# Patient Record
Sex: Female | Born: 1995 | Race: White | Hispanic: No | Smoking: Never smoker
Health system: Southern US, Community
[De-identification: ages and names within clinical notes are randomized; demographics above are authoritative.]

## PROBLEM LIST (undated history)

## (undated) DIAGNOSIS — N200 Calculus of kidney: Secondary | ICD-10-CM

## (undated) DIAGNOSIS — F319 Bipolar disorder, unspecified: Secondary | ICD-10-CM

## (undated) DIAGNOSIS — I499 Cardiac arrhythmia, unspecified: Secondary | ICD-10-CM

## (undated) HISTORY — DX: Bipolar disorder, unspecified: F31.9

---
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
## 2016-01-05 ENCOUNTER — Encounter: Payer: Self-pay | Admitting: Family Medicine

## 2016-01-05 ENCOUNTER — Ambulatory Visit (INDEPENDENT_AMBULATORY_CARE_PROVIDER_SITE_OTHER): Payer: BLUE CROSS/BLUE SHIELD | Admitting: Family Medicine

## 2016-01-05 VITALS — BP 116/65 | HR 92 | Resp 16 | Wt 270.0 lb

## 2016-01-05 DIAGNOSIS — I491 Atrial premature depolarization: Secondary | ICD-10-CM

## 2016-01-05 DIAGNOSIS — G4700 Insomnia, unspecified: Secondary | ICD-10-CM

## 2016-01-05 DIAGNOSIS — F411 Generalized anxiety disorder: Secondary | ICD-10-CM

## 2016-01-05 DIAGNOSIS — I493 Ventricular premature depolarization: Secondary | ICD-10-CM

## 2016-01-05 DIAGNOSIS — F339 Major depressive disorder, recurrent, unspecified: Secondary | ICD-10-CM | POA: Diagnosis not present

## 2016-01-05 MED ORDER — ALPRAZOLAM 0.5 MG PO TABS: 0.2500 mg | ORAL_TABLET | Freq: Two times a day (BID) | ORAL | 1 refills | Status: DC | PRN

## 2016-01-05 MED ORDER — METOPROLOL SUCCINATE ER 25 MG PO TB24: 25.0000 mg | ORAL_TABLET | Freq: Every day | ORAL | 1 refills | Status: DC

## 2016-01-05 MED ORDER — SUVOREXANT 15 MG PO TABS: 15.0000 mg | ORAL_TABLET | Freq: Every day | ORAL | 1 refills | Status: DC

## 2016-01-05 MED ORDER — ESCITALOPRAM OXALATE 10 MG PO TABS: 10.0000 mg | ORAL_TABLET | Freq: Every day | ORAL | 1 refills | Status: DC

## 2016-01-05 NOTE — Progress Notes (Addendum)
Subjective:    CC: F/U of depression/anxiety, 3 mo  HPI:  Depression/anxiety-PHQ 9 score of 5 today, previous of 10. GAD 7 score 4, previous of 9. Rates her symptoms as not difficult at all. He is currently on Lexapro. We switched her back in April. She does still use alprazolam occasionally as needed for panic attacks.   Insomnia - tried trazodone in the past and had side effects. She is actually doing well on the Belsomra. She says occasionally she'll still have a night where she doesn't sleep well. She is still consuming some caffeine and has not completely gotten rid of it.  She still having some frequent palpitations. She said she may go week and it doesn't bother her and it seems to recur multiple times throughout the day for couple of days. The symptoms are most seem to cluster. She says it then amiss feels like a pressure in her chest..  She did want me to check her ears and throat. She actually [facility] and was treated for sinus infection and right ear infection. Unfortunately she did not read the instructions correctly on the bottle so was only taking her amoxicillin once a day instead of twice a day. She actually is feeling much better but just wanted me to make sure that everything looks like it's improving.  Past medical history, Surgical history, Family history not pertinant except as noted below, Social history, Allergies, and medications have been entered into the medical record, reviewed, and corrections made.   Review of Systems: No fevers, chills, night sweats, weight loss, chest pain, or shortness of breath.   Objective:    General: Well Developed, well nourished, and in no acute distress.  Neuro: Alert and oriented x3, extra-ocular muscles intact, sensation grossly intact.  HEENT: Normocephalic, atraumatic, OP is clear, TMs and canals are clear bilaterally that she is missing her light reflex on the right tympanic membrane. No cervical  lymphadenopathy. Skin: Warm and dry, no rashes. Cardiac: Regular rate and rhythm, no murmurs rubs or gallops, no lower extremity edema.  Respiratory: Clear to auscultation bilaterally. Not using accessory muscles, speaking in full sentences.   Impression and Recommendations:   Depression/Anxiety - encouraged her to get established with a Counselor/therapist when she gets up to the [employer] in a couple weeks to start college. I think this will be helpful for her throughout the year. Continue with Lexapro. Strongly encouraged her to consider regular meditation. She says she is going to take a yoga class this fall. GAD-7 score of 4, down from 9.  PHQ-9 score 5, down from 10.    Insomnia - continue with all summer. Reminded her once again to try avoiding all for caffeine. Encouraged her to give it a trial for at least a couple weeks to see if she notices a difference.  Palpitations-we'll start metoprolol 25 mg daily. Discussed potential risks and benefits of the medication including side effects. Follow-up when she is back home for winter break.  [place] at [employer].

## 2016-02-13 DIAGNOSIS — R05 Cough: Secondary | ICD-10-CM | POA: Diagnosis not present

## 2016-02-20 ENCOUNTER — Ambulatory Visit (INDEPENDENT_AMBULATORY_CARE_PROVIDER_SITE_OTHER): Payer: BLUE CROSS/BLUE SHIELD | Admitting: Osteopathic Medicine

## 2016-02-20 ENCOUNTER — Encounter: Payer: Self-pay | Admitting: Osteopathic Medicine

## 2016-02-20 VITALS — BP 136/76 | HR 83 | Temp 98.6°F | Ht 66.0 in | Wt 272.0 lb

## 2016-02-20 DIAGNOSIS — J0100 Acute maxillary sinusitis, unspecified: Secondary | ICD-10-CM | POA: Diagnosis not present

## 2016-02-20 MED ORDER — DOXYCYCLINE MONOHYDRATE 50 MG PO CAPS: 100.0000 mg | ORAL_CAPSULE | Freq: Two times a day (BID) | ORAL | 0 refills | Status: DC

## 2016-02-20 NOTE — Progress Notes (Signed)
HPI: Tonya EdwardsMichaela Clark is a 20 y.o. female who [facility] [place]  today for chief complaint of:  Chief Complaint  Patient presents with  . Cough    Acute Illness: . Context: stuffy nose, coming and going now rather than conctant. Hurts in face and teeth. Coughing maybe a bit more but dry cough . Assoc signs/symptoms: see ROS . Duration: 3.5 weeks . Modifying factors: has tried the following OTC/Rx medications: saw [facility] 3 times: first time antibiotics and nasal spray for sinuses, different antibiotics again a week later, then a week later prednisone, then CXR and breathing treatment and she is  alittle better but really congested   Past medical, social and family history reviewed. Current medications and allergies reviewed.     Review of Systems:  Constitutional: no fever/chills  HEENT: yes headache, no vision change or hearing change, some sore throat  Cardiovascular: No chest pain, No pressure/palpitations  Respiratory: yes cough, no shortness of breath  Gastrointestinal: no nausea, no vomiting, no abdominal pain, no diarrhea  Musculoskeletal: no myalgia/arthralgia  Skin/Integument: no rash   Exam:  BP 136/76   Pulse 83   Temp 98.6 F (37 C) (Oral)   Ht 5\' 6"  (1.676 m)   Wt 272 lb (123.4 kg)   BMI 43.90 kg/m   Constitutional: VSS, see above. General Appearance: alert, well-developed, well-nourished, NAD  Eyes: Normal lids and conjunctive, non-icteric sclera, PERRLA  Ears, Nose, Mouth, Throat: Normal external inspection ears/nares/mouth/lips/gums, normal TM, MMM; posterior pharynx without erythema, without exudate, nasal mucosa normal  Neck: No masses, trachea midline. normal lymph nodes  Respiratory: Normal respiratory effort. No  wheeze/rhonchi/rales  Cardiovascular: S1/S2 normal, no murmur/rub/gallop auscultated. RRR.   ASSESSMENT/PLAN: It sounds like she went to student health pretty early in the onset of a  viral illness, Augmentin probably wouldn't have been helpful, my guess is that she developed a bacterial sinus infection that wasn't covered by the Z-Pak she was given. Will go ahead and escalate antibiotics at this point, if still no improvement would consider CT head. Cough most likely post viral illness, can also be postnasal drip. Advised Afrin nasal spray limited use, throat lozenges with benzocaine/menthol.   Acute maxillary sinusitis, recurrence not specified   Patient Instructions  Take all antibiotics as directed. If no better in 5 - 7 days, or if you get worse before then, please call us!       Visit summary was printed for the patient with medications and pertinent instructions for patient to review. ER/RTC precautions reviewed. All questions answered. No Follow-up on file.

## 2016-02-20 NOTE — Patient Instructions (Signed)
Take all antibiotics as directed. If no better in 5 - 7 days, or if you get worse before then, please call us!

## 2016-02-27 DIAGNOSIS — F331 Major depressive disorder, recurrent, moderate: Secondary | ICD-10-CM | POA: Diagnosis not present

## 2016-02-27 DIAGNOSIS — F411 Generalized anxiety disorder: Secondary | ICD-10-CM | POA: Diagnosis not present

## 2016-03-09 DIAGNOSIS — F331 Major depressive disorder, recurrent, moderate: Secondary | ICD-10-CM | POA: Diagnosis not present

## 2016-03-09 DIAGNOSIS — F411 Generalized anxiety disorder: Secondary | ICD-10-CM | POA: Diagnosis not present

## 2016-03-16 DIAGNOSIS — F331 Major depressive disorder, recurrent, moderate: Secondary | ICD-10-CM | POA: Diagnosis not present

## 2016-03-16 DIAGNOSIS — F411 Generalized anxiety disorder: Secondary | ICD-10-CM | POA: Diagnosis not present

## 2016-03-23 DIAGNOSIS — F411 Generalized anxiety disorder: Secondary | ICD-10-CM | POA: Diagnosis not present

## 2016-03-23 DIAGNOSIS — F331 Major depressive disorder, recurrent, moderate: Secondary | ICD-10-CM | POA: Diagnosis not present

## 2016-03-30 DIAGNOSIS — F331 Major depressive disorder, recurrent, moderate: Secondary | ICD-10-CM | POA: Diagnosis not present

## 2016-03-30 DIAGNOSIS — F411 Generalized anxiety disorder: Secondary | ICD-10-CM | POA: Diagnosis not present

## 2016-04-06 DIAGNOSIS — F411 Generalized anxiety disorder: Secondary | ICD-10-CM | POA: Diagnosis not present

## 2016-04-06 DIAGNOSIS — F331 Major depressive disorder, recurrent, moderate: Secondary | ICD-10-CM | POA: Diagnosis not present

## 2016-04-13 DIAGNOSIS — F411 Generalized anxiety disorder: Secondary | ICD-10-CM | POA: Diagnosis not present

## 2016-04-13 DIAGNOSIS — F331 Major depressive disorder, recurrent, moderate: Secondary | ICD-10-CM | POA: Diagnosis not present

## 2016-04-20 DIAGNOSIS — F411 Generalized anxiety disorder: Secondary | ICD-10-CM | POA: Diagnosis not present

## 2016-04-20 DIAGNOSIS — F331 Major depressive disorder, recurrent, moderate: Secondary | ICD-10-CM | POA: Diagnosis not present

## 2016-04-27 DIAGNOSIS — F331 Major depressive disorder, recurrent, moderate: Secondary | ICD-10-CM | POA: Diagnosis not present

## 2016-04-27 DIAGNOSIS — F411 Generalized anxiety disorder: Secondary | ICD-10-CM | POA: Diagnosis not present

## 2016-05-10 ENCOUNTER — Ambulatory Visit: Payer: BLUE CROSS/BLUE SHIELD | Admitting: Family Medicine

## 2016-05-10 ENCOUNTER — Encounter: Payer: Self-pay | Admitting: Family Medicine

## 2016-05-10 ENCOUNTER — Ambulatory Visit (INDEPENDENT_AMBULATORY_CARE_PROVIDER_SITE_OTHER): Payer: BLUE CROSS/BLUE SHIELD | Admitting: Family Medicine

## 2016-05-10 VITALS — BP 133/74 | HR 88 | Wt 278.0 lb

## 2016-05-10 DIAGNOSIS — F339 Major depressive disorder, recurrent, unspecified: Secondary | ICD-10-CM

## 2016-05-10 DIAGNOSIS — G4709 Other insomnia: Secondary | ICD-10-CM | POA: Diagnosis not present

## 2016-05-10 DIAGNOSIS — I493 Ventricular premature depolarization: Secondary | ICD-10-CM

## 2016-05-10 DIAGNOSIS — F411 Generalized anxiety disorder: Secondary | ICD-10-CM

## 2016-05-10 DIAGNOSIS — L918 Other hypertrophic disorders of the skin: Secondary | ICD-10-CM

## 2016-05-10 MED ORDER — ESCITALOPRAM OXALATE 20 MG PO TABS: 20.0000 mg | ORAL_TABLET | Freq: Every day | ORAL | 1 refills | Status: DC

## 2016-05-10 NOTE — Progress Notes (Signed)
Subjective:    CC: GAD  HPI: Four-month follow-up for depression and anxiety-she's currently on Lexapro and has been on it since April of this year. She does use alprazolam as needed for panic attacks. She is in college and being. She says last semester she made A's and B's and 1C and a class. She has been seeing a psychologist there. That she feels like she is a little on. She does admit that she has been having some newer symptoms that she had not previously discussed including some OCD type behaviors. Checking unlocking doors, showering twice a day, eating only certain foods on certain days because she feels like something bad may happen if she eats them on other days. She's also been having some paranoid thoughts that people are after her because she has some knowledge in her head but they do not have. She admits she's occasionally been hearing voices in her brain amiss like they are discussing intubating each other. But she feels like they are not her voices.  Insomnia-still using the Belsomra occasionally. She says it does work very well when she takes it but admits she is not very consistent with it.  Palpitations-we started metoprolol 25 mg 4 months ago.She does feel like it's been helping with her palpitations that she still gets an occasional episode.  The has a skin tag under her left axilla.  She says she squeezed white stuff out of it so wasn't sure if it was really a skin tag or not. She says it's been there since she was little. He normally doesn't bother her.  Past medical history, Surgical history, Family history not pertinant except as noted below, Social history, Allergies, and medications have been entered into the medical record, reviewed, and corrections made.   Review of Systems: No fevers, chills, night sweats, weight loss, chest pain, or shortness of breath.   Objective:    General: Well Developed, well nourished, and in no acute distress.  Neuro: Alert and oriented x3,  extra-ocular muscles intact, sensation grossly intact.  HEENT: Normocephalic, atraumatic  Skin: Warm and dry, no rashes.Under the left axilla she does have a small skin tag. I do see a slightly enlarged pore right next to the skin tag which certainly could be a sebaceous cyst. Cardiac: Regular rate and rhythm, no murmurs rubs or gallops, no lower extremity edema.  Respiratory: Clear to auscultation bilaterally. Not using accessory muscles, speaking in full sentences.   Impression and Recommendations:   Anxiety/Depression - I am concerned now that she is having some more paranoid and OCD type tendencies. I would like to get her in with psychiatry. We discussed this on multiple occasions in the past. She is willing to see Dr. Gilmore LarocheAkhtar downstairs will go ahead and place referral today. For now to try increasing her Lexapro to 20 mg with make which may help some with the OCD but may not help with the paranoid thoughts.  Insomnia - continue Belsomra since it does seem to be effective and is not causing any symptoms.  Palpitations-at this point fairly well controlled with 25 mg metoprolol. But certainly if she feels like she starting to have more frequent breakthrough symptoms we can increase the dose.  Skin tag - he can easily remove this at her convenience. Discussed this with her today.

## 2016-05-27 DIAGNOSIS — M272 Inflammatory conditions of jaws: Secondary | ICD-10-CM | POA: Diagnosis not present

## 2016-05-27 DIAGNOSIS — K0402 Irreversible pulpitis: Secondary | ICD-10-CM | POA: Diagnosis not present

## 2016-06-11 DIAGNOSIS — F411 Generalized anxiety disorder: Secondary | ICD-10-CM | POA: Diagnosis not present

## 2016-06-11 DIAGNOSIS — F422 Mixed obsessional thoughts and acts: Secondary | ICD-10-CM | POA: Diagnosis not present

## 2016-06-11 DIAGNOSIS — F331 Major depressive disorder, recurrent, moderate: Secondary | ICD-10-CM | POA: Diagnosis not present

## 2016-06-17 DIAGNOSIS — M272 Inflammatory conditions of jaws: Secondary | ICD-10-CM | POA: Diagnosis not present

## 2016-06-29 DIAGNOSIS — F331 Major depressive disorder, recurrent, moderate: Secondary | ICD-10-CM | POA: Diagnosis not present

## 2016-06-29 DIAGNOSIS — F422 Mixed obsessional thoughts and acts: Secondary | ICD-10-CM | POA: Diagnosis not present

## 2016-06-29 DIAGNOSIS — F411 Generalized anxiety disorder: Secondary | ICD-10-CM | POA: Diagnosis not present

## 2016-07-07 ENCOUNTER — Other Ambulatory Visit: Payer: Self-pay | Admitting: Family Medicine

## 2016-07-07 DIAGNOSIS — I491 Atrial premature depolarization: Secondary | ICD-10-CM

## 2016-07-07 DIAGNOSIS — I493 Ventricular premature depolarization: Secondary | ICD-10-CM

## 2016-07-11 ENCOUNTER — Other Ambulatory Visit: Payer: Self-pay | Admitting: Family Medicine

## 2016-07-11 DIAGNOSIS — F411 Generalized anxiety disorder: Secondary | ICD-10-CM

## 2016-07-11 DIAGNOSIS — F339 Major depressive disorder, recurrent, unspecified: Secondary | ICD-10-CM

## 2016-07-23 ENCOUNTER — Encounter: Payer: Self-pay | Admitting: Family Medicine

## 2016-07-23 ENCOUNTER — Ambulatory Visit (INDEPENDENT_AMBULATORY_CARE_PROVIDER_SITE_OTHER): Payer: BLUE CROSS/BLUE SHIELD | Admitting: Family Medicine

## 2016-07-23 VITALS — BP 123/53 | HR 78 | Ht 66.0 in | Wt 274.0 lb

## 2016-07-23 DIAGNOSIS — G4709 Other insomnia: Secondary | ICD-10-CM

## 2016-07-23 DIAGNOSIS — I493 Ventricular premature depolarization: Secondary | ICD-10-CM

## 2016-07-23 DIAGNOSIS — F339 Major depressive disorder, recurrent, unspecified: Secondary | ICD-10-CM

## 2016-07-23 DIAGNOSIS — I491 Atrial premature depolarization: Secondary | ICD-10-CM

## 2016-07-23 DIAGNOSIS — F411 Generalized anxiety disorder: Secondary | ICD-10-CM

## 2016-07-23 MED ORDER — FLUCONAZOLE 150 MG PO TABS: 150.0000 mg | ORAL_TABLET | Freq: Once | ORAL | 0 refills | Status: AC

## 2016-07-23 MED ORDER — METOPROLOL SUCCINATE ER 25 MG PO TB24: 25.0000 mg | ORAL_TABLET | Freq: Every day | ORAL | 1 refills | Status: DC

## 2016-07-23 MED ORDER — ESCITALOPRAM OXALATE 20 MG PO TABS: 20.0000 mg | ORAL_TABLET | Freq: Every day | ORAL | 1 refills | Status: DC

## 2016-07-23 NOTE — Progress Notes (Signed)
Subjective:    CC: GAD, MDD  HPI:  F/U GAD/MDD - Overall doing well. 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 I do a lot of community service which she is actually been very excited about. She still taking her medication regularly without any side effects or problems. She's not been sleeping well.  She quit seeing the therapist that she was working with who is very into Astrological signs and treatments for therapy.  Insomnia - She has restarted her Belsomra recently because she hasn't been sleeping well. Typically falling asleep around 3 AM and then getting up at 6 AM. She admits that she does drink a lot of caffeine since she is in college.  PVCs/PACs-she does need a refill on her metoprolol today.  Also c/o of itching and irritation in the groin area x 1.5 weeks.    Past medical history, Surgical history, Family history not pertinant except as noted below, Social history, Allergies, and medications have been entered into the medical record, reviewed, and corrections made.   Review of Systems: No fevers, chills, night sweats, weight loss, chest pain, or shortness of breath.   Objective:    General: Well Developed, well nourished, and in no acute distress.  Neuro: Alert and oriented x3, extra-ocular muscles intact, sensation grossly intact.  HEENT: Normocephalic, atraumatic  Skin: Warm and dry, no rashes. Cardiac: Regular rate and rhythm, no murmurs rubs or gallops, no lower extremity edema.  Respiratory: Clear to auscultation bilaterally. Not using accessory muscles, speaking in full sentences.   Impression and Recommendations:    GAD - Mood is well controlled. Continue current regimen. Follow-up in 4 months. Refills sent to pharmacy.Encouraged her to check with her insurance and find a new there past in the area in between where she lives. She has any problems please let me know if she needs an actual referral please let me know. I think this is very helpful for her in the past  and like to get her hooked in with someone that she can talk to when she is undergoing a lot of stress.  MDD - the note above.  PHQ 9 score of 10 today.  Insomnia - Continue with Belsomra. Also discussed completely eliminating caffeine from the diet. She did start with cutting back and see if this makes a difference.  PACs - refilled metoprolol today.

## 2016-07-29 ENCOUNTER — Telehealth: Payer: Self-pay | Admitting: *Deleted

## 2016-07-29 NOTE — Telephone Encounter (Signed)
Pt called and stated that the diflucan did not provide her w/any relief and is asking for something else to be sent. She stated that she is going back to school. Will fwd to pcp for advice.[name], Yuridia Couts McConnell AFBLynetta

## 2016-07-29 NOTE — Telephone Encounter (Signed)
Called pt and informed her that she would need to come in to do a self wet prep. She stated that she was going to try something OTC. I informed her that in order to get a prescription from Dr. Kandra NicolasMetneney she would have to come in to have this done. She voiced understanding .[name], Aveena Bari McDonaldLynetta

## 2016-07-29 NOTE — Telephone Encounter (Signed)
Ok , then needs nurse visit or go to lab for wet prep sample.

## 2016-08-05 DIAGNOSIS — W19XXXA Unspecified fall, initial encounter: Secondary | ICD-10-CM | POA: Diagnosis not present

## 2016-08-05 DIAGNOSIS — S93401A Sprain of unspecified ligament of right ankle, initial encounter: Secondary | ICD-10-CM | POA: Diagnosis not present

## 2016-08-05 DIAGNOSIS — M25571 Pain in right ankle and joints of right foot: Secondary | ICD-10-CM | POA: Diagnosis not present

## 2016-08-13 ENCOUNTER — Other Ambulatory Visit: Payer: Self-pay | Admitting: Family Medicine

## 2016-08-13 DIAGNOSIS — G4700 Insomnia, unspecified: Secondary | ICD-10-CM

## 2016-08-26 DIAGNOSIS — R1031 Right lower quadrant pain: Secondary | ICD-10-CM | POA: Diagnosis not present

## 2016-08-26 DIAGNOSIS — N2889 Other specified disorders of kidney and ureter: Secondary | ICD-10-CM | POA: Diagnosis not present

## 2016-08-26 DIAGNOSIS — N132 Hydronephrosis with renal and ureteral calculous obstruction: Secondary | ICD-10-CM | POA: Diagnosis not present

## 2016-08-26 DIAGNOSIS — F329 Major depressive disorder, single episode, unspecified: Secondary | ICD-10-CM | POA: Diagnosis not present

## 2016-08-26 DIAGNOSIS — N139 Obstructive and reflux uropathy, unspecified: Secondary | ICD-10-CM | POA: Diagnosis not present

## 2016-08-26 DIAGNOSIS — Z9889 Other specified postprocedural states: Secondary | ICD-10-CM | POA: Diagnosis not present

## 2016-08-26 DIAGNOSIS — N201 Calculus of ureter: Secondary | ICD-10-CM | POA: Diagnosis not present

## 2016-08-26 DIAGNOSIS — N23 Unspecified renal colic: Secondary | ICD-10-CM | POA: Diagnosis not present

## 2016-08-31 DIAGNOSIS — N201 Calculus of ureter: Secondary | ICD-10-CM | POA: Diagnosis not present

## 2016-09-06 DIAGNOSIS — N200 Calculus of kidney: Secondary | ICD-10-CM | POA: Diagnosis not present

## 2016-09-23 ENCOUNTER — Encounter: Payer: Self-pay | Admitting: Family Medicine

## 2016-09-23 ENCOUNTER — Ambulatory Visit (INDEPENDENT_AMBULATORY_CARE_PROVIDER_SITE_OTHER): Payer: BLUE CROSS/BLUE SHIELD

## 2016-09-23 ENCOUNTER — Ambulatory Visit (INDEPENDENT_AMBULATORY_CARE_PROVIDER_SITE_OTHER): Payer: BLUE CROSS/BLUE SHIELD | Admitting: Family Medicine

## 2016-09-23 VITALS — BP 137/81 | HR 90 | Ht 66.0 in | Wt 268.0 lb

## 2016-09-23 DIAGNOSIS — M79671 Pain in right foot: Secondary | ICD-10-CM

## 2016-09-23 DIAGNOSIS — S99911A Unspecified injury of right ankle, initial encounter: Secondary | ICD-10-CM | POA: Diagnosis not present

## 2016-09-23 DIAGNOSIS — S99921A Unspecified injury of right foot, initial encounter: Secondary | ICD-10-CM | POA: Diagnosis not present

## 2016-09-23 DIAGNOSIS — M25571 Pain in right ankle and joints of right foot: Secondary | ICD-10-CM

## 2016-09-23 NOTE — Progress Notes (Signed)
Subjective:    Patient ID: Tonya Clark, female    DOB: [birth year], 21 y.o.   MRN: [id number]  HPI She twisted her ankle while at school on 08/05/16 while stepping out of a van. Her other foot got caught on something in the [place] and then she fell forward.  Went to the ED that night and had a negative xray and given a boot. She wore the boot for a couple of weeks. She was given PT but they only did STem and not any exercises. She was told she could quit wearing the boot but then felt like her pain was actually getting worse so she started wearing it again about a week and a half ago. When she wasn't wearing the boot she was wearing a soft brace. She says is still swelling. Most of her pain is over the outer ankle area and over the distal toes. She says in fact after the injury she had significant bruising all the way across her distal arch over the tops of her toes. They did not do a foot film x-ray.       Review of Systems  BP 137/81   Pulse 90   Ht 5\' 6"  (1.676 m)   Wt 268 lb (121.6 kg)   SpO2 100%   BMI 43.26 kg/m     No Known Allergies  Past Medical History:  Diagnosis Date  . Jaundice, neonatal   . Premature birth    96 weeks    Past Surgical History:  Procedure Laterality Date  . TONSILLECTOMY  2009    Social History   Social History  . Marital status: Single    Spouse name: N/A  . Number of children: N/A  . Years of education: N/A   Occupational History  . Student    Social History Main Topics  . Smoking status: Never Smoker  . Smokeless tobacco: Never Used  . Alcohol use No  . Drug use: No  . Sexual activity: No   Other Topics Concern  . Not on file   Social History Narrative   Was home schooled for HS. She is now working at a [employer] 2-3 times per week. Born in [place].     Family History  Problem Relation Age of Onset  . Hypertension Mother   . Anxiety disorder Father   . Hypertension Father   . Allergies Mother     Outpatient  Encounter Prescriptions as of 09/23/2016  Medication Sig  . ALPRAZolam (XANAX) 0.5 MG tablet Take 0.5-1 tablets (0.25-0.5 mg total) by mouth 2 (two) times daily as needed for anxiety.  . BELSOMRA 15 MG TABS TAKE 1 TABLET BY MOUTH EVERY DAY AT BEDTIME  . escitalopram (LEXAPRO) 20 MG tablet Take 1 tablet (20 mg total) by mouth at bedtime.  . metoprolol succinate (TOPROL-XL) 25 MG 24 hr tablet Take 1 tablet (25 mg total) by mouth daily.   No facility-administered encounter medications on file as of 09/23/2016.          Objective:   Physical Exam  Constitutional: She is oriented to person, place, and time. She appears well-developed and well-nourished.  HENT:  Head: Normocephalic and atraumatic.  Eyes: Conjunctivae and EOM are normal.  Cardiovascular: Normal rate.   Pulmonary/Chest: Effort normal.  Musculoskeletal:  Right ankle is noticeably swollen compared to her left. She is tender just below the lateral malleolus. And she is tender almost completely around the medial malleolus. Nontender across the talus. She is  also very tender across the first, second, third and fourth distal metatarsal heads. She has normal range of motion and strength in all directions. No increased laxity with anterior drawer.  Neurological: She is alert and oriented to person, place, and time.  Skin: Skin is dry. No pallor.  Psychiatric: She has a normal mood and affect. Her behavior is normal.  Vitals reviewed.       Assessment & Plan:  Right ankle pain - Suspect severe strain though she could have a fracture. Initial film was negative. Recommend repeat x-ray today because she still having persistent pain 6 weeks out and having a lot of difficulty walking on it. If it's negative then recommend formal physical therapy and recommend a little bit more structured brace such as an ASO instead of a soft brace.  Right foot pain  - and very concerned that she is extremely tender over her first, second, and third distal  metatarsal heads and that she has significant bruising. She says it's still painful to walk on that part of her foot. We'll get x-rays today to evaluate for possible fracture. She might have some constipation showing some areas of previous fracture.

## 2016-09-24 ENCOUNTER — Ambulatory Visit (INDEPENDENT_AMBULATORY_CARE_PROVIDER_SITE_OTHER): Payer: BLUE CROSS/BLUE SHIELD | Admitting: Family Medicine

## 2016-09-24 VITALS — BP 131/71 | HR 97

## 2016-09-24 DIAGNOSIS — M25571 Pain in right ankle and joints of right foot: Secondary | ICD-10-CM | POA: Diagnosis not present

## 2016-09-24 NOTE — Progress Notes (Signed)
Pt came into clinic today to be fitted for right ankle brace. Pt did question having some ROM in the ankle with the brace, per PCP it is OK for Pt to wear the boot she has at home for times when she has increased walking. Pt verbalized understanding. She will be back in town on Tuesday (09/28/16) from College, so will be able to get set up for PT after that. DME forms completed.

## 2016-09-24 NOTE — Progress Notes (Signed)
Agree with above.  Culver Feighner, MD  

## 2016-10-07 ENCOUNTER — Encounter: Payer: Self-pay | Admitting: Family Medicine

## 2016-10-07 ENCOUNTER — Ambulatory Visit (INDEPENDENT_AMBULATORY_CARE_PROVIDER_SITE_OTHER): Payer: BLUE CROSS/BLUE SHIELD | Admitting: Family Medicine

## 2016-10-07 VITALS — BP 138/72 | HR 105 | Ht 66.0 in | Wt 273.0 lb

## 2016-10-07 DIAGNOSIS — M25571 Pain in right ankle and joints of right foot: Secondary | ICD-10-CM | POA: Diagnosis not present

## 2016-10-07 DIAGNOSIS — S93401D Sprain of unspecified ligament of right ankle, subsequent encounter: Secondary | ICD-10-CM | POA: Diagnosis not present

## 2016-10-07 NOTE — Progress Notes (Signed)
   Subjective:    Patient ID: Tonya Clark, female    DOB: 03-11-1996, 21 y.o.   MRN: [id number]  HPI 21 year old female here today to follow-up on right ankle pain. The initial injury was on March 15 when she stepped out of her [employer] and twisted her ankle. Now, 8 weeks later she is about 50% improved. She still getting a lot of swelling. She did get a new job recently where she's on her feet for hours at a time. She is not really wearing good supportive shoe wear. She has been wearing thin TOMs.  She is wearing a soft lace up brace for support. She does feel like it's helpful and supportive. Couple days ago she says it was very painful after she got off work. She's been icing it as needed. She has been wearing an ASO. He has been doing her range of motion motion exercises by drawing the alphabet with her foot.   Review of Systems     Objective:   Physical Exam  Constitutional: She is oriented to person, place, and time. She appears well-developed and well-nourished.  HENT:  Head: Normocephalic and atraumatic.  Eyes: Conjunctivae and EOM are normal.  Cardiovascular: Normal rate.   Pulmonary/Chest: Effort normal.  Musculoskeletal:  Her right ankle is still swollen significantly on the lateral side. She is nontender over the ankle joint itself. She does have pain with anterior drawer test. No significant discomfort with inversion or eversion.  Neurological: She is alert and oriented to person, place, and time.  Skin: Skin is dry. No pallor.  Psychiatric: She has a normal mood and affect. Her behavior is normal.  Vitals reviewed.       Assessment & Plan:  Right ankle sprain - He is on 50% improved after 8 weeks. See if I could put her in an Aircast which would be a little bit more hard and supportive but unfortunately we did not have any today. We'll continue using the ASO for now.  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 I'll have her follow-up in 3 weeks with one of our sports medicine doctors especially if she's not improving at that point in time. She is still having a fair amount discomfort with anterior drawer test. Continue to ice as needed. Continue range of motion exercises.

## 2016-10-15 ENCOUNTER — Telehealth: Payer: Self-pay

## 2016-10-15 ENCOUNTER — Ambulatory Visit (INDEPENDENT_AMBULATORY_CARE_PROVIDER_SITE_OTHER): Payer: BLUE CROSS/BLUE SHIELD | Admitting: Sports Medicine

## 2016-10-15 DIAGNOSIS — S93401A Sprain of unspecified ligament of right ankle, initial encounter: Secondary | ICD-10-CM | POA: Insufficient documentation

## 2016-10-15 DIAGNOSIS — G8929 Other chronic pain: Secondary | ICD-10-CM | POA: Diagnosis not present

## 2016-10-15 DIAGNOSIS — M25571 Pain in right ankle and joints of right foot: Secondary | ICD-10-CM | POA: Diagnosis not present

## 2016-10-15 MED ORDER — MELOXICAM 15 MG PO TABS: ORAL_TABLET | ORAL | 3 refills | Status: DC

## 2016-10-15 MED ORDER — DIAZEPAM 5 MG PO TABS: ORAL_TABLET | ORAL | 0 refills | Status: DC

## 2016-10-15 NOTE — Assessment & Plan Note (Deleted)
Acute and exercised induced. Needs to wear supportive shoes, adding meloxicam, rehabilitation exercises. Return in 2 weeks, injection if no better.

## 2016-10-15 NOTE — Telephone Encounter (Signed)
Patient scheduled.

## 2016-10-15 NOTE — Telephone Encounter (Signed)
Let get in with sport med.

## 2016-10-15 NOTE — Assessment & Plan Note (Signed)
At this point she's had two and a half months of pain localized over the ATFL, suspect grade 3 injury. She will go back into a boot that she has at home and we are going to obtain an MRI, I'm adding meloxicam. She will return to see me for MRI results and we will probably put her into a full cast with a walker. Valium for preprocedural anxiolysis

## 2016-10-15 NOTE — Telephone Encounter (Signed)
Unable to reach patient.  No answer.

## 2016-10-15 NOTE — Telephone Encounter (Signed)
Tonya Clark called and states she has more swelling on her ankle. She has been wearing the brace, sportive shoes and applying ice. She still has the boot and we did receive an aircast from [employer]. What would you recommend? Please advise.

## 2016-10-15 NOTE — Progress Notes (Signed)
   Subjective:    I'm seeing this patient as a consultation for:  Dr. 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  She had immediate pain, swelling, bruising, she has several x-rays that were negative for fracture, was placed in a boot which she had meager compliance in. She has been doing rehabilitation exercises but has been working and walking. Unfortunately she continues to have pain, swelling over the lateral ankle, no mechanical symptoms.  Past medical history:  Negative.  See flowsheet/record as well for more information.  Surgical history: Negative.  See flowsheet/record as well for more information.  Family history: Negative.  See flowsheet/record as well for more information.  Social history: Negative.  See flowsheet/record as well for more information.  Allergies, and medications have been entered into the medical record, reviewed, and no changes needed.   Review of Systems: No headache, visual changes, nausea, vomiting, diarrhea, constipation, dizziness, abdominal pain, skin rash, fevers, chills, night sweats, weight loss, swollen lymph nodes, body aches, joint swelling, muscle aches, chest pain, shortness of breath, mood changes, visual or auditory hallucinations.   Objective:   General: Well Developed, well nourished, and in no acute distress.  Neuro/Psych: Alert and oriented x3, extra-ocular muscles intact, able to move all 4 extremities, sensation grossly intact. Skin: Warm and dry, no rashes noted.  Respiratory: Not using accessory muscles, speaking in full sentences, trachea midline.  Cardiovascular: Pulses palpable, no extremity edema. Abdomen: Does not appear distended. Right Ankle: Swollen with tenderness over the ATFL. Anterior drawer is slightly loose with reproduction of pain. Range of motion is full in all directions. Strength is 5/5 in all  directions. Stable lateral and medial ligaments; squeeze test and kleiger test unremarkable; Talar dome nontender; No pain at base of 5th MT; No tenderness over cuboid; No tenderness over N spot or navicular prominence No tenderness on posterior aspects of lateral and medial malleolus No sign of peroneal tendon subluxations; Negative tarsal tunnel tinel's Able to walk 4 steps.  Impression and Recommendations:   This case required medical decision making of moderate complexity.  Plantar fasciitis, left Acute and exercised induced. Needs to wear supportive shoes, adding meloxicam, rehabilitation exercises. Return in 2 weeks, injection if no better.

## 2016-10-25 ENCOUNTER — Ambulatory Visit (INDEPENDENT_AMBULATORY_CARE_PROVIDER_SITE_OTHER): Payer: BLUE CROSS/BLUE SHIELD

## 2016-10-25 DIAGNOSIS — M25471 Effusion, right ankle: Secondary | ICD-10-CM | POA: Diagnosis not present

## 2016-10-25 DIAGNOSIS — S93491A Sprain of other ligament of right ankle, initial encounter: Secondary | ICD-10-CM | POA: Diagnosis not present

## 2016-10-25 DIAGNOSIS — M25741 Osteophyte, right hand: Secondary | ICD-10-CM

## 2016-10-25 DIAGNOSIS — X501XXA Overexertion from prolonged static or awkward postures, initial encounter: Secondary | ICD-10-CM | POA: Diagnosis not present

## 2016-10-26 ENCOUNTER — Telehealth: Payer: Self-pay

## 2016-10-26 NOTE — Telephone Encounter (Signed)
She would have to drive with her left foot.  Minimal weight bearing but I can make it a walking case.  If she can do mostly seated work then no restrictions there.

## 2016-10-26 NOTE — Telephone Encounter (Signed)
Pt has an appointment with you tomorrow and states she's supposed to get a cast. Would like to know what type of restrictions she will have (can she drive, work restrictions, will she be non-weight bearing). Please advise.

## 2016-10-26 NOTE — Telephone Encounter (Signed)
Left VM with information.  

## 2016-10-27 ENCOUNTER — Encounter: Payer: Self-pay | Admitting: Sports Medicine

## 2016-10-27 ENCOUNTER — Ambulatory Visit (INDEPENDENT_AMBULATORY_CARE_PROVIDER_SITE_OTHER): Payer: BLUE CROSS/BLUE SHIELD | Admitting: Sports Medicine

## 2016-10-27 DIAGNOSIS — M25571 Pain in right ankle and joints of right foot: Secondary | ICD-10-CM

## 2016-10-27 DIAGNOSIS — G8929 Other chronic pain: Secondary | ICD-10-CM

## 2016-10-27 NOTE — Assessment & Plan Note (Signed)
ATFL sprain and joint effusion, injection as above, short leg walking cast placed. Return in 2 weeks. If still has some pain we will keep the cast on for another week.

## 2016-10-27 NOTE — Progress Notes (Signed)
  Subjective:    CC: Follow-up  HPI: Right ankle: Persistent pain after an injury 3 months ago, MRI ended up showing ATFL sprain as well as tibiotalar effusion. Some contusions in the talus and the tibia themselves. Pain is persistent in spite of nonweightbearing, and she admits to questionable compliance in her boot.  Past medical history:  Negative.  See flowsheet/record as well for more information.  Surgical history: Negative.  See flowsheet/record as well for more information.  Family history: Negative.  See flowsheet/record as well for more information.  Social history: Negative.  See flowsheet/record as well for more information.  Allergies, and medications have been entered into the medical record, reviewed, and no changes needed.   Review of Systems: No fevers, chills, night sweats, weight loss, chest pain, or shortness of breath.   Objective:    General: Well Developed, well nourished, and in no acute distress.  Neuro: Alert and oriented x3, extra-ocular muscles intact, sensation grossly intact.  HEENT: Normocephalic, atraumatic, pupils equal round reactive to light, neck supple, no masses, no lymphadenopathy, thyroid nonpalpable.  Skin: Warm and dry, no rashes. Cardiac: Regular rate and rhythm, no murmurs rubs or gallops, no lower extremity edema.  Respiratory: Clear to auscultation bilaterally. Not using accessory muscles, speaking in full sentences.  Procedure: Real-time Ultrasound Guided Injection of right ankle joint Device: GE Logiq E  Verbal informed consent obtained.  Time-out conducted.  Noted no overlying erythema, induration, or other signs of local infection.  Skin prepped in a sterile fashion.  Local anesthesia: Topical Ethyl chloride.  With sterile technique and under real time ultrasound guidance:  25-gauge needle advanced between the tibia and the talus and 1 mL Kenalog 40, 1 mL lidocaine, 1 mL bupivacaine injected easily Completed without difficulty  Pain  immediately resolved suggesting accurate placement of the medication.  Advised to call if fevers/chills, erythema, induration, drainage, or persistent bleeding.  Images permanently stored and available for review in the ultrasound unit.  Impression: Technically successful ultrasound guided injection.  Short-leg walking cast placed.  MRI reviewed personally and shows ATFL sprain as well as tibiotalar joint effusion, tibia and talar bone contusions, and mild subtalar joint effusions.  Impression and Recommendations:    Right ankle pain ATFL sprain and joint effusion, injection as above, short leg walking cast placed. Return in 2 weeks. If still has some pain we will keep the cast on for another week.

## 2016-10-28 ENCOUNTER — Institutional Professional Consult (permissible substitution): Payer: BLUE CROSS/BLUE SHIELD | Admitting: Family Medicine

## 2016-11-01 ENCOUNTER — Encounter: Payer: Self-pay | Admitting: Sports Medicine

## 2016-11-01 ENCOUNTER — Ambulatory Visit (INDEPENDENT_AMBULATORY_CARE_PROVIDER_SITE_OTHER): Payer: BLUE CROSS/BLUE SHIELD | Admitting: Sports Medicine

## 2016-11-01 DIAGNOSIS — G8929 Other chronic pain: Secondary | ICD-10-CM

## 2016-11-01 DIAGNOSIS — M25571 Pain in right ankle and joints of right foot: Secondary | ICD-10-CM

## 2016-11-01 NOTE — Progress Notes (Signed)
  Subjective:    CC: Follow-up  HPI: This is a 21 year old female with anxiety and claustrophobia, she was unable to tolerate her short-leg cast and her father cut it off with tin snips.  We did an injection at the last visit which seems to have reduced her swelling, she doesn't endorse any improvement in pain. She is back in a boot, with persistent pain as expected.  Past medical history:  Negative.  See flowsheet/record as well for more information.  Surgical history: Negative.  See flowsheet/record as well for more information.  Family history: Negative.  See flowsheet/record as well for more information.  Social history: Negative.  See flowsheet/record as well for more information.  Allergies, and medications have been entered into the medical record, reviewed, and no changes needed.   Review of Systems: No fevers, chills, night sweats, weight loss, chest pain, or shortness of breath.   Objective:    General: Well Developed, well nourished, and in no acute distress.  Neuro: Alert and oriented x3, extra-ocular muscles intact, sensation grossly intact.  HEENT: Normocephalic, atraumatic, pupils equal round reactive to light, neck supple, no masses, no lymphadenopathy, thyroid nonpalpable.  Skin: Warm and dry, no rashes. Cardiac: Regular rate and rhythm, no murmurs rubs or gallops, no lower extremity edema.  Respiratory: Clear to auscultation bilaterally. Not using accessory muscles, speaking in full sentences. Right Ankle: No visible erythema or swelling. Range of motion is full in all directions. Strength is 5/5 in all directions. Stable lateral and medial ligaments; squeeze test and kleiger test unremarkable; Talar dome nontender; Only minimally tender over the dorsolateral talus No pain at base of 5th MT; No tenderness over cuboid; No tenderness over N spot or navicular prominence No tenderness on posterior aspects of lateral and medial malleolus No sign of peroneal tendon  subluxations; Negative tarsal tunnel tinel's Able to walk 4 steps.  Impression and Recommendations:    Right ankle pain As before MRI showed ATFL sprain a joint effusion, she doesn't feel as though the injection provided much relief however her swelling is significantly better. She had a panic attack in the short-leg cast and removed at home. She is back in the boot, we will give this a try for 2 weeks, including wearing it at night, if she continues to hurt she understands we will place her back into the cast. I have asked her to discuss her uncontrolled anxiety with Dr. Linford ArnoldMetheney. Ultimately we will need a solution for her anxiety and claustrophobia should she have to go back into a cast.  I spent 25 minutes with this patient, greater than 50% was face-to-face time counseling regarding the above diagnoses

## 2016-11-01 NOTE — Assessment & Plan Note (Addendum)
As before MRI showed ATFL sprain a joint effusion, she doesn't feel as though the injection provided much relief however her swelling is significantly better. She had a panic attack in the short-leg cast and removed at home. She is back in the boot, we will give this a try for 2 weeks, including wearing it at night, if she continues to hurt she understands we will place her back into the cast. I have asked her to discuss her uncontrolled anxiety with Dr. Linford ArnoldMetheney. Ultimately we will need a solution for her anxiety and claustrophobia should she have to go back into a cast.

## 2016-11-15 ENCOUNTER — Ambulatory Visit (INDEPENDENT_AMBULATORY_CARE_PROVIDER_SITE_OTHER): Payer: BLUE CROSS/BLUE SHIELD | Admitting: Sports Medicine

## 2016-11-15 ENCOUNTER — Encounter: Payer: Self-pay | Admitting: Sports Medicine

## 2016-11-15 DIAGNOSIS — S93401D Sprain of unspecified ligament of right ankle, subsequent encounter: Secondary | ICD-10-CM | POA: Diagnosis not present

## 2016-11-15 NOTE — Assessment & Plan Note (Signed)
With ATFL edema and bone contusions on the previous MRI. She continues to be extremely anxious, she had a panic attack in the cast and had her father cut it off with tin snips, she continues to be in denial of her uncontrolled anxiety. We had to place her back in the boot at the last visit but she denies any improvement in her pain. She is one of the most difficult patients I have yet seen, mostly due to her mood disorder and refusal to tolerate the appropriate treatment. At this point because she is not willing to proceed further with the recommended treatment plan I have recommended that we just discontinue treatment, discontinue the boot, crutches, she can continue her anti-inflammatory, I am going to put in for some physical therapy and she can just follow up with her PCP.

## 2016-11-15 NOTE — Progress Notes (Signed)
  Subjective:    CC: Follow-up  HPI: Right ankle sprain: We have had a great deal of difficulty getting Tonya Clark to tolerate her treatment plan. She has uncontrolled anxiety but is in denial and refuses to admit it. She does take her Lexapro at 20 mg and occasional alprazolam.  Initially an MRI showed bone contusions and an ATF L sprain, we placed her in a cast after she failed to respond to a short course of conservative measures, she had a panic attack in the cast and cut it off at home. Continues to refuse to admit that her claustrophobia/anxiety is uncontrolled. She refuses to take the alprazolam when she feels her anxiety. Refuses to do another cast, since then she's been in a boot for the past couple of weeks but continues to tell me she has 0 relief in her pain.  At this point she is frustrated, as are we. 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 She tells me this hurts as well even in the boot. At the last visit her ankle actually looked really good, the swelling had resolved after the injection.  Past medical history:  Negative.  See flowsheet/record as well for more information.  Surgical history: Negative.  See flowsheet/record as well for more information.  Family history: Negative.  See flowsheet/record as well for more information.  Social history: Negative.  See flowsheet/record as well for more information.  Allergies, and medications have been entered into the medical record, reviewed, and no changes needed.   Review of Systems: No fevers, chills, night sweats, weight loss, chest pain, or shortness of breath.   Objective:    General: Well Developed, well nourished, and in no acute distress.  Neuro: Alert and oriented x3, extra-ocular muscles intact, sensation grossly intact.  HEENT: Normocephalic, atraumatic, pupils equal round reactive to light, neck supple, no masses, no lymphadenopathy, thyroid  nonpalpable.  Skin: Warm and dry, no rashes. Cardiac: Regular rate and rhythm, no murmurs rubs or gallops, no lower extremity edema.  Respiratory: Clear to auscultation bilaterally. Not using accessory muscles, speaking in full sentences.  Impression and Recommendations:    Moderate right ankle sprain With ATFL edema and bone contusions on the previous MRI. She continues to be extremely anxious, she had a panic attack in the cast and had her father cut it off with tin snips, she continues to be in denial of her uncontrolled anxiety. We had to place her back in the boot at the last visit but she denies any improvement in her pain. She is one of the most difficult patients I have yet seen, mostly due to her mood disorder and refusal to tolerate the appropriate treatment. At this point because she is not willing to proceed further with the recommended treatment plan I have recommended that we just discontinue treatment, discontinue the boot, crutches, she can continue her anti-inflammatory, I am going to put in for some physical therapy and she can just follow up with her PCP.  I spent 25 minutes with this patient, greater than 50% was face-to-face time counseling regarding the above diagnoses

## 2016-11-23 ENCOUNTER — Encounter: Payer: Self-pay | Admitting: Family Medicine

## 2016-11-23 ENCOUNTER — Ambulatory Visit (INDEPENDENT_AMBULATORY_CARE_PROVIDER_SITE_OTHER): Payer: BLUE CROSS/BLUE SHIELD | Admitting: Family Medicine

## 2016-11-23 VITALS — BP 122/70 | HR 79 | Resp 18 | Wt 278.8 lb

## 2016-11-23 DIAGNOSIS — R443 Hallucinations, unspecified: Secondary | ICD-10-CM

## 2016-11-23 DIAGNOSIS — F322 Major depressive disorder, single episode, severe without psychotic features: Secondary | ICD-10-CM | POA: Diagnosis not present

## 2016-11-23 DIAGNOSIS — Z23 Encounter for immunization: Secondary | ICD-10-CM

## 2016-11-23 DIAGNOSIS — F411 Generalized anxiety disorder: Secondary | ICD-10-CM | POA: Diagnosis not present

## 2016-11-23 MED ORDER — ARIPIPRAZOLE 2 MG PO TABS: 2.0000 mg | ORAL_TABLET | Freq: Every day | ORAL | 0 refills | Status: DC

## 2016-11-23 NOTE — Progress Notes (Signed)
Subjective:    Patient ID: Tonya Clark, female    DOB: 28-May-1995, 21 y.o.   MRN: [id number]  HPI Four-month follow-up for generalized anxiety disorder/major depressive disorder-she's currently on Lexapro 20 mg daily and uses alprazolam as needed. She's also using Belsomra 15 mg at bedtime to help with chronic insomnia. Overall she has been suffering from more depression and more anxiety. She actually felt like her anxiety was under really good control until recently when she twisted her ankle and then had kidney stones right before her final exams at college. Then after getting a cast on for her ankle she was becoming so anxious at night that she finally had her dad cut off one night because she was having panic attacks. More recently she has been feeling down. She's been having massive sepsis type thoughts as well. That will take over her mind. For example she will pass a body of water or pollen and just repetitively that she needs to throw her phone and it. Or she'll pass this at a train tracks and think that she needs to stand on it. She's not actually wanting to harm herself but just has the thought of standing on the track repetitively. She does have a history of OCD. She's also been having some more visual hallucinations. She says she'll see shadows out of the corner of her eye and think that it's people looking at her people with cameras. In particular she has recurring image of an old man in the corner for vision. But as soon as she turns around to take a look there is actually nothing there. She has not been having any auditory hallucinations. She is going back to being/college in about one month. August 11.  She admits she does have times where she does feel more happy excited and on top of the world. Usually only last for about half the day or maybe even a whole day but not multiple days. She says occasionally she'll stay up late doing things but it does not persist for more than one  evening.  Review of Systems  BP 122/70   Pulse 79   Resp 18   Wt 278 lb 12.8 oz (126.5 kg)   BMI 45.00 kg/m     No Known Allergies  Past Medical History:  Diagnosis Date  . Jaundice, neonatal   . Premature birth    69 weeks    Past Surgical History:  Procedure Laterality Date  . TONSILLECTOMY  2009    Social History   Social History  . Marital status: Single    Spouse name: N/A  . Number of children: N/A  . Years of education: N/A   Occupational History  . Student    Social History Main Topics  . Smoking status: Never Smoker  . Smokeless tobacco: Never Used  . Alcohol use No  . Drug use: No  . Sexual activity: No   Other Topics Concern  . Not on file   Social History Narrative   Was home schooled for HS. She is now working at a [employer] 2-3 times per week. Born in [place].     Family History  Problem Relation Age of Onset  . Hypertension Mother   . Anxiety disorder Father   . Hypertension Father   . Allergies Mother     Outpatient Encounter Prescriptions as of 11/23/2016  Medication Sig  . ALPRAZolam (XANAX) 0.5 MG tablet Take 0.5-1 tablets (0.25-0.5 mg total) by mouth 2 (  two) times daily as needed for anxiety.  . BELSOMRA 15 MG TABS TAKE 1 TABLET BY MOUTH EVERY DAY AT BEDTIME  . escitalopram (LEXAPRO) 20 MG tablet Take 1 tablet (20 mg total) by mouth at bedtime.  . meloxicam (MOBIC) 15 MG tablet One tab PO qAM with breakfast for 2 weeks, then daily prn pain.  . metoprolol succinate (TOPROL-XL) 25 MG 24 hr tablet Take 1 tablet (25 mg total) by mouth daily.  . ARIPiprazole (ABILIFY) 2 MG tablet Take 1 tablet (2 mg total) by mouth daily. OK to increase to 2 tabs after 10 days if needed.   No facility-administered encounter medications on file as of 11/23/2016.          Objective:   Physical Exam  Constitutional: She is oriented to person, place, and time. She appears well-developed and well-nourished.  HENT:  Head: Normocephalic  and atraumatic.  Eyes: Conjunctivae and EOM are normal.  Cardiovascular: Normal rate.   Pulmonary/Chest: Effort normal.  Neurological: She is alert and oriented to person, place, and time.  Skin: Skin is dry. No pallor.  Psychiatric: She has a normal mood and affect. Her behavior is normal.  Vitals reviewed.         Assessment & Plan:  GAD/MDD - We discussed options. Based on her prescription and if her symptoms currently I think she would actually benefit more from a mood stabilizer. She is actually tried about a total of 5 or 6 SSRIs/S NRI's. They do seem to benefit in some way but don't completely help control her symptoms. She would like to start working with a therapist again. But [facility] [employer] [facility] [employer] in about a month and wants to wait to try to find someone there. Actually encouraged her to try to find a psychiatrist who can also help manage her medications and also help formalize her diagnosis. I do not think she is bipolar. Follow-up in 4 weeks before she returns to school. We'll start with Abilify. Warned about potential for weight gain. Consider checking an A1c when I see her back.  Time spent is 30 min, > 50% spent counseling about her mood.    Tdap given today.

## 2016-11-26 DIAGNOSIS — M25571 Pain in right ankle and joints of right foot: Secondary | ICD-10-CM | POA: Diagnosis not present

## 2016-11-29 ENCOUNTER — Encounter: Payer: Self-pay | Admitting: Physical Therapy

## 2016-11-29 ENCOUNTER — Ambulatory Visit (INDEPENDENT_AMBULATORY_CARE_PROVIDER_SITE_OTHER): Payer: BLUE CROSS/BLUE SHIELD | Admitting: Physical Therapy

## 2016-11-29 DIAGNOSIS — R2241 Localized swelling, mass and lump, right lower limb: Secondary | ICD-10-CM | POA: Diagnosis not present

## 2016-11-29 DIAGNOSIS — M6281 Muscle weakness (generalized): Secondary | ICD-10-CM

## 2016-11-29 DIAGNOSIS — M25571 Pain in right ankle and joints of right foot: Secondary | ICD-10-CM

## 2016-11-29 DIAGNOSIS — M25671 Stiffness of right ankle, not elsewhere classified: Secondary | ICD-10-CM

## 2016-11-29 NOTE — Patient Instructions (Addendum)
ROM: Plantar / Dorsiflexion    With right leg relaxed, gently flex and extend ankle. Move through full range of motion. Avoid pain. Repeat _10___ times per set. Do __1__ sets per session. Do _1___ sessions per day.  Ankle Circles    Slowly rotate right foot and ankle clockwise then counterclockwise. Gradually increase range of motion. Avoid pain. Circle __10__ times each direction per set. Do __1__ sets per session. Do _1__ sessions per day.   Gastroc Stretch    Stand with right foot back, leg straight, forward leg bent. Keeping heel on floor, turned slightly out, lean into wall until stretch is felt in calf. Hold __30__ seconds. Repeat __1__ times per set. Do __1__ sets per session. Do __1__ sessions per day. Repeat on the left side.   Soleus Stretch    Stand with right foot back, both knees bent. Keeping heel on floor, turned slightly out, lean into wall until stretch is felt in lower calf. Hold __30__ seconds. Repeat ___1_ times per set. Do __1__ sets per session. Do __1__ sessions per day. Repeat on the left side.  Balance: Three-Way Leg Swing   1 Stand on right foot, hands on hips. Reach other foot forward __1_ times, sideways _1___ times, back __1__ times. Hold each position __1__ seconds. Relax. Repeat __10__ times per set. Do ___2_ sets per session. Do __1__ sessions per day.   Trigger Point Dry Needling  . What is Trigger Point Dry Needling (DN)? o DN is a physical therapy technique used to treat muscle pain and dysfunction. Specifically, DN helps deactivate muscle trigger points (muscle knots).  o A thin filiform needle is used to penetrate the skin and stimulate the underlying trigger point. The goal is for a local twitch response (LTR) to occur and for the trigger point to relax. No medication of any kind is injected during the procedure.   . What Does Trigger Point Dry Needling Feel Like?  o The procedure feels different for each individual patient. Some  patients report that they do not actually feel the needle enter the skin and overall the process is not painful. Very mild bleeding may occur. However, many patients feel a deep cramping in the muscle in which the needle was inserted. This is the local twitch response.   Marland Kitchen. How Will I feel after the treatment? o Soreness is normal, and the onset of soreness may not occur for a few hours. Typically this soreness does not last longer than two days.  o Bruising is uncommon, however; ice can be used to decrease any possible bruising.  o In rare cases feeling tired or nauseous after the treatment is normal. In addition, your symptoms may get worse before they get better, this period will typically not last longer than 24 hours.   . What Can I do After My Treatment? o Increase your hydration by drinking more water for the next 24 hours. o You may place ice or heat on the areas treated that have become sore, however, do not use heat on inflamed or bruised areas. Heat often brings more relief post needling. o You can continue your regular activities, but vigorous activity is not recommended initially after the treatment for 24 hours. o DN is best combined with other physical therapy such as strengthening, stretching, and other therapies.

## 2016-11-29 NOTE — Therapy (Signed)
[facility] [address] [place], [place], [address] Phone: [phone]   Fax:  [phone]  Physical Therapy Evaluation  Patient Details  Name: Tonya EdwardsMichaela Olenik MRN: [id number] Date of Birth: 1996/02/10 Referring Provider: Dr Benjamin Stainhekkekandam  Encounter Date: 11/29/2016      PT End of Session - 11/29/16 1116    Visit Number 1   Number of Visits 8   Date for PT Re-Evaluation 12/27/16   PT Start Time 1023   PT Stop Time 1116   PT Time Calculation (min) 53 min   Activity Tolerance Patient tolerated treatment well   Behavior During Therapy [facility] for tasks assessed/performed      Past Medical History:  Diagnosis Date  . Jaundice, neonatal   . Premature birth    6337 weeks    Past Surgical History:  Procedure Laterality Date  . TONSILLECTOMY  2009    There were no vitals filed for this visit.       Subjective Assessment - 11/29/16 1023    Subjective Pt reports she twisted her Rt ankle on 08/05/16 while getting  out of a [employer], she has been in in a CAM boot, ASO, off on since then.  Most recently out of boot and off crutches 2 wks ago.  Currently wearing a lace up brace wihen up. Had injection about a month ago and it helped with the swelling.    How long can you walk comfortably? tolerates abou t 3 hrs of her 8 hr shift at work.    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    Currently in Pain? Yes   Pain Score 2    Pain Location Ankle   Pain Orientation Right;Lateral   Pain Descriptors / Indicators Dull   Pain Type Acute pain   Pain Onset More than a month ago   Pain Frequency Constant   Aggravating Factors  walking, working - has to lift/move heavy things   Pain Relieving Factors being off the leg and doing nothing.             [facility] - 11/29/16 0001      Assessment   Medical Diagnosis Rt  ankle sprain   Referring Provider Dr Benjamin Stainhekkekandam   Onset Date/Surgical Date 08/05/16   Hand Dominance Right   Next MD Visit PRN   Prior Therapy had e-stim in april     Precautions   Precautions None   Required Braces or Orthoses --  using ASO type brace     Balance Screen   Has the patient fallen in the past 6 months Yes   How many times? 1  which caused injury   Has the patient had a decrease in activity level because of a fear of falling?  No   Is the patient reluctant to leave their home because of a fear of falling?  No     Home Environment   Living Environment Private residence   Home Layout Two level  has some pain      Prior Function   Level of Independence Independent   Vocation Full time employment   MicrobiologistVocation Requirements retail - sports store, full time student in fall.    Leisure cleaning, playing on laptop     Observation/Other Assessments   Focus on Therapeutic Outcomes (FOTO)  52% limited  Observation/Other Assessments-Edema    Edema --  slight visible edema Rt ankle     Functional Tests   Functional tests Squat     Squat   Comments weight shift Lt, decreased dorsiflexion Rt      Posture/Postural Control   Posture/Postural Control --  pes planus Lt,      ROM / Strength   AROM / PROM / Strength AROM;Strength     AROM   Overall AROM Comments great toe extension WNL, good metatarsal mobility.    AROM Assessment Site Ankle   Right/Left Ankle Left;Right   Right Ankle Dorsiflexion -2  with pain, passively 4   Right Ankle Plantar Flexion 49   Right Ankle Inversion 30   Right Ankle Eversion 8  passively 28 with pain   Left Ankle Dorsiflexion 0  passively 5   Left Ankle Plantar Flexion 45   Left Ankle Inversion 32   Left Ankle Eversion 37     Strength   Strength Assessment Site Knee;Ankle;Hip   Right/Left Hip --  WNL   Right/Left Knee --  bilat WNL   Right/Left Ankle Right  Lt WNL   Right Ankle Dorsiflexion 4+/5   Right Ankle Plantar  Flexion 4/5   Right Ankle Inversion 5/5   Right Ankle Eversion 4-/5     Palpation   Palpation comment pain, tightness and trigger points in Rt peroneals and medial gastroc, hypomobile Rt talus, pain with Rt calcaneal rocks.             Objective measurements completed on examination: See above findings.          [facility] Adult PT Treatment/Exercise - 11/29/16 0001      Exercises   Exercises Ankle     Modalities   Modalities Electrical Stimulation;Vasopneumatic     Programme researcher, broadcasting/film/video Location Rt ankle   Aeronautical engineer Parameters to tolerance   Electrical Stimulation Goals Edema;Pain     Vasopneumatic   Number Minutes Vasopneumatic  15 minutes   Vasopnuematic Location  Ankle  Rt   Vasopneumatic Pressure Low   Vasopneumatic Temperature  3*     Ankle Exercises: Stretches   Soleus Stretch 30 seconds  bilat   Gastroc Stretch 30 seconds  bilat   Gastroc Stretch Limitations VC for form     Ankle Exercises: Standing   SLS 10 reps Rt with toe taps Lt FWD/side/BWD     Ankle Exercises: Seated   ABC's 1 rep   Ankle Circles/Pumps Right;10 reps                PT Education - 11/29/16 1111    Education provided Yes   Education Details HEP DN   Person(s) Educated Patient   Methods Explanation;Demonstration;Handout   Comprehension Returned demonstration;Verbalized understanding             PT Long Term Goals - 11/29/16 1122      PT LONG TERM GOAL #1   Title I with advanced HEP ( 12/27/16)    Time 4   Period Weeks   Status New     PT LONG TERM GOAL #2   Title improve bilat ankle dorsiflexion =/> 10 degrees, Rt eversion WNL ( 12/27/16)    Time 4   Period Weeks   Status New     PT LONG TERM GOAL #3   Title demo Rt ankle strength =/> 5-/5 ( 12/27/16)    Time 4  Period Weeks   Status New     PT LONG TERM GOAL #4   Title tolerate wearing heels for 1-2 hours with no  more than 2/10 pain ( 12/27/16)    Time 4   Period Weeks   Status New     PT LONG TERM GOAL #5   Title improve FOTO =/< 37% limited ( 12/27/16)    Time 4   Period Weeks   Status New                Plan - 11/29/16 1117    Clinical Impression Statement 21 yo female presents ~ 4 months s/p Rt ankle sprain with bone contusions.  She has been immobilized and NWB on/off over the last 4 months.  Currently she is using an ASO.  She has limited Rt ankle ROM and strength,  decreased proprioception, edema, trigger points and pain.  She returns to school in about a month and will be required to wear heels at times.  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    Clinical Presentation Stable   Clinical Decision Making Low   Rehab Potential Good   PT Frequency 2x / week   PT Duration 4 weeks   PT Treatment/Interventions Moist Heat;Ultrasound;Therapeutic exercise;Dry needling;Vasopneumatic Device;Manual techniques;Neuromuscular re-education;Cryotherapy;Electrical Stimulation;Iontophoresis 4mg /ml Dexamethasone;Patient/family education   PT Next Visit Plan Rt talus mobs, proprioception work, possible DN/manual work to Rt lower leg.    Consulted and Agree with Plan of Care Patient      Patient will benefit from skilled therapeutic intervention in order to improve the following deficits and impairments:  Decreased range of motion, Difficulty walking, Pain, Increased muscle spasms, Increased edema, Decreased strength, Hypomobility  Visit Diagnosis: Pain in right ankle and joints of right foot - Plan: PT plan of care cert/re-cert  Stiffness of right ankle, not elsewhere classified - Plan: PT plan of care cert/re-cert  Muscle weakness (generalized) - Plan: PT plan of care cert/re-cert  Localized swelling, mass and lump, right lower limb - Plan: PT plan of care cert/re-cert     Problem List Patient Active Problem List   Diagnosis Date  Noted  . Moderate right ankle sprain 10/15/2016  . PAC (premature atrial contraction) 10/09/2015  . PVC (premature ventricular contraction) 10/09/2015  . Ear ringing 02/22/2014  . Major depression, recurrent, chronic (HCC) 08/21/2013  . Insomnia 08/21/2013  . GAD (generalized anxiety disorder) 03/30/2013    Roderic Scarce PT  11/29/2016, 11:25 AM  [facility] [address] [place], [place], [address] Phone: [phone]   Fax:  [phone]  Name: Idy Rawling MRN: [id number] Date of Birth: Jan 09, 1996

## 2016-12-01 ENCOUNTER — Encounter: Payer: Self-pay | Admitting: Physical Therapy

## 2016-12-01 ENCOUNTER — Ambulatory Visit (INDEPENDENT_AMBULATORY_CARE_PROVIDER_SITE_OTHER): Payer: BLUE CROSS/BLUE SHIELD | Admitting: Physical Therapy

## 2016-12-01 DIAGNOSIS — M6281 Muscle weakness (generalized): Secondary | ICD-10-CM | POA: Diagnosis not present

## 2016-12-01 DIAGNOSIS — M25571 Pain in right ankle and joints of right foot: Secondary | ICD-10-CM | POA: Diagnosis not present

## 2016-12-01 DIAGNOSIS — M25671 Stiffness of right ankle, not elsewhere classified: Secondary | ICD-10-CM | POA: Diagnosis not present

## 2016-12-01 DIAGNOSIS — R2241 Localized swelling, mass and lump, right lower limb: Secondary | ICD-10-CM | POA: Diagnosis not present

## 2016-12-01 NOTE — Therapy (Signed)
[facility] [place] [address] [place], [place], [address] Phone: [phone]   Fax:  [phone]  Physical Therapy Treatment  Patient Details  Name: Tonya Clark MRN: [id number] Date of Birth: [birth year] Referring Provider: Dr Benjamin Stain  Encounter Date: 12/01/2016      PT End of Session - 12/01/16 1431    Visit Number 2   Number of Visits 8   Date for PT Re-Evaluation 12/27/16   PT Start Time 1431   PT Stop Time 1527   PT Time Calculation (min) 56 min      Past Medical History:  Diagnosis Date  . Jaundice, neonatal   . Premature birth    6 weeks    Past Surgical History:  Procedure Laterality Date  . TONSILLECTOMY  2009    There were no vitals filed for this visit.      Subjective Assessment - 12/01/16 1434    Subjective Pt reports she is having less pain in the ankle and more in the sides close to the Rt knee.    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    Currently in Pain? Yes   Pain Score 3    Pain Location Leg   Pain Orientation Right;Medial;Lateral   Pain Descriptors / Indicators Sharp   Pain Type Acute pain   Pain Onset Yesterday   Pain Frequency Intermittent   Aggravating Factors  just there            [facility] PT Assessment - 12/01/16 0001      AROM   Right Ankle Dorsiflexion 5                     [facility] Adult PT Treatment/Exercise - 12/01/16 0001      Exercises   Exercises Ankle     Modalities   Modalities Electrical Stimulation;Vasopneumatic     Electrical Stimulation   Electrical Stimulation Location Rt ankle   Electrical Stimulation Action ion repelling   Electrical Stimulation Parameters to tolerance   Electrical Stimulation Goals Edema;Pain     Vasopneumatic   Number Minutes Vasopneumatic  15 minutes   Vasopnuematic Location  Ankle  Rt   Vasopneumatic Pressure Medium   Vasopneumatic Temperature  3*      Manual Therapy   Manual Therapy Soft tissue mobilization;Joint mobilization   Joint Mobilization Rt ankle post talus mobs 4x30 sec grade III bouts   Soft tissue mobilization Rt peroneals and medial lower leg, gastroc.      Ankle Exercises: Aerobic   Stationary Bike L2x5'  VC to keep heel down.     Ankle Exercises: Standing   SLS 5x30sec Rt LE on blue therapad     Ankle Exercises: Seated   Other Seated Ankle Exercises 20 reps , red band ankle DF/eversion/inversion          Trigger Point Dry Needling - 12/01/16 1514    Consent Given? Yes   Education Handout Provided Yes   Muscles Treated Lower Body --  posterior tib, good twitch response Rt side                   PT Long Term Goals - 11/29/16 1122      PT LONG TERM GOAL #1   Title I with advanced HEP ( 12/27/16)    Time 4   Period Weeks   Status New     PT LONG TERM  GOAL #2   Title improve bilat ankle dorsiflexion =/> 10 degrees, Rt eversion WNL ( 12/27/16)    Time 4   Period Weeks   Status New     PT LONG TERM GOAL #3   Title demo Rt ankle strength =/> 5-/5 ( 12/27/16)    Time 4   Period Weeks   Status New     PT LONG TERM GOAL #4   Title tolerate wearing heels for 1-2 hours with no more than 2/10 pain ( 12/27/16)    Time 4   Period Weeks   Status New     PT LONG TERM GOAL #5   Title improve FOTO =/< 37% limited ( 12/27/16)    Time 4   Period Weeks   Status New               Plan - 12/01/16 1631    Clinical Impression Statement This is Tonya Clark's second visit, she has increased ankle dorsiflexion from performing her HEP.  She continues to have weakness and impaired proprioception in the Rt ankle/LE/    Rehab Potential Good   PT Frequency 2x / week   PT Duration 4 weeks   PT Treatment/Interventions Moist Heat;Ultrasound;Therapeutic exercise;Dry needling;Vasopneumatic Device;Manual techniques;Neuromuscular re-education;Cryotherapy;Electrical Stimulation;Iontophoresis 4mg /ml  Dexamethasone;Patient/family education   PT Next Visit Plan assess response to DN and talus mobs, progress standing ther ex.    Consulted and Agree with Plan of Care Patient      Patient will benefit from skilled therapeutic intervention in order to improve the following deficits and impairments:  Decreased range of motion, Difficulty walking, Pain, Increased muscle spasms, Increased edema, Decreased strength, Hypomobility  Visit Diagnosis: Pain in right ankle and joints of right foot  Stiffness of right ankle, not elsewhere classified  Muscle weakness (generalized)  Localized swelling, mass and lump, right lower limb     Problem List Patient Active Problem List   Diagnosis Date Noted  . Moderate right ankle sprain 10/15/2016  . PAC (premature atrial contraction) 10/09/2015  . PVC (premature ventricular contraction) 10/09/2015  . Ear ringing 02/22/2014  . Major depression, recurrent, chronic (HCC) 08/21/2013  . Insomnia 08/21/2013  . GAD (generalized anxiety disorder) 03/30/2013    Roderic ScarceSusan Harwood Nall PT  12/01/2016, 4:37 PM  [facility] [address] [place], [place], [address] Phone: [phone]   Fax:  [phone]  Name: Tonya Clark MRN: [id number] Date of Birth: [birth year]

## 2016-12-07 ENCOUNTER — Ambulatory Visit (INDEPENDENT_AMBULATORY_CARE_PROVIDER_SITE_OTHER): Payer: BLUE CROSS/BLUE SHIELD | Admitting: Physical Therapy

## 2016-12-07 DIAGNOSIS — M6281 Muscle weakness (generalized): Secondary | ICD-10-CM | POA: Diagnosis not present

## 2016-12-07 DIAGNOSIS — M25671 Stiffness of right ankle, not elsewhere classified: Secondary | ICD-10-CM | POA: Diagnosis not present

## 2016-12-07 DIAGNOSIS — R2241 Localized swelling, mass and lump, right lower limb: Secondary | ICD-10-CM

## 2016-12-07 DIAGNOSIS — M25571 Pain in right ankle and joints of right foot: Secondary | ICD-10-CM | POA: Diagnosis not present

## 2016-12-07 NOTE — Therapy (Addendum)
[facility] [address] [place], [place], [address] Phone: [phone]   Fax:  [phone]  Physical Therapy Treatment  Patient Details  Name: Tonya EdwardsMichaela Clark MRN: [id number] Date of Birth: Oct 18, 1995 Referring Provider: Dr Benjamin Stainhekkekandam  Encounter Date: 12/07/2016      PT End of Session - 12/07/16 1011    Visit Number 3   Number of Visits 8   Date for PT Re-Evaluation 12/27/16   PT Start Time 1012   PT Stop Time 1105   PT Time Calculation (min) 53 min      Past Medical History:  Diagnosis Date  . Jaundice, neonatal   . Premature birth    8537 weeks    Past Surgical History:  Procedure Laterality Date  . TONSILLECTOMY  2009    There were no vitals filed for this visit.      Subjective Assessment - 12/07/16 1015    Subjective Pt reports she aggravated her Rt ankle yesterday at the gym. Pt states she feels pain, but she has been used to it for the last four months. Her Rt ankle really hurts when she lifts heavy things at work.   How long can you walk comfortably? tolerates abou t 3 hrs of her 8 hr shift at work.    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    Currently in Pain? Yes   Pain Score 3    Pain Location ankle   Pain Orientation Right   Pain Descriptors / Indicators Constant   Pain Radiating Towards knee   Pain Onset Yesterday   Aggravating Factors  Lifting and walking around all day.   Pain Relieving Factors Compression            [facility] PT Assessment - 12/07/16 0001      Assessment   Medical Diagnosis Rt ankle sprain   Referring Provider Dr Benjamin Stainhekkekandam   Onset Date/Surgical Date 08/05/16   Hand Dominance Right   Next MD Visit PRN   Prior Therapy had e-stim in april     ROM / Strength   AROM / PROM / Strength PROM;AROM     AROM   Right Ankle Dorsiflexion 3   Left Ankle  Dorsiflexion 8     PROM   PROM Assessment Site Ankle   Right/Left Ankle Right;Left   Right Ankle Dorsiflexion 18  during standing calf stretch   Left Ankle Dorsiflexion 20          [facility] Adult PT Treatment/Exercise - 12/07/16 0001      Exercises   Exercises Ankle     Modalities   Modalities Electrical Stimulation;Vasopneumatic     Electrical Stimulation   Electrical Stimulation Location Rt ankle   Electrical Stimulation Action ion repelling   Electrical Stimulation Parameters to tolerance   Electrical Stimulation Goals Edema;Pain     Vasopneumatic   Number Minutes Vasopneumatic  15 minutes   Vasopnuematic Location  Ankle   Vasopneumatic Pressure Low   Vasopneumatic Temperature  3*     Manual Therapy   Manual Therapy Soft tissue mobilization   Joint Mobilization Rt ankle post talus mobs 4x30 sec grade III bouts     Ankle Exercises: Seated   Other Seated Ankle Exercises 20 reps , red band ankle DF/eversion/inversion     Ankle Exercises: Stretches   Soleus Stretch 2 reps;20 seconds  Gastroc Stretch 2 reps;20 seconds     Ankle Exercises: Aerobic   Stationary Bike L2x5'     Ankle Exercises: Standing   SLS Rt SLS on blue pad with weighted ball to trampoline x15 straight ahead, then x15 both Rt and Lt vectors; SLS forward leans to chair height x10 each leg   Other Standing Ankle Exercises Lt step-down on 3" step with UE support x10, poor form despite multiple VC                     PT Long Term Goals - 11/29/16 1122      PT LONG TERM GOAL #1   Title I with advanced HEP ( 12/27/16)    Time 4   Period Weeks   Status New     PT LONG TERM GOAL #2   Title improve bilat ankle dorsiflexion =/> 10 degrees, Rt eversion WNL ( 12/27/16)    Time 4   Period Weeks   Status New     PT LONG TERM GOAL #3   Title demo Rt ankle strength =/> 5-/5 ( 12/27/16)    Time 4   Period Weeks   Status New     PT LONG TERM GOAL #4   Title tolerate wearing heels for 1-2  hours with no more than 2/10 pain ( 12/27/16)    Time 4   Period Weeks   Status New     PT LONG TERM GOAL #5   Title improve FOTO =/< 37% limited ( 12/27/16)    Time 4   Period Weeks   Status New               Plan - 12/07/16 1134    Clinical Impression Statement Liany tolerated exercises with increased resistance and difficulty with minimal to no increase in pain. She will benefit from continued PT intervention to increase flexibility and decrease pain in Rt ankle. Pt progressing towards established goals.    Rehab Potential Good   PT Frequency 2x / week   PT Duration 4 weeks   PT Treatment/Interventions Moist Heat;Ultrasound;Therapeutic exercise;Dry needling;Vasopneumatic Device;Manual techniques;Neuromuscular re-education;Cryotherapy;Electrical Stimulation;Iontophoresis 4mg /ml Dexamethasone;Patient/family education   PT Next Visit Plan Progress with strengthening and stretching exercises.      Patient will benefit from skilled therapeutic intervention in order to improve the following deficits and impairments:  Decreased range of motion, Difficulty walking, Pain, Increased muscle spasms, Increased edema, Decreased strength, Hypomobility  Visit Diagnosis: Pain in right ankle and joints of right foot  Stiffness of right ankle, not elsewhere classified  Muscle weakness (generalized)  Localized swelling, mass and lump, right lower limb     Problem List Patient Active Problem List   Diagnosis Date Noted  . Moderate right ankle sprain 10/15/2016  . PAC (premature atrial contraction) 10/09/2015  . PVC (premature ventricular contraction) 10/09/2015  . Ear ringing 02/22/2014  . Major depression, recurrent, chronic (HCC) 08/21/2013  . Insomnia 08/21/2013  . GAD (generalized anxiety disorder) 03/30/2013    Kipp Laurence, SPTA 12/07/2016, 11:43 AM  Mayer Camel, PTA 12/07/16 12:42 PM  [facility] [place] [address] [place], [place], [address] Phone: [phone]   Fax:  [phone]  Name: Tonya Clark MRN: [id number] Date of Birth: 02-22-1996

## 2016-12-09 ENCOUNTER — Ambulatory Visit (INDEPENDENT_AMBULATORY_CARE_PROVIDER_SITE_OTHER): Payer: BLUE CROSS/BLUE SHIELD | Admitting: Physical Therapy

## 2016-12-09 DIAGNOSIS — M25671 Stiffness of right ankle, not elsewhere classified: Secondary | ICD-10-CM | POA: Diagnosis not present

## 2016-12-09 DIAGNOSIS — R2241 Localized swelling, mass and lump, right lower limb: Secondary | ICD-10-CM

## 2016-12-09 DIAGNOSIS — M25571 Pain in right ankle and joints of right foot: Secondary | ICD-10-CM | POA: Diagnosis not present

## 2016-12-09 DIAGNOSIS — M6281 Muscle weakness (generalized): Secondary | ICD-10-CM

## 2016-12-09 NOTE — Therapy (Addendum)
[facility] [address] [place], [place], [address] Phone: [phone]   Fax:  [phone]  Physical Therapy Treatment  Patient Details  Name: Tonya Clark MRN: [id number] Date of Birth: 03/19/1996 Referring Provider: Dr Benjamin Stain  Encounter Date: 12/09/2016      PT End of Session - 12/09/16 1149    Visit Number 4   Number of Visits 8   Date for PT Re-Evaluation 12/27/16   PT Start Time 1147   PT Stop Time 1246   PT Time Calculation (min) 59 min   Activity Tolerance Patient tolerated treatment well   Behavior During Therapy [facility] for tasks assessed/performed      Past Medical History:  Diagnosis Date  . Jaundice, neonatal   . Premature birth    67 weeks    Past Surgical History:  Procedure Laterality Date  . TONSILLECTOMY  2009    There were no vitals filed for this visit.      Subjective Assessment - 12/09/16 1150    Subjective Pt reports that she rolled her Rt ankle in the parking lot on the way in to the clinic. Pt is doing exercises at home and feels that the ankle circles are challenging. Pt states that she continues to feel a constant pain in her Rt ankle that stays at around 3/10.   How long can you walk comfortably? tolerates abou t 3 hrs of her 8 hr shift at work.    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    Currently in Pain? Yes   Pain Score 3    Pain Location Ankle   Pain Orientation Right   Pain Descriptors / Indicators Aching   Pain Frequency Constant   Aggravating Factors  Lifting and walking   Pain Relieving Factors Compression            [facility] PT Assessment - 12/09/16 0001      Assessment   Medical Diagnosis Rt ankle sprain   Referring Provider Dr Benjamin Stain   Onset Date/Surgical Date 08/05/16   Hand Dominance Right   Next MD Visit PRN   Prior Therapy had  e-stim in april                     [facility] Adult PT Treatment/Exercise - 12/09/16 0001      Exercises   Exercises Ankle     Modalities   Modalities Electrical Stimulation;Vasopneumatic     Electrical Stimulation   Electrical Stimulation Location Rt ankle   Electrical Stimulation Action IFC   Electrical Stimulation Parameters to tolerance   Electrical Stimulation Goals Pain     Vasopneumatic   Number Minutes Vasopneumatic  15 minutes   Vasopnuematic Location  Ankle   Vasopneumatic Pressure Low   Vasopneumatic Temperature  34     Ankle Exercises: Seated   BAPS Sitting;Level 3  CW/CCW x10 each   Other Seated Ankle Exercises Eversion; green band around Rt foot x10, 2 sets     Ankle Exercises: Standing   BAPS Standing;Level 2  9/3, 12/6, cw/ccw x 10 each   Other Standing Ankle Exercises BOSU forward step-ups, Rt lateral step-ups x10 each; Nautilus handles for balance     Ankle Exercises: Stretches   Soleus Stretch 20 seconds;3 reps   Gastroc Stretch 20 seconds;1 rep   Other Stretch Supine Rt/Lt hamstring/calf stretch  x2, 30 sec hold     Ankle Exercises: Aerobic   Stationary Bike L2x5'                     PT Long Term Goals - 12/09/16 1258      PT LONG TERM GOAL #1   Title I with advanced HEP ( 12/27/16)    Time 4   Period Weeks   Status On-going     PT LONG TERM GOAL #2   Title improve bilat ankle dorsiflexion =/> 10 degrees, Rt eversion WNL ( 12/27/16)    Time 4   Period Weeks   Status On-going     PT LONG TERM GOAL #3   Title demo Rt ankle strength =/> 5-/5 ( 12/27/16)    Time 4   Period Weeks   Status On-going     PT LONG TERM GOAL #4   Title tolerate wearing heels for 1-2 hours with no more than 2/10 pain ( 12/27/16)    Time 4   Period Weeks   Status On-going     PT LONG TERM GOAL #5   Title improve FOTO =/< 37% limited ( 12/27/16)    Time 4   Period Weeks   Status On-going               Plan - 12/09/16 1233     Clinical Impression Statement Patient "rolled" her ankle on the way in to the clinic, but did not appear to be in any additional pain than what she normally states. Pt tolerated new standing exercises without increase in symptoms. Pt reporting 40% improvement in symptoms since initiating therapy. Pt progressing towards goals and will benefit from continued PT intervention to max functional mobility. Pt has one more week before she leaves for college.   Rehab Potential Good   PT Frequency 2x / week   PT Duration 4 weeks   PT Treatment/Interventions Moist Heat;Ultrasound;Therapeutic exercise;Dry needling;Vasopneumatic Device;Manual techniques;Neuromuscular re-education;Cryotherapy;Electrical Stimulation;Iontophoresis 4mg /ml Dexamethasone;Patient/family education   PT Next Visit Plan Progress with strengthening and stretching exercises.      Patient will benefit from skilled therapeutic intervention in order to improve the following deficits and impairments:  Decreased range of motion, Difficulty walking, Pain, Increased muscle spasms, Increased edema, Decreased strength, Hypomobility  Visit Diagnosis: Pain in right ankle and joints of right foot  Stiffness of right ankle, not elsewhere classified  Muscle weakness (generalized)  Localized swelling, mass and lump, right lower limb     Problem List Patient Active Problem List   Diagnosis Date Noted  . Moderate right ankle sprain 10/15/2016  . PAC (premature atrial contraction) 10/09/2015  . PVC (premature ventricular contraction) 10/09/2015  . Ear ringing 02/22/2014  . Major depression, recurrent, chronic (HCC) 08/21/2013  . Insomnia 08/21/2013  . GAD (generalized anxiety disorder) 03/30/2013    Tonya Clark, SPTA 12/09/2016, 12:59 PM  [facility] [address] [place], [place], [address] Phone: [phone]   Fax:  [phone]  Name: Tonya EdwardsMichaela Clark MRN: [id number] Date  of Birth: 1995-12-26

## 2016-12-13 ENCOUNTER — Encounter: Payer: Self-pay | Admitting: Family Medicine

## 2016-12-13 ENCOUNTER — Ambulatory Visit (INDEPENDENT_AMBULATORY_CARE_PROVIDER_SITE_OTHER): Payer: BLUE CROSS/BLUE SHIELD | Admitting: Family Medicine

## 2016-12-13 VITALS — BP 124/71 | HR 77 | Wt 277.0 lb

## 2016-12-13 DIAGNOSIS — F322 Major depressive disorder, single episode, severe without psychotic features: Secondary | ICD-10-CM | POA: Diagnosis not present

## 2016-12-13 DIAGNOSIS — F39 Unspecified mood [affective] disorder: Secondary | ICD-10-CM | POA: Diagnosis not present

## 2016-12-13 DIAGNOSIS — G4700 Insomnia, unspecified: Secondary | ICD-10-CM | POA: Diagnosis not present

## 2016-12-13 MED ORDER — QUETIAPINE FUMARATE 50 MG PO TABS: ORAL_TABLET | ORAL | 0 refills | Status: DC

## 2016-12-13 NOTE — Progress Notes (Signed)
   Subjective:    Patient ID: Tonya Clark, female    DOB: 13-May-1996, 21 y.o.   MRN: [id number]  HPI 21 year old female here today to follow-up for depression and anxiety-I do believe she still has an underlying mood disorder. We decided to start her on Abilify. She actually felt like it did stabilize her mood but overall lowered her mood. She has been more anxious and more tearful and feeling down. She feels extremely anxious about her work shifts which are coming up. She decided not to go past the 2 mg she thought it might actually make her worse. She still taking Lexapro.she is still not sleeping well. Now having more problems staying asleep.    She is supposed to start [facility] at [employer] in about 3 weeks.  She has already contacted [facility] there to re-establish care.  She has been engaged in therapy on and off x 5 years.    Review of Systems     Objective:   Physical Exam  Constitutional: She is oriented to person, place, and time. She appears well-developed and well-nourished.  Tearful  HENT:  Head: Normocephalic and atraumatic.  flushed  Eyes: Conjunctivae and EOM are normal.  Cardiovascular: Normal rate.   Pulmonary/Chest: Effort normal.  Neurological: She is alert and oriented to person, place, and time.  Skin: Skin is dry. No pallor.  Psychiatric: She has a normal mood and affect. Her behavior is normal.  Vitals reviewed.         Assessment & Plan:  Depression/anxiety/mood disorder-we'll start Seroquel. Warned about potential S.E. We'll try to get her referred downstairs for an acute consult before she goes back to college to better determine her diagnosis. [place] need [place] therapist [place]. She has already made phone calls to re-establish with someone there.   Insomnia - seroquel should help.

## 2016-12-14 ENCOUNTER — Ambulatory Visit (INDEPENDENT_AMBULATORY_CARE_PROVIDER_SITE_OTHER): Payer: BLUE CROSS/BLUE SHIELD | Admitting: Physical Therapy

## 2016-12-14 DIAGNOSIS — M6281 Muscle weakness (generalized): Secondary | ICD-10-CM

## 2016-12-14 DIAGNOSIS — M25671 Stiffness of right ankle, not elsewhere classified: Secondary | ICD-10-CM | POA: Diagnosis not present

## 2016-12-14 DIAGNOSIS — M25571 Pain in right ankle and joints of right foot: Secondary | ICD-10-CM

## 2016-12-14 DIAGNOSIS — R2241 Localized swelling, mass and lump, right lower limb: Secondary | ICD-10-CM | POA: Diagnosis not present

## 2016-12-14 NOTE — Therapy (Signed)
[place] [address] [place], [place], [address] Phone: [phone]   Fax:  [phone]  Physical Therapy Treatment  Patient Details  Name: Tonya Clark MRN: [id number] Date of Birth: [birth year] Referring Provider: Dr Darene Lamer  Encounter Date: 12/14/2016      PT End of Session - 12/14/16 0803    Visit Number 5   Number of Visits 8   Date for PT Re-Evaluation 12/27/16   PT Start Time 0801   PT Stop Time 0902   PT Time Calculation (min) 61 min   Activity Tolerance Patient tolerated treatment well      Past Medical History:  Diagnosis Date  . Jaundice, neonatal   . Premature birth    64 weeks    Past Surgical History:  Procedure Laterality Date  . TONSILLECTOMY  2009    There were no vitals filed for this visit.      Subjective Assessment - 12/14/16 0801    Subjective Tonya Clark reports she has had two more falls over the weekend with some slight swelling in her ankle.  One was in the rain and she slipped, then lost her footing on stairs.  She reports she is very clumsy.    Currently in Pain? Yes   Pain Score 3    Pain Location Ankle   Pain Orientation Right;Lateral   Pain Descriptors / Indicators Aching   Pain Type Acute pain   Pain Onset 1 to 4 weeks ago   Pain Frequency Constant   Aggravating Factors  falls   Pain Relieving Factors compression            [facility] PT Assessment - 12/14/16 0001      Assessment   Medical Diagnosis Rt ankle sprain   Referring Provider Dr T   Onset Date/Surgical Date 08/05/16     AROM   Right Ankle Dorsiflexion 8   Right Ankle Plantar Flexion --  WNL   Right Ankle Inversion 36   Right Ankle Eversion 28     Strength   Right Ankle Dorsiflexion 5/5   Right Ankle Plantar Flexion 4+/5   Right Ankle Inversion 5/5   Right Ankle Eversion --  5-/5                     [facility] Adult PT Treatment/Exercise - 12/14/16 0001      Exercises   Exercises Ankle      Modalities   Modalities Electrical Stimulation;Ultrasound;Vasopneumatic     Electrical Stimulation   Electrical Stimulation Location Rt ankle   Electrical Stimulation Action IFC   Electrical Stimulation Parameters to tolerance   Electrical Stimulation Goals Pain     Ultrasound   Ultrasound Location Rt mid gastroc   Ultrasound Parameters 100%, 1.20mz, 1.5 w/cm2   Ultrasound Goals Pain  tightness     Vasopneumatic   Number Minutes Vasopneumatic  15 minutes   Vasopnuematic Location  Ankle   Vasopneumatic Pressure Low   Vasopneumatic Temperature  3*     Manual Therapy   Manual Therapy Soft tissue mobilization   Soft tissue mobilization STM to Rt gastroc/soleous     Ankle Exercises: Aerobic   Stationary Bike L3x5'     Ankle Exercises: Standing   SLS Rt with FWD touch to floor 3x10  Rt SLS on black therapad at rebounder, straight and 45 degr   Heel Raises 10 reps  each off step, toes in/out/straight   Other Standing Ankle Exercises BOSU forward step-ups, Rt  lateral step-ups x10 each; Nautilus handles for balance   Other Standing Ankle Exercises heel/toe walks 5x lenght of gym.     Ankle Exercises: Stretches   Gastroc Stretch 3 reps  45 sec holds, VC for form   Other Psychiatrist, 30 sec each                PT Education - 12/14/16 [phone]    Education provided Yes   Education Details HEP progression   Person(s) Educated Patient   Methods Explanation;Demonstration;Handout   Comprehension Returned demonstration             PT Long Term Goals - 12/14/16 0817      PT LONG TERM GOAL #1   Title I with advanced HEP ( 12/27/16)    Status On-going     PT LONG TERM GOAL #2   Title improve bilat ankle dorsiflexion =/> 10 degrees, Rt eversion WNL ( 12/27/16)    Status Not Met     PT LONG TERM GOAL #3   Title demo Rt ankle strength =/> 5-/5 ( 12/27/16)    Status Partially Met     PT LONG TERM GOAL #4   Title tolerate wearing heels for 1-2 hours with no  more than 2/10 pain ( 12/27/16)    Status Not Met  tolerated 20 min.     PT LONG TERM GOAL #5   Title improve FOTO =/< 37% limited ( 12/27/16)    Status On-going               Plan - 12/14/16 0829    Clinical Impression Statement Tonya Clark is progressing to goals, has partially met them.  She continues to have multiple falls, this has complicated her pain reduction in the ankle however motion and strength are improving. She has two more weeks before returning to college.    Rehab Potential Good   PT Frequency 2x / week   PT Duration 4 weeks   PT Treatment/Interventions Moist Heat;Ultrasound;Therapeutic exercise;Dry needling;Vasopneumatic Device;Manual techniques;Neuromuscular re-education;Cryotherapy;Electrical Stimulation;Iontophoresis 32m/ml Dexamethasone;Patient/family education   PT Next Visit Plan work proprioception, Rt ankle ROM for dorsiflexion   Consulted and Agree with Plan of Care Patient      Patient will benefit from skilled therapeutic intervention in order to improve the following deficits and impairments:  Decreased range of motion, Difficulty walking, Pain, Increased muscle spasms, Increased edema, Decreased strength, Hypomobility  Visit Diagnosis: Pain in right ankle and joints of right foot  Stiffness of right ankle, not elsewhere classified  Muscle weakness (generalized)  Localized swelling, mass and lump, right lower limb     Problem List Patient Active Problem List   Diagnosis Date Noted  . Moderate right ankle sprain 10/15/2016  . PAC (premature atrial contraction) 10/09/2015  . PVC (premature ventricular contraction) 10/09/2015  . Ear ringing 02/22/2014  . Major depression, recurrent, chronic ([place] 08/21/2013  . Insomnia 08/21/2013  . GAD (generalized anxiety disorder) 03/30/2013    SJeral PinchPT  12/14/2016, 8:47 AM  [facility] [address]Phone:  [phone]  Fax:  [phone] Name: Tonya LegrosMRN: [id number] of Birth: 109-08-1995

## 2016-12-14 NOTE — Patient Instructions (Addendum)
Balance / Reach    Stand on left foot, Holding _0___ pound weight in other hand. Bend knee, lowering body, and reach across. Hold _1___ seconds. Relax. Repeat __10__ times per set. Do _3___ sets per session. Do __1__ sessions per day.  http://orth.exer.us/90   Copyright  VHI. All rights reserved.

## 2016-12-15 ENCOUNTER — Ambulatory Visit (INDEPENDENT_AMBULATORY_CARE_PROVIDER_SITE_OTHER): Payer: BLUE CROSS/BLUE SHIELD | Admitting: Psychiatry

## 2016-12-15 ENCOUNTER — Encounter (HOSPITAL_COMMUNITY): Payer: Self-pay | Admitting: Psychiatry

## 2016-12-15 VITALS — BP 124/72 | HR 87 | Resp 16 | Ht 66.0 in | Wt 277.0 lb

## 2016-12-15 DIAGNOSIS — F5102 Adjustment insomnia: Secondary | ICD-10-CM | POA: Diagnosis not present

## 2016-12-15 DIAGNOSIS — F339 Major depressive disorder, recurrent, unspecified: Secondary | ICD-10-CM

## 2016-12-15 DIAGNOSIS — F411 Generalized anxiety disorder: Secondary | ICD-10-CM | POA: Diagnosis not present

## 2016-12-15 DIAGNOSIS — R443 Hallucinations, unspecified: Secondary | ICD-10-CM | POA: Diagnosis not present

## 2016-12-15 NOTE — Patient Instructions (Signed)
[facility] [employer] and wants to schedule follow up there

## 2016-12-15 NOTE — Progress Notes (Signed)
Psychiatric Initial Adult Assessment   Patient Identification: Tonya EdwardsMichaela Clark MRN:  [id number] Date of Evaluation:  12/15/2016 Referral Source: Primary care. Dr. Linford ArnoldMetheney Chief Complaint:   Chief Complaint    Establish Care     Visit Diagnosis:    ICD-10-CM   1. Major depression, recurrent, chronic (HCC) F33.9   2. GAD (generalized anxiety disorder) F41.1   3. Adjustment insomnia F51.02     History of Present Illness:  21 years old currently single Caucasian female referred by primary care physician for management of her depression and assessment  Patient has had experience of depression she continues to endorse sad days sometimes her depression lasts for days and that she withdraw depressed sadness. She also has anxiety attacks and panic attacks which stem from infrequent hallucinations at work and stress She has had one of twice a week seeing a cat or old man that seldom happens. She also endorses she has secrets and there are people trying to get the secrets. She knows it is not real but at times it makes her feel that it is real she also feels that people are watching her at times. She has distraction techniques she does to avoid indulgence in these hallucinations. There is no command hallucinations.   Abilify was started for these hallucinatory experience but it made her depression worse it. Recently she started on Seroquel now dose of 50 mg  She does worry, excessive time she worries about her finances about her future and she also has obsessive symptoms of trying to block the door 3 times and then she gets stuck in her thoughts which she frequently goes through and it may affect her sleep and makes her more worriful.   She takes lexapro that has helped depression somewhat Her hallucinatory experience started several months ago at that time she was going through a stress as well with her roommate who was giving her a hard time she has changed her roommate . Stress level got low  regarding that but she has episodes of hallucinations and overthinking her thoughts as well.   Aggravating factor: school stress , finances.  Modifying factor: Dad    Associated Signs/Symptoms: Depression Symptoms:  insomnia, difficulty concentrating, anxiety, (Hypo) Manic Symptoms:  Distractibility, Anxiety Symptoms:  Excessive Worry, Psychotic Symptoms:  Hallucinations: Visual. Feels people know she has secrets and trying to get it from her PTSD Symptoms: Roommate stess 7 months ago.   Past Psychiatric History: depression, OCD  Previous Psychotropic Medications: Yes   Substance Abuse History in the last 12 months:  No.  Consequences of Substance Abuse: NA  Past Medical History:  Past Medical History:  Diagnosis Date  . Jaundice, neonatal   . Premature birth    5837 weeks    Past Surgical History:  Procedure Laterality Date  . TONSILLECTOMY  2009    Family Psychiatric History: Father : OCD Mom : depression, anxiety  Family History:  Family History  Problem Relation Age of Onset  . Hypertension Mother   . Allergies Mother   . Anxiety disorder Father   . Hypertension Father     Social History:   Social History   Social History  . Marital status: Single    Spouse name: N/A  . Number of children: N/A  . Years of education: N/A   Occupational History  . Student    Social History Main Topics  . Smoking status: Never Smoker  . Smokeless tobacco: Never Used  . Alcohol use No  . Drug  use: No  . Sexual activity: No   Other Topics Concern  . None   Social History Narrative   Was home schooled for HS. 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 [place]     Additional Social History: group with parents is more times to her dad. 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 No legal issues. Works part time as well  Allergies:  No Known Allergies  Metabolic Disorder Labs: No  results found for: HGBA1C, MPG No results found for: PROLACTIN No results found for: CHOL, TRIG, HDL, CHOLHDL, VLDL, LDLCALC   Current Medications: Current Outpatient Prescriptions  Medication Sig Dispense Refill  . ALPRAZolam (XANAX) 0.5 MG tablet Take 0.5-1 tablets (0.25-0.5 mg total) by mouth 2 (two) times daily as needed for anxiety. 20 tablet 1  . BELSOMRA 15 MG TABS TAKE 1 TABLET BY MOUTH EVERY DAY AT BEDTIME 90 tablet 1  . escitalopram (LEXAPRO) 20 MG tablet Take 1 tablet (20 mg total) by mouth at bedtime. 90 tablet 1  . meloxicam (MOBIC) 15 MG tablet One tab PO qAM with breakfast for 2 weeks, then daily prn pain. 30 tablet 3  . metoprolol succinate (TOPROL-XL) 25 MG 24 hr tablet Take 1 tablet (25 mg total) by mouth daily. 90 tablet 1  . QUEtiapine (SEROQUEL) 50 MG tablet 1 po QHS x 2 days, then increase to BID x 2 days, then increase to 2 tabs QHS and 1 in AM x 2 days, then 2 in AM and 2 in PM. 120 tablet 0   No current facility-administered medications for this visit.     Neurologic: Headache: No Seizure: No Paresthesias:No  Musculoskeletal: Strength & Muscle Tone: within normal limits Gait & Station: normal Patient leans: no lean  Psychiatric Specialty Exam: Review of Systems  Cardiovascular: Negative for chest pain.  Skin: Negative for rash.  Psychiatric/Behavioral: Positive for hallucinations. Negative for substance abuse. The patient is nervous/anxious.     Blood pressure 124/72, pulse 87, resp. rate 16, height 5\' 6"  (1.676 m), weight 277 lb (125.6 kg), last menstrual period 12/11/2016, SpO2 98 %.Body mass index is 44.71 kg/m.  General Appearance: Casual  Eye Contact:  Fair  Speech:  Normal Rate  Volume:  Decreased  Mood:  Anxious  Affect:  Congruent  Thought Process:  Goal Directed  Orientation:  Full (Time, Place, and Person)  Thought Content:  Delusions, Ideas of Reference:   Delusions and Obsessions  Suicidal Thoughts:  No  Homicidal Thoughts:  No   Memory:  Immediate;   Fair Recent;   Fair  Judgement:  Fair  Insight:  Shallow  Psychomotor Activity:  Normal  Concentration:  Concentration: Fair and Attention Span: Fair  Recall:  [employer]iservFair  Fund of Knowledge:Fair  Language: Fair  Akathisia:  Negative  Handed:  Right  AIMS (if indicated):    Assets:  Desire for Improvement  ADL's:  Intact  Cognition: WNL  Sleep:  Variable to fair    Treatment Plan Summary: Medication management and Plan as follows  1. Major depression recurrent possible with psychotic features or with  Delusional disorder. Increase lexapro to 30 mg. Recently started seroquel 50mg  and will be increasing to uptil 200mg  in one week. Avoid daytime use if sedation. Primary care has given prescription. Reviewed side effects   2. GAD: increase lexapro as above 3. Insomnia: reviewed sleep hygiene. Recommend sleep study. Patient somewhat reluctant  She is going to follow up will start looking for a local psychiatrist or she can come in within 3-4 weeks for an appointment here as well but she will be at her college and is looking for a provider that her questions were addressed provided supportive therapy with increased medication as above. Continue to assess underlying hallucinations . There were no prior then 7 months ago.     Thresa Ross, MD 7/25/201811:33 AM

## 2016-12-16 ENCOUNTER — Ambulatory Visit (INDEPENDENT_AMBULATORY_CARE_PROVIDER_SITE_OTHER): Payer: BLUE CROSS/BLUE SHIELD | Admitting: Physical Therapy

## 2016-12-16 DIAGNOSIS — M25671 Stiffness of right ankle, not elsewhere classified: Secondary | ICD-10-CM | POA: Diagnosis not present

## 2016-12-16 DIAGNOSIS — M25571 Pain in right ankle and joints of right foot: Secondary | ICD-10-CM | POA: Diagnosis not present

## 2016-12-16 DIAGNOSIS — M6281 Muscle weakness (generalized): Secondary | ICD-10-CM

## 2016-12-16 NOTE — Therapy (Signed)
[facility] [place] [address] [place], [place], [address] Phone: [phone]   Fax:  [phone]  Physical Therapy Treatment  Patient Details  Name: Tonya Clark MRN: [id number] Date of Birth: [birth year] Referring Provider: Dr. Benjamin Stain  Encounter Date: 12/16/2016      PT End of Session - 12/16/16 0854    Visit Number 6   Number of Visits 8   Date for PT Re-Evaluation 12/27/16   PT Start Time 0847   PT Stop Time 0930   PT Time Calculation (min) 43 min      Past Medical History:  Diagnosis Date  . Jaundice, neonatal   . Premature birth    23 weeks    Past Surgical History:  Procedure Laterality Date  . TONSILLECTOMY  2009    There were no vitals filed for this visit.      Subjective Assessment - 12/16/16 0855    Subjective Pt reports she has some increased soreness in her Lt calf, both hamstrings, and Rt ankle.  She was able to walk 30 min in heels before she had to take them off.   She returns to school 8/7.    Currently in Pain? Yes   Pain Score 5    Pain Location Calf   Pain Orientation Left   Aggravating Factors  standing, heel raises   Pain Relieving Factors heating pad.             [facility] PT Assessment - 12/16/16 0001      Assessment   Medical Diagnosis Rt ankle sprain   Referring Provider Dr. Benjamin Stain   Onset Date/Surgical Date 08/05/16   Hand Dominance Right   Next MD Visit PRN   Prior Therapy had e-stim in april     AROM   Right Ankle Dorsiflexion 11           [facility] Adult PT Treatment/Exercise - 12/16/16 0001      Self-Care   Self-Care Other Self-Care Comments   Other Self-Care Comments  Pt instructed in self massage to bilat calves and hamstrings with massage stick to decrease tightness/ soreness; pt verbalized understanding and returned demo.      Modalities   Modalities --  declined     Manual Therapy   Soft tissue mobilization STM to Rt/Lt gastroc/soleous.   biofreeze applied to post Rt/Lt calf, Lt hamstring.       Ankle Exercises: Aerobic   Stationary Bike L3x 4 min     Ankle Exercises: Standing   SLS Rt/Lt SLS on bosu x 30 sec x 2 reps on each side (Lt side more challenging today)   Other Standing Ankle Exercises BOSU forward step-ups, Rt lateral step-ups - both with 3 sec pause to balance x10 each; Nautilus handles for balance as needed.  Forward single leg heel taps from 3" step x 12 each foot; single leg forward leans to chair height (dead lift without weight) x 10 each leg.   Standing hamstring stretch x 45 sec each leg.      Ankle Exercises: Stretches   Soleus Stretch 3 reps;30 seconds   Gastroc Stretch 3 reps  45 sec holds, VC for form                     PT Long Term Goals - 12/16/16 2080      PT LONG TERM GOAL #1   Title I with advanced HEP ( 12/27/16)    Time 4   Period  Weeks   Status On-going     PT LONG TERM GOAL #2   Title improve bilat ankle dorsiflexion =/> 10 degrees, Rt eversion WNL ( 12/27/16)    Time 4   Period Weeks   Status Partially Met     PT LONG TERM GOAL #3   Title demo Rt ankle strength =/> 5-/5 ( 12/27/16)    Time 4   Period Weeks   Status Partially Met     PT LONG TERM GOAL #4   Title tolerate wearing heels for 1-2 hours with no more than 2/10 pain ( 12/27/16)    Time 4   Period Weeks   Status On-going  progressing     PT LONG TERM GOAL #5   Title improve FOTO =/< 37% limited ( 12/27/16)    Time 4   Period Weeks   Status On-going               Plan - 12/16/16 1321    Clinical Impression Statement Pt reporting less pain in Rt ankle, more pain in bilat calves from recent heel raise exercise and walking in heels to prepare for sorority function.  She tolerated all exercises today without increase in pain level.  Pt reported decrease in calf pain at end of session.  Her Rt ankle DF has improved since last assessment.  Progressing towards goals.    Rehab Potential Good   PT  Frequency 2x / week   PT Duration 4 weeks   PT Treatment/Interventions Moist Heat;Ultrasound;Therapeutic exercise;Dry needling;Vasopneumatic Device;Manual techniques;Neuromuscular re-education;Cryotherapy;Electrical Stimulation;Iontophoresis 47m/ml Dexamethasone;Patient/family education   PT Next Visit Plan work proprioception, Rt ankle ROM for dorsiflexion   Consulted and Agree with Plan of Care Patient      Patient will benefit from skilled therapeutic intervention in order to improve the following deficits and impairments:  Decreased range of motion, Difficulty walking, Pain, Increased muscle spasms, Increased edema, Decreased strength, Hypomobility  Visit Diagnosis: Pain in right ankle and joints of right foot  Stiffness of right ankle, not elsewhere classified  Muscle weakness (generalized)     Problem List Patient Active Problem List   Diagnosis Date Noted  . Moderate right ankle sprain 10/15/2016  . PAC (premature atrial contraction) 10/09/2015  . PVC (premature ventricular contraction) 10/09/2015  . Ear ringing 02/22/2014  . Major depression, recurrent, chronic ([facility] 08/21/2013  . Insomnia 08/21/2013  . GAD (generalized anxiety disorder) 03/30/2013   JKerin Perna PTA 12/16/16 1:24 PM  [place] [address]Phone: [phone]  Fax:  [phone] Name: Tonya FlanaryMRN: [id number] of Birth: [birth year]

## 2016-12-17 ENCOUNTER — Ambulatory Visit: Payer: BLUE CROSS/BLUE SHIELD | Admitting: Family Medicine

## 2016-12-20 ENCOUNTER — Ambulatory Visit (INDEPENDENT_AMBULATORY_CARE_PROVIDER_SITE_OTHER): Payer: BLUE CROSS/BLUE SHIELD | Admitting: Physical Therapy

## 2016-12-20 DIAGNOSIS — M25571 Pain in right ankle and joints of right foot: Secondary | ICD-10-CM | POA: Diagnosis not present

## 2016-12-20 DIAGNOSIS — M6281 Muscle weakness (generalized): Secondary | ICD-10-CM

## 2016-12-20 DIAGNOSIS — M25671 Stiffness of right ankle, not elsewhere classified: Secondary | ICD-10-CM

## 2016-12-20 NOTE — Therapy (Signed)
[facility] [place] [address] [place], [place], [address] Phone: [phone]   Fax:  [phone]  Physical Therapy Treatment  Patient Details  Name: Tonya EdwardsMichaela Clark MRN: [id number] Date of Birth: 09-08-1995 Referring Provider: Dr. Benjamin Stainhekkekandam  Encounter Date: 12/20/2016      PT End of Session - 12/20/16 1238    Visit Number 7   Number of Visits 8   Date for PT Re-Evaluation 12/27/16   PT Start Time 1145   PT Stop Time 1234   PT Time Calculation (min) 49 min   Activity Tolerance Patient tolerated treatment well;No increased pain   Behavior During Therapy WFL for tasks assessed/performed      Past Medical History:  Diagnosis Date  . Jaundice, neonatal   . Premature birth    8037 weeks    Past Surgical History:  Procedure Laterality Date  . TONSILLECTOMY  2009    There were no vitals filed for this visit.      Subjective Assessment - 12/20/16 1150    Subjective Pt reports her Rt ankle pain is now more intermittent, only noticable at heel strike with gait. No pain at rest.  She went to a concert this weekend, standing most of time with mild increase in pain.  Calves no longer sore.    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    Currently in Pain? Yes   Pain Score 2    Pain Location Ankle   Pain Orientation Right   Pain Descriptors / Indicators Sharp   Aggravating Factors  walking, heels    Pain Relieving Factors rest             [facility] PT Assessment - 12/20/16 0001      Assessment   Medical Diagnosis Rt ankle sprain   Referring Provider Dr. Benjamin Stainhekkekandam   Onset Date/Surgical Date 08/05/16   Hand Dominance Right   Next MD Visit PRN     AROM   Right Ankle Dorsiflexion 11   Right Ankle Eversion 35   Left Ankle Dorsiflexion 12   Left Ankle Eversion 37     Strength   Right/Left Ankle Right;Left   Right Ankle Plantar Flexion 5/5  20 heel raises     Left Ankle Dorsiflexion --   Left Ankle Plantar Flexion 4+/5  17 heel raises           [facility] Adult PT Treatment/Exercise - 12/20/16 0001      Ultrasound   Ultrasound Location Rt post tib tendon (just post to med malleoli) and lateral Rt ankle    Ultrasound Parameters 100%, 0.9 w/cm2, 3.3 mHz.    Ultrasound Goals Pain     Manual Therapy   Joint Mobilization Rt ankle post talus mobs 4x30 sec grade III bouts  pt point tender along Rt ankle joint line   Soft tissue mobilization STM to Rt post tib (gentle)     Ankle Exercises: Aerobic   Stationary Bike L2: 5 min    Elliptical trial, unable to complete equal weightbearing during full revolutions due to discomfort; stopped.      Ankle Exercises: Stretches   Soleus Stretch 3 reps;30 seconds   Gastroc Stretch 5 reps  3 reps at beginning, 2 reps end of session.      Ankle Exercises: Standing   BAPS Standing;Level 3  9/3, 12/6, cw/ccw x 10 each   SLS Rt/Lt SLS on bosu x 30 sec  x 2 reps on each side (Lt side more challenging today)   Heel Raises 20 reps  Rt/Lt   Heel Walk (Round Trip) 20 ft   Toe Walk (Round Trip) 20 ft                     PT Long Term Goals - 12/20/16 1153      PT LONG TERM GOAL #1   Title I with advanced HEP ( 12/27/16)    Time 4   Period Weeks   Status On-going     PT LONG TERM GOAL #2   Title improve bilat ankle dorsiflexion =/> 10 degrees, Rt eversion WNL ( 12/27/16)    Time 4   Period Weeks   Status Achieved     PT LONG TERM GOAL #3   Title demo Rt ankle strength =/> 5-/5 ( 12/27/16)    Time 4   Period Weeks   Status Achieved     PT LONG TERM GOAL #4   Title tolerate wearing heels for 1-2 hours with no more than 2/10 pain ( 12/27/16)    Time 4   Period Weeks   Status On-going     PT LONG TERM GOAL #5   Title improve FOTO =/< 37% limited ( 12/27/16)    Time 4   Period Weeks   Status On-going               Plan - 12/20/16 1257    Clinical Impression Statement Pt  reporting her pain in Rt ankle is no longer constant.  Her Rt ankle ROM and strength has improved since initial eval.  She tolerated all exercises with only min increase in pain.  Pt reported reduction of pain at end of session after US and 2nd round of calf stretches.  Pt has metLTG#2 and 3 and is progressing well towards remaining goals.    Rehab Potential Good   PT Frequency 2x / week   PT Duration 4 weeks   PT Treatment/Interventions Moist Heat;Ultrasound;Therapeutic exercise;Dry needling;Vasopneumatic Device;Manual techniques;Neuromuscular re-education;Cryotherapy;Electrical Stimulation;Iontophoresis 4mg /ml Dexamethasone;Patient/family education   PT Next Visit Plan assess goals; end of POC.     Consulted and Agree with Plan of Care Patient      Patient will benefit from skilled therapeutic intervention in order to improve the following deficits and impairments:  Decreased range of motion, Difficulty walking, Pain, Increased muscle spasms, Increased edema, Decreased strength, Hypomobility  Visit Diagnosis: Pain in right ankle and joints of right foot  Stiffness of right ankle, not elsewhere classified  Muscle weakness (generalized)     Problem List Patient Active Problem List   Diagnosis Date Noted  . Moderate right ankle sprain 10/15/2016  . PAC (premature atrial contraction) 10/09/2015  . PVC (premature ventricular contraction) 10/09/2015  . Ear ringing 02/22/2014  . Major depression, recurrent, chronic (HCC) 08/21/2013  . Insomnia 08/21/2013  . GAD (generalized anxiety disorder) 03/30/2013   Mayer CamelJennifer Carlson-Long, PTA 12/20/16 1:01 PM  [facility] [place] [address] [place], [place], [address] Phone: [phone]   Fax:  [phone]  Name: Tonya EdwardsMichaela Clark MRN: [id number] Date of Birth: March 15, 1996

## 2016-12-22 ENCOUNTER — Ambulatory Visit (INDEPENDENT_AMBULATORY_CARE_PROVIDER_SITE_OTHER): Payer: BLUE CROSS/BLUE SHIELD | Admitting: Physical Therapy

## 2016-12-22 DIAGNOSIS — M25571 Pain in right ankle and joints of right foot: Secondary | ICD-10-CM | POA: Diagnosis not present

## 2016-12-22 DIAGNOSIS — M6281 Muscle weakness (generalized): Secondary | ICD-10-CM | POA: Diagnosis not present

## 2016-12-22 DIAGNOSIS — M25671 Stiffness of right ankle, not elsewhere classified: Secondary | ICD-10-CM | POA: Diagnosis not present

## 2016-12-22 DIAGNOSIS — R2241 Localized swelling, mass and lump, right lower limb: Secondary | ICD-10-CM | POA: Diagnosis not present

## 2016-12-22 NOTE — Patient Instructions (Signed)

## 2016-12-22 NOTE — Therapy (Addendum)
[place], [place], [address] Phone: [phone]   Fax:  [phone]  Physical Therapy Treatment  Patient Details  Name: Tonya Clark MRN: [id number] Date of Birth: January 26, 1996 Referring Provider: Dr. Dianah Field  Encounter Date: 12/22/2016      PT End of Session - 12/22/16 0943    Visit Number 8   Number of Visits 8   Date for PT Re-Evaluation 12/27/16   PT Start Time 0932   PT Stop Time 1025   PT Time Calculation (min) 53 min   Activity Tolerance Patient tolerated treatment well;No increased pain   Behavior During Therapy WFL for tasks assessed/performed      Past Medical History:  Diagnosis Date  . Jaundice, neonatal   . Premature birth    70 weeks    Past Surgical History:  Procedure Laterality Date  . TONSILLECTOMY  2009    There were no vitals filed for this visit.      Subjective Assessment - 12/22/16 1735    Subjective Pt reports she has had more pain in Rt ankle since last visit.  It is most noticable with prolonged standing at work.  She only tried icy hot on ankle, with little relief.  She is returning to college; this will be last visit.    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    Currently in Pain? Yes   Pain Score 4    Pain Location Ankle   Pain Orientation Right   Pain Descriptors / Indicators Sharp   Aggravating Factors  prolonged standing, heels   Pain Relieving Factors rest             [facility] PT Assessment - 12/22/16 0001      Observation/Other Assessments   Focus on Therapeutic Outcomes (FOTO)  29% limited.           [facility] Adult PT Treatment/Exercise - 12/22/16 0001      Electrical Stimulation   Electrical Stimulation Location Rt ankle   Electrical Stimulation Action IFC   Electrical Stimulation Parameters to tolerance    Electrical Stimulation Goals Pain     Vasopneumatic   Number  Minutes Vasopneumatic  15 minutes   Vasopnuematic Location  Ankle   Vasopneumatic Pressure Low   Vasopneumatic Temperature  3*     Manual Therapy   Manual Therapy Taping   Kinesiotex Edema     Kinesiotix   Edema Rock tape application:  I strips criss-crossed over anterior Rt ankle, I strip rounding both medial and lateral malleoli      Ankle Exercises: Stretches   Soleus Stretch 3 reps;30 seconds   Gastroc Stretch 5 reps  3 reps at beginning, 2 reps end of session.    Other Stretch Rt hamstring stretch x 30 sec x 2 reps     Ankle Exercises: Standing   SLS Rt/Lt SLS on 3" pad with horiz head turns x 30 sec, repeated with arm abduct 2 reps on each side (Lt side more challenging today)   Heel Walk (Round Trip) 40 ft   Toe Walk (Round Trip) 40 ft     Ankle Exercises: Aerobic   Stationary Bike L2: 5 min      Ankle Exercises: Seated   Other Seated Ankle Exercises 10 reps DF with Eversion with red each leg; eversion with green band x 10 reps, 2 sets  PT Education - 12/22/16 1014    Education provided Yes   Education Details info on [employer]) Educated Patient   Methods Explanation;Handout   Comprehension Verbalized understanding             PT Long Term Goals - 12/22/16 1742      PT LONG TERM GOAL #1   Title I with advanced HEP ( 12/27/16)    Time 4   Period Weeks   Status Achieved     PT LONG TERM GOAL #2   Title improve bilat ankle dorsiflexion =/> 10 degrees, Rt eversion WNL ( 12/27/16)    Time 4   Period Weeks   Status Achieved     PT LONG TERM GOAL #3   Title demo Rt ankle strength =/> 5-/5 ( 12/27/16)    Time 4   Period Weeks   Status Achieved     PT LONG TERM GOAL #4   Title tolerate wearing heels for 1-2 hours with no more than 2/10 pain ( 12/27/16)    Time 4   Period Weeks   Status Not Met     PT LONG TERM GOAL #5   Title improve FOTO =/< 37% limited ( 12/27/16)    Time 4   Period Weeks   Status Achieved                Plan - 12/22/16 1743    Clinical Impression Statement Pt tolerated treatment well, without increase in pain.  She has partially met her goals and is agreeable to d/c since she is leaving for college.     Rehab Potential Good   PT Frequency 2x / week   PT Duration 4 weeks   PT Treatment/Interventions Moist Heat;Ultrasound;Therapeutic exercise;Dry needling;Vasopneumatic Device;Manual techniques;Neuromuscular re-education;Cryotherapy;Electrical Stimulation;Iontophoresis 63m/ml Dexamethasone;Patient/family education   PT Next Visit Plan spoke to supervising PT; will d/c at this time.    Consulted and Agree with Plan of Care Patient      Patient will benefit from skilled therapeutic intervention in order to improve the following deficits and impairments:  Decreased range of motion, Difficulty walking, Pain, Increased muscle spasms, Increased edema, Decreased strength, Hypomobility  Visit Diagnosis: Pain in right ankle and joints of right foot  Stiffness of right ankle, not elsewhere classified  Muscle weakness (generalized)  Localized swelling, mass and lump, right lower limb     Problem List Patient Active Problem List   Diagnosis Date Noted  . Moderate right ankle sprain 10/15/2016  . PAC (premature atrial contraction) 10/09/2015  . PVC (premature ventricular contraction) 10/09/2015  . Ear ringing 02/22/2014  . Major depression, recurrent, chronic ([facility] 08/21/2013  . Insomnia 08/21/2013  . GAD (generalized anxiety disorder) 03/30/2013   JKerin Perna PTA 12/22/16 5:44 PM  [place] [address]Phone: [phone]  Fax:  [phone] Name: MNakota AckertMRN: [id number] of Birth: [birth year]  PHYSICAL THERAPY DISCHARGE SUMMARY  Visits from Start of Care: 8  Current functional level related to goals / functional outcomes: See above for last visit  measuremets   Remaining deficits: Unable to tolerate high heels for a long period of time.   Education / Equipment: HEP Plan: Patient agrees to discharge.  Patient goals were partially met. Patient is being discharged due to                                                     ?????  Pt is on vacation for a week and then returning to college.      Jeral Pinch, PT 12/27/16 2:05 PM

## 2016-12-27 ENCOUNTER — Encounter: Payer: Self-pay | Admitting: Family Medicine

## 2016-12-27 ENCOUNTER — Ambulatory Visit (INDEPENDENT_AMBULATORY_CARE_PROVIDER_SITE_OTHER): Payer: BLUE CROSS/BLUE SHIELD | Admitting: Family Medicine

## 2016-12-27 VITALS — BP 126/70 | HR 80 | Wt 280.0 lb

## 2016-12-27 DIAGNOSIS — R0683 Snoring: Secondary | ICD-10-CM

## 2016-12-27 DIAGNOSIS — F339 Major depressive disorder, recurrent, unspecified: Secondary | ICD-10-CM | POA: Diagnosis not present

## 2016-12-27 DIAGNOSIS — F411 Generalized anxiety disorder: Secondary | ICD-10-CM | POA: Diagnosis not present

## 2016-12-27 DIAGNOSIS — M25571 Pain in right ankle and joints of right foot: Secondary | ICD-10-CM | POA: Diagnosis not present

## 2016-12-27 NOTE — Progress Notes (Signed)
Subjective:    Patient ID: Tonya Clark, female    DOB: 1995/08/27, 21 y.o.   MRN: [id number]  HPI   GAD/Depression follow-up - 21 year old female is here today to follow-up for anxiety. She actually goes back to college on Saturday and had asked her to come back and right before she left so that we can make sure she was doing better. She recently saw psychiatry, Dr. Gilmore Laroche and he increased her Lexapro to 30 mg. She is also now up to 150 mg on the Seroquel at bedtime only. She does feel like it's been helping. She says she's been sleeping well and has not had any more hallucinations. Though she does feel like she's having more thoughts of despair particularly in the evenings. She says is not so much suicidal thoughts as it is more hopelessness type thoughts.She is sleeping well.  Snoring-Psychiatry I commended that she consider having a sleep study done. She said she's not willing to wear anything on her face and does not want to have to go to a sleep lab to have a sleep study done.  Review of Systems   BP 126/70   Pulse 80   Wt 280 lb (127 kg)   LMP 12/11/2016   BMI 45.19 kg/m     No Known Allergies  Past Medical History:  Diagnosis Date  . Jaundice, neonatal   . Premature birth    18 weeks    Past Surgical History:  Procedure Laterality Date  . TONSILLECTOMY  2009    Social History   Social History  . Marital status: Single    Spouse name: N/A  . Number of children: N/A  . Years of education: N/A   Occupational History  . Student    Social History Main Topics  . Smoking status: Never Smoker  . Smokeless tobacco: Never Used  . Alcohol use No  . Drug use: No  . Sexual activity: No   Other Topics Concern  . Not on file   Social History Narrative   Was home schooled for HS. She is now working at a [employer] 2-3 times per week. Born in [place].     Family History  Problem Relation Age of Onset  . Hypertension Mother   . Allergies Mother   .  Anxiety disorder Father   . Hypertension Father     Outpatient Encounter Prescriptions as of 12/27/2016  Medication Sig  . ALPRAZolam (XANAX) 0.5 MG tablet Take 0.5-1 tablets (0.25-0.5 mg total) by mouth 2 (two) times daily as needed for anxiety.  . BELSOMRA 15 MG TABS TAKE 1 TABLET BY MOUTH EVERY DAY AT BEDTIME  . escitalopram (LEXAPRO) 20 MG tablet Take 1 tablet (20 mg total) by mouth at bedtime. (Patient taking differently: Take 40 mg by mouth at bedtime. )  . meloxicam (MOBIC) 15 MG tablet One tab PO qAM with breakfast for 2 weeks, then daily prn pain.  . metoprolol succinate (TOPROL-XL) 25 MG 24 hr tablet Take 1 tablet (25 mg total) by mouth daily.  . QUEtiapine (SEROQUEL) 50 MG tablet 1 po QHS x 2 days, then increase to BID x 2 days, then increase to 2 tabs QHS and 1 in AM x 2 days, then 2 in AM and 2 in PM. (Patient taking differently: Take 150 mg by mouth at bedtime. 1 po QHS x 2 days, then increase to BID x 2 days, then increase to 2 tabs QHS and 1 in AM x 2 days,  then 2 in AM and 2 in PM.)   No facility-administered encounter medications on file as of 12/27/2016.          Objective:   Physical Exam  Constitutional: She is oriented to person, place, and time. She appears well-developed and well-nourished.  HENT:  Head: Normocephalic and atraumatic.  Cardiovascular: Normal rate, regular rhythm and normal heart sounds.   Pulmonary/Chest: Effort normal and breath sounds normal.  Neurological: She is alert and oriented to person, place, and time.  Skin: Skin is warm and dry.  Psychiatric: She has a normal mood and affect. Her behavior is normal.        Assessment & Plan:  GAD - Stable, in fact improved significantly but still struggling with depression.   Depression, Major recurrent, chronic - Some increased to sparing thoughts particularly in the evenings. I'll recheck Dr. Gilmore LarocheAkhtar who she saw recently to see if he has any recommendations. I'll get back in touch with her this  week. Otherwise tolerating the increase of Lexapro and Seroquel well.  Snoring - did discuss thatsleep studies can be done at home and that tx can include other things besdies CPAP full face mask. After some discussion she was willing to consider it.   Time spent 25 minutes, greater than 50% time spent counseling about anxiety, depression and snoring.

## 2017-01-03 ENCOUNTER — Ambulatory Visit (HOSPITAL_COMMUNITY): Payer: BLUE CROSS/BLUE SHIELD | Admitting: Psychiatry

## 2017-01-13 ENCOUNTER — Other Ambulatory Visit: Payer: Self-pay | Admitting: Family Medicine

## 2017-01-13 DIAGNOSIS — F411 Generalized anxiety disorder: Secondary | ICD-10-CM

## 2017-01-13 DIAGNOSIS — F339 Major depressive disorder, recurrent, unspecified: Secondary | ICD-10-CM

## 2017-01-14 ENCOUNTER — Other Ambulatory Visit: Payer: Self-pay | Admitting: Family Medicine

## 2017-02-01 DIAGNOSIS — N1330 Unspecified hydronephrosis: Secondary | ICD-10-CM | POA: Diagnosis not present

## 2017-02-01 DIAGNOSIS — R1011 Right upper quadrant pain: Secondary | ICD-10-CM | POA: Diagnosis not present

## 2017-02-02 DIAGNOSIS — R109 Unspecified abdominal pain: Secondary | ICD-10-CM | POA: Diagnosis not present

## 2017-02-02 DIAGNOSIS — Z87442 Personal history of urinary calculi: Secondary | ICD-10-CM | POA: Diagnosis not present

## 2017-02-05 DIAGNOSIS — R1032 Left lower quadrant pain: Secondary | ICD-10-CM | POA: Diagnosis not present

## 2017-02-05 DIAGNOSIS — N139 Obstructive and reflux uropathy, unspecified: Secondary | ICD-10-CM | POA: Diagnosis not present

## 2017-02-05 DIAGNOSIS — N1330 Unspecified hydronephrosis: Secondary | ICD-10-CM | POA: Diagnosis not present

## 2017-02-05 DIAGNOSIS — R109 Unspecified abdominal pain: Secondary | ICD-10-CM | POA: Diagnosis not present

## 2017-02-05 DIAGNOSIS — N201 Calculus of ureter: Secondary | ICD-10-CM | POA: Diagnosis not present

## 2017-02-05 DIAGNOSIS — N200 Calculus of kidney: Secondary | ICD-10-CM | POA: Diagnosis not present

## 2017-02-11 DIAGNOSIS — N201 Calculus of ureter: Secondary | ICD-10-CM | POA: Diagnosis not present

## 2017-02-13 ENCOUNTER — Other Ambulatory Visit: Payer: Self-pay | Admitting: Family Medicine

## 2017-02-15 DIAGNOSIS — Z79899 Other long term (current) drug therapy: Secondary | ICD-10-CM | POA: Diagnosis not present

## 2017-02-15 DIAGNOSIS — N201 Calculus of ureter: Secondary | ICD-10-CM | POA: Diagnosis not present

## 2017-02-15 DIAGNOSIS — E669 Obesity, unspecified: Secondary | ICD-10-CM | POA: Diagnosis not present

## 2017-02-15 DIAGNOSIS — Z6841 Body Mass Index (BMI) 40.0 and over, adult: Secondary | ICD-10-CM | POA: Diagnosis not present

## 2017-02-18 ENCOUNTER — Telehealth: Payer: Self-pay | Admitting: Family Medicine

## 2017-02-21 NOTE — Telephone Encounter (Signed)
none

## 2017-02-23 DIAGNOSIS — N200 Calculus of kidney: Secondary | ICD-10-CM | POA: Diagnosis not present

## 2017-02-24 DIAGNOSIS — R1011 Right upper quadrant pain: Secondary | ICD-10-CM | POA: Diagnosis not present

## 2017-03-03 ENCOUNTER — Encounter: Payer: Self-pay | Admitting: Family Medicine

## 2017-03-03 ENCOUNTER — Ambulatory Visit (INDEPENDENT_AMBULATORY_CARE_PROVIDER_SITE_OTHER): Payer: BLUE CROSS/BLUE SHIELD | Admitting: Family Medicine

## 2017-03-03 VITALS — BP 116/72 | HR 75 | Ht 66.0 in | Wt 276.0 lb

## 2017-03-03 DIAGNOSIS — F5104 Psychophysiologic insomnia: Secondary | ICD-10-CM | POA: Diagnosis not present

## 2017-03-03 DIAGNOSIS — F411 Generalized anxiety disorder: Secondary | ICD-10-CM | POA: Diagnosis not present

## 2017-03-03 DIAGNOSIS — Z3009 Encounter for other general counseling and advice on contraception: Secondary | ICD-10-CM | POA: Diagnosis not present

## 2017-03-03 DIAGNOSIS — F339 Major depressive disorder, recurrent, unspecified: Secondary | ICD-10-CM | POA: Diagnosis not present

## 2017-03-03 DIAGNOSIS — Z23 Encounter for immunization: Secondary | ICD-10-CM

## 2017-03-03 MED ORDER — DESOGESTREL-ETHINYL ESTRADIOL 0.15-0.02/0.01 MG (21/5) PO TABS: 1.0000 | ORAL_TABLET | Freq: Every day | ORAL | 3 refills | Status: DC

## 2017-03-03 MED ORDER — SUVOREXANT 15 MG PO TABS: 1.0000 | ORAL_TABLET | Freq: Every day | ORAL | 1 refills | Status: DC

## 2017-03-03 NOTE — Progress Notes (Signed)
Subjective:    CC: Mood  HPI:  21 year old female is here today to follow-up for depression and anxiety. Since I last saw her she had another kidney stone. She ended up having have lithotripsy. She is doing much better now. She is back at college at [employer] and doing okay overall. She's working 3 days a week and going to school. She did have to drive whenever morning classes because she was having a hard time waking up. She did want to let me know that while she was sedated for her lithotripsy that she was told after she woke up that she had sleep apnea she quit breathing several times. We discussed previously that she might have sleep apnea. She feels like the Seroquel and Lexapro are working well overall. She's not had nearly as many hallucinations particularly auditory. Still occasionally some visual. She has had thoughts of being better off not here but no active suicidal thoughts. She did reach out to the counseling center at school and they have set her up with somebody externally. She just has to call and make an appointment and has the paperwork.  She also understands birth control. She is most interested in the Nexplanon. She would like something to control her periods. She says they're very irregular and often late. In the when she does believe they tend to be heavy areas she just wants something to better control them. She is not currently sexually active.  Past medical history, Surgical history, Family history not pertinant except as noted below, Social history, Allergies, and medications have been entered into the medical record, reviewed, and corrections made.   Review of Systems: No fevers, chills, night sweats, weight loss, chest pain, or shortness of breath.   Objective:    General: Well Developed, well nourished, and in no acute distress.  Neuro: Alert and oriented x3, extra-ocular muscles intact, sensation grossly intact.  HEENT: Normocephalic, atraumatic  Skin: Warm  and dry, no rashes. Cardiac: Regular rate and rhythm, no murmurs rubs or gallops, no lower extremity edema.  Respiratory: Clear to auscultation bilaterally. Not using accessory muscles, speaking in full sentences.   Impression and Recommendations:    Insomnia - Continue with Seroquel. She is happy with her dosing and regimen. Also continue with the Belsomra. She does need refills sent to the pharmacy today.  Depression/anxiety-continue with Lexapro and circumflex without current dosing. She feels content with her current regimen. I think the most important next step is to get in with a therapist/counselor.  Contraceptive counseling-we discussed options including Nexplanon, , IUD, the pill, the patch etc. She is most interested in Nexplanon. Will schedule with my partner Dr. Lyn Hollingshead when she comes back home for fall break.

## 2017-03-04 ENCOUNTER — Ambulatory Visit (HOSPITAL_BASED_OUTPATIENT_CLINIC_OR_DEPARTMENT_OTHER): Payer: BLUE CROSS/BLUE SHIELD | Attending: Family Medicine | Admitting: Internal Medicine

## 2017-03-04 DIAGNOSIS — R0683 Snoring: Secondary | ICD-10-CM | POA: Diagnosis not present

## 2017-03-12 DIAGNOSIS — R0683 Snoring: Secondary | ICD-10-CM | POA: Diagnosis not present

## 2017-03-12 NOTE — Procedures (Signed)
   Patient Name: Tonya Clark, [name] Study Date: 03/05/2017 Gender: Female D.O.B: [birth year] Age (years): 21 Referring Provider: Nani Gasseratherine Metheney Height (inches): 67 Interpreting Physician: Jetty Duhamellinton Sherial Ebrahim MD, ABSM Weight (lbs): 275 RPSGT: [place], [place] BMI: 43 MRN: [id number] Neck Size: 17.75 CLINICAL INFORMATION Sleep Study Type: HST  Indication for sleep study: Snoring  Epworth Sleepiness Score: 12  SLEEP STUDY TECHNIQUE A multi-channel overnight portable sleep study was performed. The channels recorded were: nasal airflow, thoracic respiratory movement, and oxygen saturation with a pulse oximetry. Snoring was also monitored.  MEDICATIONS Patient self administered medications include: none reported.  SLEEP ARCHITECTURE Patient was studied for 391.0 minutes. The sleep efficiency was 100.0 % and the patient was supine for 85.9%. The arousal index was 0.0 per hour.  RESPIRATORY PARAMETERS The overall AHI was 4.6 per hour, with a central apnea index of 0.0 per hour.  The oxygen nadir was 85% during sleep.  CARDIAC DATA Mean heart rate during sleep was 94.0 bpm.  IMPRESSIONS - No significant obstructive sleep apnea occurred during this study (AHI = 4.6/h). Normal AHI is - No significant central sleep apnea occurred during this study (CAI = 0.0/h). - Moderate oxygen desaturation was noted during this study (Min O2 = 85%, Mean 94%). - Patient snored .  DIAGNOSIS - Normal study  RECOMMENDATIONS - Be careful with alcohol, sedatives and other CNS depressants that may worsen sleep apnea and disrupt normal sleep architecture. - Sleep hygiene should be reviewed to assess factors that may improve sleep quality. - Weight management and regular exercise should be initiated or continued.  [Electronically signed] 03/12/2017 02:26 PM  Jetty Duhamellinton Kallee Nam MD, ABSM Diplomate, American Board of Sleep Medicine   NPI: [id number]  Waymon BudgeYOUNG,Kylan Veach D Diplomate, American Board of Sleep  Medicine  ELECTRONICALLY SIGNED ON:  03/12/2017, 3:04 PM [facility] PH: [phone]   FX: [phone] ACCREDITED BY THE AMERICAN ACADEMY OF SLEEP MEDICINE

## 2017-03-23 DIAGNOSIS — F333 Major depressive disorder, recurrent, severe with psychotic symptoms: Secondary | ICD-10-CM | POA: Diagnosis not present

## 2017-03-24 DIAGNOSIS — F333 Major depressive disorder, recurrent, severe with psychotic symptoms: Secondary | ICD-10-CM | POA: Diagnosis not present

## 2017-03-30 DIAGNOSIS — F333 Major depressive disorder, recurrent, severe with psychotic symptoms: Secondary | ICD-10-CM | POA: Diagnosis not present

## 2017-03-30 DIAGNOSIS — I499 Cardiac arrhythmia, unspecified: Secondary | ICD-10-CM | POA: Diagnosis not present

## 2017-03-30 DIAGNOSIS — F411 Generalized anxiety disorder: Secondary | ICD-10-CM | POA: Diagnosis not present

## 2017-03-30 DIAGNOSIS — R45851 Suicidal ideations: Secondary | ICD-10-CM | POA: Diagnosis not present

## 2017-03-30 DIAGNOSIS — F3164 Bipolar disorder, current episode mixed, severe, with psychotic features: Secondary | ICD-10-CM | POA: Diagnosis not present

## 2017-03-30 DIAGNOSIS — Z79899 Other long term (current) drug therapy: Secondary | ICD-10-CM | POA: Diagnosis not present

## 2017-03-30 DIAGNOSIS — F329 Major depressive disorder, single episode, unspecified: Secondary | ICD-10-CM | POA: Diagnosis not present

## 2017-03-30 DIAGNOSIS — F609 Personality disorder, unspecified: Secondary | ICD-10-CM | POA: Diagnosis not present

## 2017-03-31 DIAGNOSIS — F333 Major depressive disorder, recurrent, severe with psychotic symptoms: Secondary | ICD-10-CM | POA: Diagnosis not present

## 2017-03-31 DIAGNOSIS — F609 Personality disorder, unspecified: Secondary | ICD-10-CM | POA: Diagnosis not present

## 2017-04-01 ENCOUNTER — Telehealth: Payer: Self-pay | Admitting: Emergency Medicine

## 2017-04-01 DIAGNOSIS — F333 Major depressive disorder, recurrent, severe with psychotic symptoms: Secondary | ICD-10-CM | POA: Diagnosis not present

## 2017-04-01 DIAGNOSIS — F609 Personality disorder, unspecified: Secondary | ICD-10-CM | POA: Diagnosis not present

## 2017-04-01 NOTE — Telephone Encounter (Signed)
Mother called to report that patient is in treatment residential care and in need of her rxs; uncertain which ones; gave me name of her caseworker; I called and left VM to call and request Nurse for Dr. Linford ArnoldMetheney for help in obtaining these rxs, and to tell us how to go about it. pk

## 2017-04-01 NOTE — Telephone Encounter (Signed)
OK to fill what ever we need to with one refill.

## 2017-04-01 NOTE — Telephone Encounter (Signed)
I reviewed patient's medication list with the inpatient nurse.

## 2017-04-02 DIAGNOSIS — F333 Major depressive disorder, recurrent, severe with psychotic symptoms: Secondary | ICD-10-CM | POA: Diagnosis not present

## 2017-04-02 DIAGNOSIS — F609 Personality disorder, unspecified: Secondary | ICD-10-CM | POA: Diagnosis not present

## 2017-04-04 NOTE — Telephone Encounter (Signed)
Patient will be discharged soon.

## 2017-04-05 DIAGNOSIS — F333 Major depressive disorder, recurrent, severe with psychotic symptoms: Secondary | ICD-10-CM | POA: Diagnosis not present

## 2017-04-05 DIAGNOSIS — F609 Personality disorder, unspecified: Secondary | ICD-10-CM | POA: Diagnosis not present

## 2017-04-06 DIAGNOSIS — F609 Personality disorder, unspecified: Secondary | ICD-10-CM | POA: Diagnosis not present

## 2017-04-06 DIAGNOSIS — F333 Major depressive disorder, recurrent, severe with psychotic symptoms: Secondary | ICD-10-CM | POA: Diagnosis not present

## 2017-04-08 DIAGNOSIS — F315 Bipolar disorder, current episode depressed, severe, with psychotic features: Secondary | ICD-10-CM | POA: Diagnosis not present

## 2017-04-13 ENCOUNTER — Encounter (HOSPITAL_COMMUNITY): Payer: Self-pay | Admitting: Psychiatry

## 2017-04-13 ENCOUNTER — Other Ambulatory Visit: Payer: Self-pay | Admitting: Family Medicine

## 2017-04-13 ENCOUNTER — Ambulatory Visit (INDEPENDENT_AMBULATORY_CARE_PROVIDER_SITE_OTHER): Payer: BLUE CROSS/BLUE SHIELD | Admitting: Psychiatry

## 2017-04-13 ENCOUNTER — Other Ambulatory Visit: Payer: Self-pay

## 2017-04-13 VITALS — BP 110/84 | HR 62 | Ht 66.0 in | Wt 272.0 lb

## 2017-04-13 DIAGNOSIS — I493 Ventricular premature depolarization: Secondary | ICD-10-CM

## 2017-04-13 DIAGNOSIS — F3160 Bipolar disorder, current episode mixed, unspecified: Secondary | ICD-10-CM

## 2017-04-13 DIAGNOSIS — F411 Generalized anxiety disorder: Secondary | ICD-10-CM

## 2017-04-13 DIAGNOSIS — I491 Atrial premature depolarization: Secondary | ICD-10-CM

## 2017-04-13 DIAGNOSIS — F5102 Adjustment insomnia: Secondary | ICD-10-CM

## 2017-04-13 MED ORDER — QUETIAPINE FUMARATE 200 MG PO TABS: 200.0000 mg | ORAL_TABLET | Freq: Every day | ORAL | 0 refills | Status: DC

## 2017-04-13 MED ORDER — LAMOTRIGINE 25 MG PO TABS: 25.0000 mg | ORAL_TABLET | Freq: Two times a day (BID) | ORAL | 0 refills | Status: DC

## 2017-04-13 NOTE — Progress Notes (Signed)
BH MD/PA/NP OP Progress Note  04/13/2017 1:29 PM Waynard EdwardsMichaela Hendel  MRN:  [id number]  Chief Complaint:  Chief Complaint    Follow-up; Depression     HPI: 21 years old white female. Student of [employer] Recently [facility] and [facility] thoughts  Last seen at this clinic 5 months ago and did not follow up  meds were adjusted at [facility] Says she was sleeping poor, depresed, school stress, kidney concers and all piled up  Was hallucinating and unstable  Endorses feeling better, mood euthymic. Not hallucinating now.  No rash on lamictal  Aggravating factor: school stress. Has dropped some classes now Modifying factor: family Sleep remains poor without sleep meds and seroquel  not tremors   Visit Diagnosis:    ICD-10-CM   1. Bipolar I disorder, most recent episode mixed (HCC) F31.60   2. GAD (generalized anxiety disorder) F41.1   3. Adjustment insomnia F51.02     Past Psychiatric History: mood disorder, anxiety  Past Medical History:  Past Medical History:  Diagnosis Date  . Jaundice, neonatal   . Premature birth    6937 weeks    Past Surgical History:  Procedure Laterality Date  . TONSILLECTOMY  2009    Family Psychiatric History: see  below  Family History:  Family History  Problem Relation Age of Onset  . Hypertension Mother   . Allergies Mother   . Anxiety disorder Father   . Hypertension Father     Social History:  Social History   Socioeconomic History  . Marital status: Single    Spouse name: None  . Number of children: None  . Years of education: None  . Highest education level: None  Social Needs  . Financial resource strain: None  . Food insecurity - worry: None  . Food insecurity - inability: None  . Transportation needs - medical: None  . Transportation needs - non-medical: None  Occupational History  . Occupation: Consulting civil engineertudent  Tobacco Use  . Smoking status: Never Smoker  . Smokeless tobacco: Never Used   Substance and Sexual Activity  . Alcohol use: No  . Drug use: No  . Sexual activity: No  Other Topics Concern  . None  Social History Narrative   Was home schooled for HS. She is now working at a [employer] 2-3 times per week. Born in [place].     Allergies: No Known Allergies  Metabolic Disorder Labs: No results found for: HGBA1C, MPG No results found for: PROLACTIN No results found for: CHOL, TRIG, HDL, CHOLHDL, VLDL, LDLCALC Lab Results  Component Value Date   TSH 1.34 08/25/2015   TSH 1.321 03/30/2013    Therapeutic Level Labs: No results found for: LITHIUM No results found for: VALPROATE No components found for:  CBMZ  Current Medications: Current Outpatient Medications  Medication Sig Dispense Refill  . hydrOXYzine (VISTARIL) 25 MG capsule TAKE 1 CAPSULE BY MOUTH EVERY SIX HOURS AS NEEDED  0  . lamoTRIgine (LAMICTAL) 25 MG tablet Take 1 tablet (25 mg total) by mouth 2 (two) times daily. 60 tablet 0  . metoprolol succinate (TOPROL-XL) 25 MG 24 hr tablet Take 25 mg by mouth daily.  1  . QUEtiapine (SEROQUEL) 200 MG tablet Take 1 tablet (200 mg total) by mouth at bedtime. 30 tablet 0  . Suvorexant (BELSOMRA) 15 MG TABS Take 1 tablet by mouth at bedtime. 90 tablet 1  . desogestrel-ethinyl estradiol (KARIVA) 0.15-0.02/0.01 MG (21/5) tablet Take 1 tablet by  mouth daily. (Patient not taking: Reported on 04/13/2017) 1 Package 3   No current facility-administered medications for this visit.      Musculoskeletal: Strength & Muscle Tone: within normal limits Gait & Station: normal Patient leans: no lean  Psychiatric Specialty Exam: ROS  Blood pressure 110/84, pulse 62, height 5\' 6"  (1.676 m), weight 272 lb (123.4 kg).Body mass index is 43.9 kg/m.  General Appearance: Casual  Eye Contact:  Fair  Speech:  Slow  Volume:  Normal  Mood:  Euthymic  Affect:  Congruent  Thought Process:  Goal Directed  Orientation:  Full (Time, Place, and Person)  Thought  Content: Rumination   Suicidal Thoughts:  No  Homicidal Thoughts:  No  Memory:  Immediate;   Fair Recent;   Fair  Judgement:  Fair  Insight:  Shallow  Psychomotor Activity:  Normal  Concentration:  Concentration: Fair and Attention Span: Fair  Recall:  FiservFair  Fund of Knowledge: Fair  Language: Fair  Akathisia:  Negative  Handed:  Right  AIMS (if indicated): zero  Assets:  Desire for Improvement  ADL's:  Intact  Cognition: WNL  Sleep:  Fair   Screenings: GAD-7     Office Visit from 03/03/2017 in [facility] [place] Office Visit from 12/27/2016 in [facility] [place] Office Visit from 12/13/2016 in [facility] [place] Office Visit from 11/23/2016 in [facility] [place] Office Visit from 07/23/2016 in [facility] [place]  Total GAD-7 Score  6  4  9  8  11     PHQ2-9     Office Visit from 03/03/2017 in [facility] [place] Office Visit from 12/27/2016 in [facility] [place] Office Visit from 12/13/2016 in [facility] [place] Office Visit from 11/23/2016 in [facility] [place] Office Visit from 07/23/2016 in [facility] [place]  PHQ-2 Total Score  4  4  4  3  1   PHQ-9 Total Score  17  9  12  11  10        Assessment and Plan: Bipolar or mood disorder, mixed type.  Continue seroquel. lamictal is 50mg  will continue. If need can increase for now doing reasonable  GAD: on vistaril prn can continue Sleep: disturbed without meds. Can continue seroquel. Also on belsorma if needed   Continue therapy with Tasia Catchingsraig at school and coping skills Compliance and follow up is important as she did not show for follow up after last visit  Questions addressed FU 4- 6weeks or earlier if needed    Thresa RossNadeem Shayana Hornstein, MD 04/13/2017, 1:29 PM

## 2017-04-29 DIAGNOSIS — F315 Bipolar disorder, current episode depressed, severe, with psychotic features: Secondary | ICD-10-CM | POA: Diagnosis not present

## 2017-05-04 ENCOUNTER — Other Ambulatory Visit: Payer: Self-pay | Admitting: *Deleted

## 2017-05-04 MED ORDER — DESOGESTREL-ETHINYL ESTRADIOL 0.15-0.02/0.01 MG (21/5) PO TABS: 1.0000 | ORAL_TABLET | Freq: Every day | ORAL | 4 refills | Status: DC

## 2017-05-11 ENCOUNTER — Ambulatory Visit (INDEPENDENT_AMBULATORY_CARE_PROVIDER_SITE_OTHER): Payer: BLUE CROSS/BLUE SHIELD | Admitting: Psychiatry

## 2017-05-11 ENCOUNTER — Encounter (HOSPITAL_COMMUNITY): Payer: Self-pay | Admitting: Psychiatry

## 2017-05-11 ENCOUNTER — Other Ambulatory Visit (HOSPITAL_COMMUNITY): Payer: Self-pay

## 2017-05-11 ENCOUNTER — Other Ambulatory Visit: Payer: Self-pay

## 2017-05-11 VITALS — BP 130/78 | HR 92 | Ht 66.0 in | Wt 273.0 lb

## 2017-05-11 DIAGNOSIS — F411 Generalized anxiety disorder: Secondary | ICD-10-CM | POA: Diagnosis not present

## 2017-05-11 DIAGNOSIS — F3160 Bipolar disorder, current episode mixed, unspecified: Secondary | ICD-10-CM | POA: Diagnosis not present

## 2017-05-11 DIAGNOSIS — F339 Major depressive disorder, recurrent, unspecified: Secondary | ICD-10-CM

## 2017-05-11 DIAGNOSIS — Z818 Family history of other mental and behavioral disorders: Secondary | ICD-10-CM | POA: Diagnosis not present

## 2017-05-11 DIAGNOSIS — Z79899 Other long term (current) drug therapy: Secondary | ICD-10-CM | POA: Diagnosis not present

## 2017-05-11 DIAGNOSIS — F5102 Adjustment insomnia: Secondary | ICD-10-CM | POA: Diagnosis not present

## 2017-05-11 MED ORDER — QUETIAPINE FUMARATE 200 MG PO TABS: 200.0000 mg | ORAL_TABLET | Freq: Every day | ORAL | 1 refills | Status: DC

## 2017-05-11 MED ORDER — LAMOTRIGINE 25 MG PO TABS: 75.0000 mg | ORAL_TABLET | Freq: Two times a day (BID) | ORAL | 0 refills | Status: DC

## 2017-05-11 MED ORDER — QUETIAPINE FUMARATE 100 MG PO TABS: 150.0000 mg | ORAL_TABLET | Freq: Every day | ORAL | 1 refills | Status: DC

## 2017-05-11 MED ORDER — LAMOTRIGINE 25 MG PO TABS: 75.0000 mg | ORAL_TABLET | Freq: Two times a day (BID) | ORAL | 1 refills | Status: DC

## 2017-05-11 NOTE — Progress Notes (Signed)
BH MD/PA/NP OP Progress Note  05/11/2017 1:31 PM Tonya EdwardsMichaela Clark  MRN:  [id number]  Chief Complaint:  Chief Complaint    Follow-up; Other     HPI: 21 years old white female. Student of [employer] Recently [facility] and [facility] thoughts  Somewhat down since she is at home and doesn't like, it. Working part time. Waiting to go back to school. Stressed at home.  Not hallucinating No rash  Aggravating factor: school stress. Has dropped some classes Modifying factor: family. Or if keep busy Says sleep study done and that they cant do anythign about it.  Feels somewhat foggy during day. Takes vistaril for anxiety  Visit Diagnosis:    ICD-10-CM   1. Bipolar I disorder, most recent episode mixed (HCC) F31.60   2. GAD (generalized anxiety disorder) F41.1   3. Adjustment insomnia F51.02   4. Major depression, recurrent, chronic (HCC) F33.9     Past Psychiatric History: mood disorder, anxiety  Past Medical History:  Past Medical History:  Diagnosis Date  . Jaundice, neonatal   . Premature birth    4337 weeks    Past Surgical History:  Procedure Laterality Date  . TONSILLECTOMY  2009    Family Psychiatric History: see  below  Family History:  Family History  Problem Relation Age of Onset  . Hypertension Mother   . Allergies Mother   . Anxiety disorder Father   . Hypertension Father     Social History:  Social History   Socioeconomic History  . Marital status: Single    Spouse name: None  . Number of children: None  . Years of education: None  . Highest education level: None  Social Needs  . Financial resource strain: None  . Food insecurity - worry: None  . Food insecurity - inability: None  . Transportation needs - medical: None  . Transportation needs - non-medical: None  Occupational History  . Occupation: Consulting civil engineertudent  Tobacco Use  . Smoking status: Never Smoker  . Smokeless tobacco: Never Used  Substance and Sexual Activity  .  Alcohol use: No  . Drug use: No  . Sexual activity: No  Other Topics Concern  . None  Social History Narrative   Was home schooled for HS. She is now working at a [employer] day[employer] 2-3 times per week. Born [place].     Allergies: No Known Allergies  Metabolic Disorder Labs: No results found for: HGBA1C, MPG No results found for: PROLACTIN No results found for: CHOL, TRIG, HDL, CHOLHDL, VLDL, LDLCALC Lab Results  Component Value Date   TSH 1.34 08/25/2015   TSH 1.321 03/30/2013    Therapeutic Level Labs: No results found for: LITHIUM No results found for: VALPROATE No components found for:  CBMZ  Current Medications: Current Outpatient Medications  Medication Sig Dispense Refill  . hydrOXYzine (VISTARIL) 25 MG capsule TAKE 1 CAPSULE BY MOUTH EVERY SIX HOURS AS NEEDED  0  . lamoTRIgine (LAMICTAL) 25 MG tablet Take 3 tablets (75 mg total) by mouth 2 (two) times daily. 90 tablet 1  . metoprolol succinate (TOPROL-XL) 25 MG 24 hr tablet TAKE 1 TABLET (25 MG TOTAL) BY MOUTH DAILY. 90 tablet 1  . QUEtiapine (SEROQUEL) 100 MG tablet Take 1.5 tablets (150 mg total) by mouth at bedtime. Delete the 200mg .  Dose is lowered to 150mg  45 tablet 1  . Suvorexant (BELSOMRA) 15 MG TABS Take 1 tablet by mouth at bedtime. 90 tablet 1  . desogestrel-ethinyl  estradiol (KARIVA) 0.15-0.02/0.01 MG (21/5) tablet Take 1 tablet by mouth daily. (Patient not taking: Reported on 05/11/2017) 3 Package 4   No current facility-administered medications for this visit.      Musculoskeletal: Strength & Muscle Tone: within normal limits Gait & Station: normal Patient leans: no lean  Psychiatric Specialty Exam: Review of Systems  Cardiovascular: Negative for chest pain.  Skin: Negative for rash.  Psychiatric/Behavioral: Negative for substance abuse and suicidal ideas.    Blood pressure 130/78, pulse 92, height 5\' 6"  (1.676 m), weight 273 lb (123.8 kg).Body mass index is 44.06 kg/m.  General  Appearance: Casual  Eye Contact:  Fair  Speech:  Slow  Volume:  Normal  Mood:  Fair, somewhat subdued  Affect:  congruent  Thought Process:  Goal Directed  Orientation:  Full (Time, Place, and Person)  Thought Content: Rumination   Suicidal Thoughts:  No  Homicidal Thoughts:  No  Memory:  Immediate;   Fair Recent;   Fair  Judgement:  Fair  Insight:  Shallow  Psychomotor Activity:  Normal  Concentration:  Concentration: Fair and Attention Span: Fair  Recall:  [employer]Fair  Fund of Knowledge: Fair  Language: Fair  Akathisia:  Negative  Handed:  Right  AIMS (if indicated): zero  Assets:  Desire for Improvement  ADL's:  Intact  Cognition: WNL  Sleep:  Fair   Screenings: GAD-7     Office Visit from 03/03/2017 in [facility] [place] Office Visit from 12/27/2016 in [facility] [place] Office Visit from 12/13/2016 in [facility] [place] Office Visit from 11/23/2016 in [facility] [place] Office Visit from 07/23/2016 in [facility] [place]  Total GAD-7 Score  6  4  9  8  11     PHQ2-9     Office Visit from 03/03/2017 in [facility] [place] Office Visit from 12/27/2016 in [facility] [place] Office Visit from 12/13/2016 in [facility] [place] Office Visit from 11/23/2016 in [facility] [place] Office Visit from 07/23/2016 in [facility]   PHQ-2 Total Score  4  4  4  3  1   PHQ-9 Total Score  17  9  12  11  10        Assessment and Plan: Bipolar or mood disorder, somewhat subdued. Increase lamictal to 75mg . Lower seroquel to 150mg  as she feels some fogginess. Need to see her sleep study results GAD: gets stressed, on vistaril it helps at times. Not liking to be home for now  Sleep: not well without meds. Takes belsorma and  seroquel. Can continue but lower the seroquel as above  Continue therapy with Tasia Catchingsraig at school and coping skills [place] 4 weeks or earlier if needed. Not suicidal and is compliant   [name], MD 05/11/2017, 1:31 PM

## 2017-05-29 ENCOUNTER — Encounter: Payer: Self-pay | Admitting: Family Medicine

## 2017-05-30 ENCOUNTER — Emergency Department (HOSPITAL_BASED_OUTPATIENT_CLINIC_OR_DEPARTMENT_OTHER): Payer: BLUE CROSS/BLUE SHIELD

## 2017-05-30 ENCOUNTER — Other Ambulatory Visit: Payer: Self-pay

## 2017-05-30 ENCOUNTER — Ambulatory Visit: Payer: BLUE CROSS/BLUE SHIELD | Admitting: Family Medicine

## 2017-05-30 ENCOUNTER — Emergency Department (HOSPITAL_BASED_OUTPATIENT_CLINIC_OR_DEPARTMENT_OTHER)
Admission: EM | Admit: 2017-05-30 | Discharge: 2017-05-30 | Disposition: A | Discharge status: Home / Self Care | Payer: BLUE CROSS/BLUE SHIELD | Attending: Emergency Medicine | Admitting: Emergency Medicine

## 2017-05-30 ENCOUNTER — Encounter (HOSPITAL_BASED_OUTPATIENT_CLINIC_OR_DEPARTMENT_OTHER): Payer: Self-pay

## 2017-05-30 DIAGNOSIS — R079 Chest pain, unspecified: Secondary | ICD-10-CM | POA: Insufficient documentation

## 2017-05-30 DIAGNOSIS — Z79899 Other long term (current) drug therapy: Secondary | ICD-10-CM | POA: Insufficient documentation

## 2017-05-30 DIAGNOSIS — J189 Pneumonia, unspecified organism: Secondary | ICD-10-CM | POA: Diagnosis not present

## 2017-05-30 HISTORY — DX: Calculus of kidney: N20.0

## 2017-05-30 HISTORY — DX: Cardiac arrhythmia, unspecified: I49.9

## 2017-05-30 LAB — HEPATIC FUNCTION PANEL
ALBUMIN: 4.3 g/dL (ref 3.5–5.0)
ALT: 16 U/L (ref 14–54)
AST: 20 U/L (ref 15–41)
Alkaline Phosphatase: 68 U/L (ref 38–126)
BILIRUBIN TOTAL: 0.7 mg/dL (ref 0.3–1.2)
Bilirubin, Direct: 0.2 mg/dL (ref 0.1–0.5)
Indirect Bilirubin: 0.5 mg/dL (ref 0.3–0.9)
Total Protein: 7.9 g/dL (ref 6.5–8.1)

## 2017-05-30 LAB — BASIC METABOLIC PANEL
ANION GAP: 12 (ref 5–15)
BUN: 9 mg/dL (ref 6–20)
CALCIUM: 9.5 mg/dL (ref 8.9–10.3)
CHLORIDE: 103 mmol/L (ref 101–111)
CO2: 22 mmol/L (ref 22–32)
Creatinine, Ser: 0.82 mg/dL (ref 0.44–1.00)
GFR calc non Af Amer: >60 mL/min (ref >60)
Glucose, Bld: 87 mg/dL (ref 65–99)
Potassium: 4 mmol/L (ref 3.5–5.1)
Sodium: 137 mmol/L (ref 135–145)

## 2017-05-30 LAB — CBC
HCT: 39.5 % (ref 36.0–46.0)
HEMOGLOBIN: 12.4 g/dL (ref 12.0–15.0)
MCH: 23.4 pg — AB (ref 26.0–34.0)
MCHC: 31.4 g/dL (ref 30.0–36.0)
MCV: 74.4 fL — ABNORMAL LOW (ref 78.0–100.0)
Platelets: 322 10*3/uL (ref 150–400)
RBC: 5.31 MIL/uL — AB (ref 3.87–5.11)
RDW: 16.2 % — ABNORMAL HIGH (ref 11.5–15.5)
WBC: 14.1 10*3/uL — ABNORMAL HIGH (ref 4.0–10.5)

## 2017-05-30 LAB — PREGNANCY, URINE: Preg Test, Ur: NEGATIVE

## 2017-05-30 LAB — TROPONIN I

## 2017-05-30 MED ORDER — KETOROLAC TROMETHAMINE 60 MG/2ML IM SOLN: 60.0000 mg | Freq: Once | INTRAMUSCULAR | Status: DC

## 2017-05-30 MED ORDER — DOXYCYCLINE HYCLATE 100 MG PO CAPS: 100.0000 mg | ORAL_CAPSULE | Freq: Two times a day (BID) | ORAL | 0 refills | Status: DC

## 2017-05-30 MED ORDER — HYDROCODONE-ACETAMINOPHEN 5-325 MG PO TABS: 2.0000 | ORAL_TABLET | Freq: Once | ORAL | Status: DC

## 2017-05-30 NOTE — ED Provider Notes (Signed)
[facility] EMERGENCY DEPARTMENT Provider Note   CSN: [id number] Arrival date & time: 05/30/17  1603     History   Chief Complaint Chief Complaint  Patient presents with  . Chest Pain    HPI Tonya Clark is a 22 y.o. female with PMH/o Arrhythmia who presents for evaluation of pain that began approximate 1 PM this afternoon.  Patient reports that chest pain as a dull ache on the left side.  She reports it is worse with deep inspiration.  No associated nausea, diaphoresis, shortness of breath.  She has not taken any analgesics for the pain.  Patient states that the pain is not worse with exertion.  She denies any other alleviating or aggravating factors.  She denies any positional changes that affect the pain.  Patient reports that she does have a history of a sinus arrhythmia does not recall what exactly it is.  She has not been evaluated by cardiologist for several years.  Patient denies any fever, cough, sore throat, nasal congestion, abdominal pain, nausea/vomiting, dysuria, hematuria. She denies any OCP use, recent immobilization, prior history of DVT/PE, recent surgery, leg swelling, or long travel.  Patient is not a current smoker.  Patient denies any cocaine, heroin, marijuana use.  The history is provided by the patient.    Past Medical History:  Diagnosis Date  . Arrhythmia   . Jaundice, neonatal   . Kidney stone   . Premature birth    3737 weeks    Patient Active Problem List   Diagnosis Date Noted  . Moderate right ankle sprain 10/15/2016  . PAC (premature atrial contraction) 10/09/2015  . PVC (premature ventricular contraction) 10/09/2015  . Ear ringing 02/22/2014  . Major depression, recurrent, chronic (HCC) 08/21/2013  . Insomnia 08/21/2013  . GAD (generalized anxiety disorder) 03/30/2013    Past Surgical History:  Procedure Laterality Date  . TONSILLECTOMY  2009    OB History    No data available       Home Medications    Prior to  Admission medications   Medication Sig Start Date End Date Taking? Authorizing Provider  doxycycline (VIBRAMYCIN) 100 MG capsule Take 1 capsule (100 mg total) by mouth 2 (two) times daily. 05/30/17   Maxwell CaulLayden, Rozetta Stumpp A, PA-C  hydrOXYzine (VISTARIL) 25 MG capsule TAKE 1 CAPSULE BY MOUTH EVERY SIX HOURS AS NEEDED 04/06/17   [provider]  lamoTRIgine (LAMICTAL) 25 MG tablet Take 3 tablets (75 mg total) by mouth 2 (two) times daily. 05/11/17   Thresa RossAkhtar, Nadeem, MD  metoprolol succinate (TOPROL-XL) 25 MG 24 hr tablet TAKE 1 TABLET (25 MG TOTAL) BY MOUTH DAILY. 04/18/17   Agapito GamesMetheney, [name], MD  QUEtiapine (SEROQUEL) 100 MG tablet Take 1.5 tablets (150 mg total) by mouth at bedtime. Delete the 200mg .  Dose is lowered to 150mg  05/11/17   Thresa RossAkhtar, Nadeem, MD  Suvorexant (BELSOMRA) 15 MG TABS Take 1 tablet by mouth at bedtime. 03/03/17   Agapito GamesMetheney, [name], MD    Family History Family History  Problem Relation Age of Onset  . Hypertension Mother   . Allergies Mother   . Anxiety disorder Father   . Hypertension Father     Social History Social History   Tobacco Use  . Smoking status: Never Smoker  . Smokeless tobacco: Never Used  Substance Use Topics  . Alcohol use: No  . Drug use: No     Allergies   Patient has no known allergies.   Review of Systems  Review of Systems  Constitutional: Negative for fever.  Respiratory: Negative for cough and shortness of breath.   Cardiovascular: Positive for chest pain. Negative for leg swelling.  Gastrointestinal: Negative for abdominal pain, nausea and vomiting.  Genitourinary: Negative for dysuria and hematuria.  Neurological: Negative for weakness, numbness and headaches.     Physical Exam Updated Vital Signs BP 112/61   Pulse 78   Temp 98.3 F (36.8 C) (Oral)   Resp 14   Ht 5\' 6"  (1.676 m)   Wt 121.6 kg (268 lb)   LMP 05/12/2017   SpO2 100%   BMI 43.26 kg/m   Physical Exam  Constitutional: She is oriented to  person, place, and time. She appears well-developed and well-nourished.  Sitting comfortably on examination table  HENT:  Head: Normocephalic and atraumatic.  Mouth/Throat: Oropharynx is clear and moist and mucous membranes are normal.  Eyes: Conjunctivae, EOM and lids are normal. Pupils are equal, round, and reactive to light.  Neck: Full passive range of motion without pain.  Cardiovascular: Normal rate, regular rhythm, normal heart sounds and normal pulses. Exam reveals no gallop and no friction rub.  No murmur heard. Pulses:      Radial pulses are 2+ on the right side, and 2+ on the left side.  Pulmonary/Chest: Effort normal and breath sounds normal.  No evidence of respiratory distress. Able to speak in full sentences without difficulty.  No tenderness palpation to the anterior chest wall.  Abdominal: Soft. Normal appearance. There is no tenderness. There is no rigidity and no guarding.  Musculoskeletal: Normal range of motion.  Bilateral lower extremities are symmetric in appearance  Neurological: She is alert and oriented to person, place, and time.  Skin: Skin is warm and dry. Capillary refill takes less than 2 seconds.  Psychiatric: She has a normal mood and affect. Her speech is normal.  Nursing note and vitals reviewed.    ED Treatments / Results  Labs (all labs ordered are listed, but only abnormal results are displayed) Labs Reviewed  CBC - Abnormal; Notable for the following components:      Result Value   WBC 14.1 (*)    RBC 5.31 (*)    MCV 74.4 (*)    MCH 23.4 (*)    RDW 16.2 (*)    All other components within normal limits  BASIC METABOLIC PANEL  TROPONIN I  PREGNANCY, URINE  HEPATIC FUNCTION PANEL    EKG  EKG Interpretation  Date/Time:  Monday May 30 2017 16:08:03 EST Ventricular Rate:  78 PR Interval:  128 QRS Duration: 90 QT Interval:  360 QTC Calculation: 410 R Axis:   58 Text Interpretation:  Normal sinus rhythm with sinus arrhythmia Normal  ECG Confirmed by Frederick Peers [phone]), editor Barbette Hair [phone]) on 05/30/2017 4:21:21 PM       Radiology Dg Chest 2 View  Result Date: 05/30/2017 CLINICAL DATA:  Onset of left-sided pleuritic chest pain approximately 4 hours ago. EXAM: CHEST  2 VIEW COMPARISON:  PA and lateral chest x-ray of Oct 12, 2014 FINDINGS: The lungs are adequately inflated. There is subtle increased density just above the midportion of the left hemidiaphragm which obscures the cardiac apex. The heart and pulmonary vascularity are normal. There is no pleural effusion. The mediastinum is normal in width. The bony thorax exhibits no acute abnormality. IMPRESSION: Subsegmental atelectasis or early pneumonia laterally in the lingula. Follow-up radiographs following anticipated antibiotic therapy are recommended unless the patient's symptoms completely resolve. Electronically Signed  By: David  [place] M.D.   On: 05/30/2017 16:52    Procedures Procedures (including critical care time)  Medications Ordered in ED Medications - No data to display   Initial Impression / Assessment and Plan / ED Course  I have reviewed the triage vital signs and the nursing notes.  Pertinent labs & imaging results that were available during my care of the patient were reviewed by me and considered in my medical decision making (see chart for details).     22 y.o. female who presents for evaluation of chest pain that began approximately 1 PM this afternoon.  No associated shortness of breath, nausea, vomiting, diaphoresis.  Pain is worse with deep inspiration but not with exertion.  Patient has a history of arrhythmia but denies any other cardiac issues.  No personal family cardiac history of MIs at young age.  Patient denies any drug use. Patient is afebrile, non-toxic appearing, sitting comfortably on examination table. Vital signs reviewed and stable.  Patient with no evidence of respiratory distress.  O2 sats are 100% on room air.   Consider ACS etiology versus musculoskeletal pain versus acute infectious etiology.  History/physical exam not concerning for PE. Patient is PERC negative and is low risk for PE.  No further workup for PE needed.  We will plan to obtain basic labs and chest x-ray.  Labs and imaging reviewed.  Troponin negative.  CBC shows leukocytosis of 14.1.  BMP is unremarkable.  Urine pregnancy negative.  EKG shows sinus rhythm, rate 78 with sinus arrhythmia.  No evidence of ST elevations.  Chest x-ray concerning for early pneumonia on the left lung.   Discussed results with patient.  We discussed observing since symptoms are started today but patient will go ahead and like to be treated with antibiotic therapy.  Patient refused analgesics.  I offered multiple forms of analgesics and patient declined at this time.  Vital signs are stable.  Patient able to ambulate in the department without any evidence of hypoxia.  Patient with no respiratory distress.  We will plan to start patient on back.  Concerning her choice.  Patient cannot take azithromycin given that she is Artie on Seroquel and concern for QT prolongation.  The recommends starting patient on doxycycline.  Patient had a BMP for evaluation.  We will plan to send her back function panel for evaluation.  Hepatic function panel within normal limits.  We will plan to start patient on doxycycline for treatment of pneumonia. Patient had ample opportunity for questions and discussion. All patient's questions were answered with full understanding. Strict return precautions discussed. Patient expresses understanding and agreement to plan.    Final Clinical Impressions(s) / ED Diagnoses   Final diagnoses:  Chest pain, unspecified type  Community acquired pneumonia, unspecified laterality    ED Discharge Orders        Ordered    doxycycline (VIBRAMYCIN) 100 MG capsule  2 times daily     05/30/17 2223       Maxwell Caul, PA-C 06/01/17 0316    Tilden Fossa, MD 06/01/17 [phone]

## 2017-05-30 NOTE — ED Notes (Signed)
Patient ambulated in hallway with Pulse Ox O2 stayed at 99-100% and HR 107

## 2017-05-30 NOTE — ED Notes (Signed)
Pt remains NSR on monitor, no ectopy, no arrhythmia since arrival.

## 2017-05-30 NOTE — ED Notes (Signed)
Attempted to obtain labs x 2; unable to obtain

## 2017-05-30 NOTE — Discharge Instructions (Signed)
Take antibiotics as directed. Please take all of your antibiotics until finished.  You can take Tylenol or Ibuprofen as directed for pain. You can alternate Tylenol and Ibuprofen every 4 hours. If you take Tylenol at 1pm, then you can take Ibuprofen at 5pm. Then you can take Tylenol again at 9pm.   Follow-up with your primary care doctor in the next 24-48 hours for further breathing, fever, swelling of your legs, nausea or vomiting or any other worsening or concerning symptoms.

## 2017-05-30 NOTE — ED Triage Notes (Signed)
C/o CP x today-states she has a cardiac arhythmia but does not know dx-NAD-steady gait

## 2017-05-31 NOTE — Telephone Encounter (Signed)
Pt scheduled for 06/01/17 for nexplanon insertion.

## 2017-06-01 ENCOUNTER — Encounter: Payer: Self-pay | Admitting: Osteopathic Medicine

## 2017-06-01 ENCOUNTER — Ambulatory Visit (INDEPENDENT_AMBULATORY_CARE_PROVIDER_SITE_OTHER): Payer: BLUE CROSS/BLUE SHIELD | Admitting: Osteopathic Medicine

## 2017-06-01 ENCOUNTER — Telehealth (HOSPITAL_COMMUNITY): Payer: Self-pay

## 2017-06-01 ENCOUNTER — Telehealth: Payer: Self-pay | Admitting: Family Medicine

## 2017-06-01 VITALS — BP 138/89 | HR 103 | Temp 98.2°F | Wt 272.5 lb

## 2017-06-01 DIAGNOSIS — Z30017 Encounter for initial prescription of implantable subdermal contraceptive: Secondary | ICD-10-CM | POA: Diagnosis not present

## 2017-06-01 MED ORDER — ETONOGESTREL 68 MG ~~LOC~~ IMPL: 1.0000 | DRUG_IMPLANT | Freq: Once | SUBCUTANEOUS | 0 refills | Status: DC

## 2017-06-01 NOTE — Telephone Encounter (Signed)
I do not write her Lamictal so she will have to call Dr. Gilmore LarocheAkhtar for this.  We can call for early approval to refill the Belsomra.

## 2017-06-01 NOTE — Telephone Encounter (Signed)
Pt advised and pharmacy advised that Belsomra is OK to fill early. She will contact [facility] regarding the Lamictal.

## 2017-06-01 NOTE — Telephone Encounter (Signed)
Patient called stating she lost her bottle of Lamictal (90 day supply) that Dr. Gilmore LarocheAkhtar prescribed her on 05/11/17. She wants to see about getting another refill. Patient says she has missed two doses already. Please review and advise.

## 2017-06-01 NOTE — Patient Instructions (Signed)
Nexplanon Instructions After Insertion  Keep bandage clean and dry for 24 hours  May use ice/Tylenol/Ibuprofen for soreness or pain  If you develop fever, drainage or increased warmth from incision site-contact office immediately   

## 2017-06-01 NOTE — Progress Notes (Signed)
HPI: Tonya Clark is a 22 y.o. female who  has a past medical history of Arrhythmia, Jaundice, neonatal, Kidney stone, and Premature birth.  [facility], 06/01/17,  for chief complaint of:  Chief Complaint  Patient presents with  . Contraception    Here today at the request of PCP for Nexplanon insertion. She tried oral contraceptive pills but this was not working for her, could not remember to take the medication. She is nervous for other methods such at vaginal ring, IUD   Past medical, surgical, social and family history reviewed:  Patient Active Problem List   Diagnosis Date Noted  . Moderate right ankle sprain 10/15/2016  . PAC (premature atrial contraction) 10/09/2015  . PVC (premature ventricular contraction) 10/09/2015  . Ear ringing 02/22/2014  . Major depression, recurrent, chronic (HCC) 08/21/2013  . Insomnia 08/21/2013  . GAD (generalized anxiety disorder) 03/30/2013    Past Surgical History:  Procedure Laterality Date  . TONSILLECTOMY  2009    Social History   Tobacco Use  . Smoking status: Never Smoker  . Smokeless tobacco: Never Used  Substance Use Topics  . Alcohol use: No    Family History  Problem Relation Age of Onset  . Hypertension Mother   . Allergies Mother   . Anxiety disorder Father   . Hypertension Father      Current medication list and allergy/intolerance information reviewed:    Current Outpatient Medications  Medication Sig Dispense Refill  . doxycycline (VIBRAMYCIN) 100 MG capsule Take 1 capsule (100 mg total) by mouth 2 (two) times daily. 20 capsule 0  . ferrous sulfate 325 (65 FE) MG tablet Take by mouth.    Marland Kitchen. FLUoxetine (PROZAC) 20 MG tablet Take by mouth.    . hydrOXYzine (VISTARIL) 25 MG capsule TAKE 1 CAPSULE BY MOUTH EVERY SIX HOURS AS NEEDED  0  . lamoTRIgine (LAMICTAL) 25 MG tablet Take 3 tablets (75 mg total) by mouth 2 (two) times daily. 540 tablet 0  .  loratadine (CLARITIN) 10 MG tablet Take by mouth.    . Melatonin 1 MG CAPS Take by mouth.    . metoprolol succinate (TOPROL-XL) 25 MG 24 hr tablet TAKE 1 TABLET (25 MG TOTAL) BY MOUTH DAILY. 90 tablet 1  . QUEtiapine (SEROQUEL) 100 MG tablet Take 1.5 tablets (150 mg total) by mouth at bedtime. Delete the 200mg .  Dose is lowered to 150mg  45 tablet 1  . Suvorexant (BELSOMRA) 15 MG TABS Take 1 tablet by mouth at bedtime. 90 tablet 1  . etonogestrel (NEXPLANON) 68 MG IMPL implant 1 each (68 mg total) by Subdermal route once for 1 dose. Inserted 06/01/17 1 each 0   No current facility-administered medications for this visit.     No Known Allergies    Review of Systems:  Constitutional:  No  fever, no chills, feeling well today  Cardiac: No  chest pain  Respiratory:  No  shortness of breath.  Musculoskeletal: No new myalgia/arthralgia  Genitourinary: No  incontinence, No  abnormal genital bleeding, No abnormal genital discharge  Skin: No  Rash   Exam:  BP 138/89   Pulse (!) 103   Temp 98.2 F (36.8 C) (Oral)   Wt 272 lb 8 oz (123.6 kg)   LMP 05/12/2017   BMI 43.98 kg/m   Constitutional: VS see above. General Appearance: alert, well-developed, well-nourished, NAD  Eyes: Normal lids and conjunctive, non-icteric sclera  Ears, Nose, Mouth, Throat: MMM, Normal external  inspection ears/nares/mouth/lips/gums.   Neck: No masses, trachea midline.   Respiratory: Normal respiratory effort.   Musculoskeletal: Gait normal.   Neurological: Normal balance/coordination. No tremor.   Skin: warm, dry, intact. No rash/ulcer. No concerning nevi or subq nodules on limited exam.    Psychiatric: Normal judgment/insight. Normal mood and affect. Oriented x3.   Results for orders placed or performed in visit on 06/01/17 (from the past 24 hour(s))  POCT urine pregnancy     Status: None   Collection Time: 06/02/17 11:42 AM  Result Value Ref Range   Preg Test, Ur Negative Negative       ASSESSMENT/PLAN:   Nexplanon insertion - What well, discussed need for 7 days of backup contraception/abstinence - Plan: POCT urine pregnancy, etonogestrel (NEXPLANON) 68 MG IMPL implant   NEXPLANON INSERTION PRE-OP DIAGNOSIS: desired long-term, reversible contraception  POST-OP DIAGNOSIS: Same  PROCEDURE: Nexplanon  placement Performing Physician: Dr. Sunnie Nielsen  Risks and benefits reviewed with the patient. Informed consent obtained, patient opts to proceed today. See scanned documents for signed consent form   PROCEDURE:  Site (check): left arm  Sterile Preparation: Chlorhexidine   Insertion site was selected 8 - 10 cm from medial epicondyle and marked along with guiding site using sterile marker  Procedure area was prepped and draped in a sterile fashion. 3mL of 1% lidocaine with epinephrine used for subcutaneous anesthesia. Anesthesia confirmed.  Nexplanon  trocar was inserted subcutaneously and then Nexplanon  capsule delivered subcutaneously Trocar was removed from the insertion site. Nexplanon  capsule was palpated by provider and patient to assure satisfactory placement. Estimated blood loss <1 mL Dressings applied: Steri-Strip and small pressure bandage Followup: The patient tolerated the procedure well without complications.  Standard post-procedure care is explained and return precautions are given.      Patient Instructions  Nexplanon Instructions After Insertion   Keep bandage clean and dry for 24 hours   May use ice/Tylenol/Ibuprofen for soreness or pain   If you develop fever, drainage or increased warmth from incision site-contact office immediately        Visit summary with medication list and pertinent instructions was printed for patient to review. All questions at time of visit were answered - patient instructed to contact office with any additional concerns. ER/RTC precautions were reviewed with the patient.   Follow-up plan:  Return for recheck as needed.  Note: Total time spent 25 minutes, greater than 50% of the visit was spent face-to-face counseling and coordinating care for the following: The encounter diagnosis was Nexplanon insertion..  Please note: voice recognition software was used to produce this document, and typos may escape review. Please contact Dr. Lyn Hollingshead for any needed clarifications.

## 2017-06-01 NOTE — Telephone Encounter (Signed)
Pt called to state she accidentally threw her medicine away. The pharmacy was able to do early refills on all but two. They advised she would need a 90 day Rx for the Lamictal and we would have to call with an approval to refill the Belsomra early since it is a controlled substance.  Called pharmacy, they agree with above. Will route to PCP for approval.

## 2017-06-02 ENCOUNTER — Ambulatory Visit (INDEPENDENT_AMBULATORY_CARE_PROVIDER_SITE_OTHER): Payer: BLUE CROSS/BLUE SHIELD | Admitting: Family Medicine

## 2017-06-02 ENCOUNTER — Ambulatory Visit (INDEPENDENT_AMBULATORY_CARE_PROVIDER_SITE_OTHER): Payer: BLUE CROSS/BLUE SHIELD | Admitting: Psychiatry

## 2017-06-02 ENCOUNTER — Encounter: Payer: Self-pay | Admitting: Family Medicine

## 2017-06-02 ENCOUNTER — Encounter: Payer: Self-pay | Admitting: Osteopathic Medicine

## 2017-06-02 ENCOUNTER — Other Ambulatory Visit: Payer: Self-pay

## 2017-06-02 ENCOUNTER — Encounter (HOSPITAL_COMMUNITY): Payer: Self-pay | Admitting: Psychiatry

## 2017-06-02 VITALS — BP 112/84 | HR 106 | Ht 66.0 in | Wt 269.0 lb

## 2017-06-02 VITALS — BP 120/65 | HR 82 | Ht 66.0 in | Wt 269.0 lb

## 2017-06-02 DIAGNOSIS — F319 Bipolar disorder, unspecified: Secondary | ICD-10-CM

## 2017-06-02 DIAGNOSIS — Z818 Family history of other mental and behavioral disorders: Secondary | ICD-10-CM

## 2017-06-02 DIAGNOSIS — J189 Pneumonia, unspecified organism: Secondary | ICD-10-CM | POA: Diagnosis not present

## 2017-06-02 DIAGNOSIS — G4700 Insomnia, unspecified: Secondary | ICD-10-CM

## 2017-06-02 DIAGNOSIS — F5102 Adjustment insomnia: Secondary | ICD-10-CM

## 2017-06-02 DIAGNOSIS — F3160 Bipolar disorder, current episode mixed, unspecified: Secondary | ICD-10-CM

## 2017-06-02 DIAGNOSIS — F411 Generalized anxiety disorder: Secondary | ICD-10-CM | POA: Diagnosis not present

## 2017-06-02 LAB — POCT URINE PREGNANCY: Preg Test, Ur: NEGATIVE

## 2017-06-02 MED ORDER — LAMOTRIGINE 150 MG PO TABS: 150.0000 mg | ORAL_TABLET | Freq: Every day | ORAL | 0 refills | Status: DC

## 2017-06-02 NOTE — Progress Notes (Signed)
BH MD/PA/NP OP Progress Note  06/02/2017 2:02 PM Tonya Clark  MRN:  [id number]  Chief Complaint:   HPI: 22 years old white female. Student of [employer] [facility] and [facility] thoughts  Doing fair. Lost her prescriptions. Want the 90 day supply of lamictal Wanting to back to school. Doesn't like at home  sleeps reasonable on blesorma   Aggravating factor:school stress Has dropped some classes Modifying factor: keeping busy Timing": more when home or around stress  Not psychotic Visit Diagnosis:    ICD-10-CM   1. Bipolar I disorder, most recent episode mixed (HCC) F31.60   2. GAD (generalized anxiety disorder) F41.1   3. Adjustment insomnia F51.02     Past Psychiatric History: mood disorder, anxiety  Past Medical History:  Past Medical History:  Diagnosis Date  . Arrhythmia   . Jaundice, neonatal   . Kidney stone   . Premature birth    37 weeks    Past Surgical History:  Procedure Laterality Date  . TONSILLECTOMY  2009    Family Psychiatric History: see  below  Family History:  Family History  Problem Relation Age of Onset  . Hypertension Mother   . Allergies Mother   . Anxiety disorder Father   . Hypertension Father     Social History:  Social History   Socioeconomic History  . Marital status: Single    Spouse name: None  . Number of children: None  . Years of education: None  . Highest education level: None  Social Needs  . Financial resource strain: None  . Food insecurity - worry: None  . Food insecurity - inability: None  . Transportation needs - medical: None  . Transportation needs - non-medical: None  Occupational History  . Occupation: Consulting civil engineer  Tobacco Use  . Smoking status: Never Smoker  . Smokeless tobacco: Never Used  Substance and Sexual Activity  . Alcohol use: No  . Drug use: No  . Sexual activity: None  Other Topics Concern  . None  Social History Narrative   Was home schooled for  HS. She is now working at a [employer] 2-3 times per week. Born in [place].     Allergies: No Known Allergies  Metabolic Disorder Labs: No results found for: HGBA1C, MPG No results found for: PROLACTIN No results found for: CHOL, TRIG, HDL, CHOLHDL, VLDL, LDLCALC Lab Results  Component Value Date   TSH 1.34 08/25/2015   TSH 1.321 03/30/2013    Therapeutic Level Labs: No results found for: LITHIUM No results found for: VALPROATE No components found for:  CBMZ  Current Medications: Current Outpatient Medications  Medication Sig Dispense Refill  . doxycycline (VIBRAMYCIN) 100 MG capsule Take 1 capsule (100 mg total) by mouth 2 (two) times daily. 20 capsule 0  . hydrOXYzine (VISTARIL) 25 MG capsule TAKE 1 CAPSULE BY MOUTH EVERY SIX HOURS AS NEEDED  0  . lamoTRIgine (LAMICTAL) 150 MG tablet Take 1 tablet (150 mg total) by mouth daily. 90 tablet 0  . loratadine (CLARITIN) 10 MG tablet Take by mouth.    . metoprolol succinate (TOPROL-XL) 25 MG 24 hr tablet TAKE 1 TABLET (25 MG TOTAL) BY MOUTH DAILY. 90 tablet 1  . QUEtiapine (SEROQUEL) 100 MG tablet Take 1.5 tablets (150 mg total) by mouth at bedtime. Delete the 200mg .  Dose is lowered to 150mg  45 tablet 1  . Suvorexant (BELSOMRA) 15 MG TABS Take 1 tablet by mouth at bedtime. 90 tablet 1  .  etonogestrel (NEXPLANON) 68 MG IMPL implant 1 each (68 mg total) by Subdermal route once for 1 dose. Inserted 06/01/17 1 each 0  . ferrous sulfate 325 (65 FE) MG tablet Take by mouth.    . Melatonin 1 MG CAPS Take by mouth.     No current facility-administered medications for this visit.      Musculoskeletal: Strength & Muscle Tone: within normal limits Gait & Station: normal Patient leans: no lean  Psychiatric Specialty Exam: Review of Systems  Cardiovascular: Negative for palpitations.  Skin: Negative for itching.  Psychiatric/Behavioral: Negative for substance abuse and suicidal ideas.    Blood pressure 112/84, pulse (!)  106, height 5\' 6"  (1.676 m), weight 269 lb (122 kg), last menstrual period 05/12/2017.Body mass index is 43.42 kg/m.  General Appearance: Casual  Eye Contact:  Fair  Speech:  Slow  Volume:  Normal  Mood: fair  Affect:  congruent  Thought Process:  Goal Directed  Orientation:  Full (Time, Place, and Person)  Thought Content: Rumination   Suicidal Thoughts:  No  Homicidal Thoughts:  No  Memory:  Immediate;   Fair Recent;   Fair  Judgement:  Fair  Insight:  Shallow  Psychomotor Activity:  Normal  Concentration:  Concentration: Fair and Attention Span: Fair  Recall:  [employer]is[employer]Fair  Fund of Knowledge: Fair  Language: Fair  Akathisia:  Negative  Handed:  Right  AIMS (if indicated): zero  Assets:  Desire for Improvement  ADL's:  Intact  Cognition: WNL  Sleep:  Fair   Screenings: GAD-7     Office Visit from 06/02/2017 in [facility] [place] Office Visit from 03/03/2017 in [facility] [place] Office Visit from 12/27/2016 in [facility] [place] Office Visit from 12/13/2016 in [facility] [place] Office Visit from 11/23/2016 in [facility] [place]  Total GAD-7 Score  6  6  4  9  8     PHQ2-9     Office Visit from 06/02/2017 in [facility] [place] Office Visit from 03/03/2017 in [facility] [place] Office Visit from 12/27/2016 in [facility] [place] Office Visit from 12/13/2016 in [facility] [place] Office Visit from 11/23/2016 in  [facility]   PHQ-2 Total Score  0  4  4  4  3   PHQ-9 Total Score  6  17  9  12  11        Assessment and Plan: Bipolar or mood disorder, says taklng lamictal 150mg . Will renew change to one tablet. Fogginess improved since lower seroquel. can continue  GAD: stress at home. Continue meds, vistaril  if needed  Sleep: reviewed sleep hygiene. On blesorma and seroquel. No side effects   Continue therapy with Tasia Catchingsraig. Fu 2 months. Need to be careful of keeping meds. Reviewed side effects, if rash to stop and call.   Thresa RossNadeem Russell Engelstad, MD 06/02/2017, 2:02 PM

## 2017-06-02 NOTE — Telephone Encounter (Signed)
She is scheduled today. Will review

## 2017-06-02 NOTE — Progress Notes (Signed)
Subjective:    Patient ID: Tonya EdwardsMichaela Clark, female    DOB: Sep 06, 1995, 22 y.o.   MRN: [id number]  HPI Follow-up depression/anxiety-she was hospitalized back in November and was diagnosed with bipolar 1.  She is currently under regimen with Seroquel and Lamictal.  She says she is doing okay on her medications and does not complaining of any significant side effects.  Follow-up insomnia-he is doing well with the Belsomra and has not had any problems.  Though she accidentally throughout her medications a couple days ago but we did send in a new prescription for early refill.  She has not had any problems or side effects.  She also just had Nexplanon inserted yesterday.  She was also seen in the emergency department on July 7.  She was experiencing some chest pain and went into be evaluated.  They ruled out cardiac causes but she did have a mildly elevated white blood cell count around 14,000 as well as a questionable area on the chest x-ray so they decided to treat her for possible early pneumonia with doxycycline.  She is feeling great and doing well.  She denied any fevers chills or shortness of breath or cough.  She is taking the antibiotic.   Review of Systems BP 120/65   Pulse 82   Ht 5\' 6"  (1.676 m)   Wt 269 lb (122 kg)   LMP 05/12/2017   SpO2 98%   BMI 43.42 kg/m     No Known Allergies  Past Medical History:  Diagnosis Date  . Arrhythmia   . Jaundice, neonatal   . Kidney stone   . Premature birth    37 weeks    Past Surgical History:  Procedure Laterality Date  . TONSILLECTOMY  2009    Social History   Socioeconomic History  . Marital status: Single    Spouse name: Not on file  . Number of children: Not on file  . Years of education: Not on file  . Highest education level: Not on file  Social Needs  . Financial resource strain: Not on file  . Food insecurity - worry: Not on file  . Food insecurity - inability: Not on file  . Transportation needs - medical: Not  on file  . Transportation needs - non-medical: Not on file  Occupational History  . Occupation: Consulting civil engineertudent  Tobacco Use  . Smoking status: Never Smoker  . Smokeless tobacco: Never Used  Substance and Sexual Activity  . Alcohol use: No  . Drug use: No  . Sexual activity: Not on file  Other Topics Concern  . Not on file  Social History Narrative   Was home schooled for HS. She is now working at a [employer] 2-3 times per week. [place]     Family History  Problem Relation Age of Onset  . Hypertension Mother   . Allergies Mother   . Anxiety disorder Father   . Hypertension Father     Outpatient Encounter Medications as of 06/02/2017  Medication Sig  . doxycycline (VIBRAMYCIN) 100 MG capsule Take 1 capsule (100 mg total) by mouth 2 (two) times daily.  . ferrous sulfate 325 (65 FE) MG tablet Take by mouth.  Marland Kitchen. FLUoxetine (PROZAC) 20 MG tablet Take by mouth.  . hydrOXYzine (VISTARIL) 25 MG capsule TAKE 1 CAPSULE BY MOUTH EVERY SIX HOURS AS NEEDED  . lamoTRIgine (LAMICTAL) 25 MG tablet Take 3 tablets (75 mg total) by mouth 2 (two) times daily.  .Marland Kitchen  loratadine (CLARITIN) 10 MG tablet Take by mouth.  . Melatonin 1 MG CAPS Take by mouth.  . metoprolol succinate (TOPROL-XL) 25 MG 24 hr tablet TAKE 1 TABLET (25 MG TOTAL) BY MOUTH DAILY.  Marland Kitchen QUEtiapine (SEROQUEL) 100 MG tablet Take 1.5 tablets (150 mg total) by mouth at bedtime. Delete the 200mg .  Dose is lowered to 150mg   . Suvorexant (BELSOMRA) 15 MG TABS Take 1 tablet by mouth at bedtime.  . [DISCONTINUED] KARIVA 0.15-0.02/0.01 MG (21/5) tablet Take 1 tablet by mouth daily.  Marland Kitchen etonogestrel (NEXPLANON) 68 MG IMPL implant 1 each (68 mg total) by Subdermal route once for 1 dose. Inserted 06/01/17   No facility-administered encounter medications on file as of 06/02/2017.          Objective:   Physical Exam  Constitutional: She is oriented to person, place, and time. She appears well-developed and well-nourished.  HENT:   Head: Normocephalic and atraumatic.  Cardiovascular: Normal rate, regular rhythm and normal heart sounds.  Pulmonary/Chest: Effort normal and breath sounds normal.  Neurological: She is alert and oriented to person, place, and time.  Skin: Skin is warm and dry.  Psychiatric: She has a normal mood and affect. Her behavior is normal.       Assessment & Plan:  Bipolar 1 disorder--she has a follow-up appoint with Dr. Gilmore Laroche later today and will get her Lamictal filled.  Evidently she excellently throughout her medications a couple of days ago.  We will need to monitor blood glucose at follow-up in 6 months.  Insomnia-stable on Belsomra.  Continue current regimen.  F/U pneumonia -possible early pneumonia though she is all she did have a mildly elevated white count at the time.  Did encourage her just to go ahead and complete the doxycycline that she is Artie started the regimen.  Lungs are clear on exam today.

## 2017-06-02 NOTE — Telephone Encounter (Signed)
Informed patient. Nothing further needed at this time.

## 2017-06-22 ENCOUNTER — Other Ambulatory Visit (HOSPITAL_COMMUNITY): Payer: Self-pay | Admitting: Psychiatry

## 2017-06-24 ENCOUNTER — Telehealth: Payer: Self-pay | Admitting: Family Medicine

## 2017-06-24 NOTE — Telephone Encounter (Signed)
Pt called clinic stating she needs a [facility] Pt will send over records if PCP does not have them. Pt was advised an appointment would be the best option to get this completed. 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 If she waits, they will revoke her license. Routing to PCP to see if she is OK to complete form without face to face visit. Pt reports form is due by 07/14/17, she would like it completed before then if possible.

## 2017-06-24 NOTE — Telephone Encounter (Signed)
Left VM updating Pt.  

## 2017-06-24 NOTE — Telephone Encounter (Signed)
I am okay to complete forms without office visit as long as she can get me the note so I can make sure I can properly fill out her forms.

## 2017-06-27 ENCOUNTER — Encounter: Payer: Self-pay | Admitting: Family Medicine

## 2017-06-27 NOTE — Telephone Encounter (Signed)
Left second VM for Pt.

## 2017-06-28 DIAGNOSIS — F3162 Bipolar disorder, current episode mixed, moderate: Secondary | ICD-10-CM | POA: Diagnosis not present

## 2017-07-01 ENCOUNTER — Telehealth (HOSPITAL_COMMUNITY): Payer: Self-pay | Admitting: Psychiatry

## 2017-07-01 DIAGNOSIS — F3160 Bipolar disorder, current episode mixed, unspecified: Secondary | ICD-10-CM

## 2017-07-01 MED ORDER — QUETIAPINE FUMARATE 100 MG PO TABS: 150.0000 mg | ORAL_TABLET | Freq: Every day | ORAL | 0 refills | Status: DC

## 2017-07-01 NOTE — Telephone Encounter (Signed)
Per dr Gilmore Larocheakhtar, ok to fill

## 2017-07-01 NOTE — Telephone Encounter (Signed)
Medication management - Met with Dr. De Nurse who approved a one time refill of patient's prescribed Seroquel. New order printed by mistake so called in one time 30 day order to pt's [facility] [address] with Cordelia Pen, pharmacist this date. Called patient to inform the order was sent in for her and reminded her of scheduled next appointment 07/25/17 at 1pm.

## 2017-07-01 NOTE — Telephone Encounter (Signed)
Pt is out of seroquel. [facility]

## 2017-07-04 ENCOUNTER — Other Ambulatory Visit (HOSPITAL_COMMUNITY): Payer: Self-pay

## 2017-07-04 ENCOUNTER — Telehealth (HOSPITAL_COMMUNITY): Payer: Self-pay

## 2017-07-04 ENCOUNTER — Telehealth: Payer: Self-pay | Admitting: *Deleted

## 2017-07-04 DIAGNOSIS — F3160 Bipolar disorder, current episode mixed, unspecified: Secondary | ICD-10-CM

## 2017-07-04 MED ORDER — QUETIAPINE FUMARATE 100 MG PO TABS: 150.0000 mg | ORAL_TABLET | Freq: Every day | ORAL | 0 refills | Status: DC

## 2017-07-04 NOTE — Telephone Encounter (Signed)
Pt's forms completed, copied,scanned,faxed, confirmation received.Heath GoldBarkley, Teasia Zapf [name], CMA

## 2017-07-04 NOTE — Telephone Encounter (Signed)
lvm informing pt that the forms have been completed and they have been faxed and mailed. Should she need a copy there will be one at the front desk for her to p/u at her convenience.Heath GoldBarkley, Kunta Hilleary [name], CMA

## 2017-07-04 NOTE — Telephone Encounter (Signed)
Patients' mom called in really upset because the Seroquel was not sent in for a 90 day supply so the insurance would pay for. I told her that I will resend it for a 90 day supply, in which I did. Nothing further is needed at this time.

## 2017-07-04 NOTE — Telephone Encounter (Signed)
Pt's completed forms mailed to   [facility] [address] [place] [id number]  .Marland Kitchen.Marland Kitchen.Heath GoldBarkley, Lurine Imel [name], CMA

## 2017-07-05 DIAGNOSIS — F3162 Bipolar disorder, current episode mixed, moderate: Secondary | ICD-10-CM | POA: Diagnosis not present

## 2017-07-09 DIAGNOSIS — R071 Chest pain on breathing: Secondary | ICD-10-CM | POA: Diagnosis not present

## 2017-07-09 DIAGNOSIS — R091 Pleurisy: Secondary | ICD-10-CM | POA: Diagnosis not present

## 2017-07-12 DIAGNOSIS — F3162 Bipolar disorder, current episode mixed, moderate: Secondary | ICD-10-CM | POA: Diagnosis not present

## 2017-07-19 DIAGNOSIS — F3162 Bipolar disorder, current episode mixed, moderate: Secondary | ICD-10-CM | POA: Diagnosis not present

## 2017-07-20 ENCOUNTER — Telehealth (HOSPITAL_COMMUNITY): Payer: Self-pay | Admitting: Psychiatry

## 2017-07-20 ENCOUNTER — Telehealth: Payer: Self-pay

## 2017-07-20 NOTE — Telephone Encounter (Signed)
Usually lamictal rash happens when lamictal is started. So we keep dose low. It appears she was already taking 150mg  for a while so not sure if there was gap of few days and she started on lamictal at this dose.  Either way its better to keep off from lamictal. Will talk at appointment. Continue seroquel. We can start back lamictal later on a small starter dose Call back for conerns

## 2017-07-20 NOTE — Telephone Encounter (Signed)
Spoke to patient. Informed her of what Dr. Gilmore LarocheAkhtar recommends. She shows understanding. Nothing further needed at this time.

## 2017-07-20 NOTE — Telephone Encounter (Signed)
Pt has broke out with a rash across her face. She went to primary care, and the informed her it was due to lamictal.  Pt was told to give us a call and see what dr Gilmore Larocheakhtar recommends.   Please advise.

## 2017-07-20 NOTE — Telephone Encounter (Signed)
Patient called in advising that Dr. Gilmore LarocheAkhtar advised to discontinue taking Lamictal due to rash on her face. Patient wanted provider to know and what can be Rx in its place.

## 2017-07-21 NOTE — Telephone Encounter (Signed)
She needs to call Dr. Gilmore LarocheAkhtar and see what he would recommend.  Since he is managing her medication.

## 2017-07-21 NOTE — Telephone Encounter (Signed)
Pt advised.

## 2017-07-25 ENCOUNTER — Ambulatory Visit (INDEPENDENT_AMBULATORY_CARE_PROVIDER_SITE_OTHER): Payer: BLUE CROSS/BLUE SHIELD | Admitting: Psychiatry

## 2017-07-25 ENCOUNTER — Encounter (HOSPITAL_COMMUNITY): Payer: Self-pay | Admitting: Psychiatry

## 2017-07-25 VITALS — BP 130/86 | Ht 66.0 in | Wt 257.0 lb

## 2017-07-25 DIAGNOSIS — F3160 Bipolar disorder, current episode mixed, unspecified: Secondary | ICD-10-CM | POA: Diagnosis not present

## 2017-07-25 DIAGNOSIS — F411 Generalized anxiety disorder: Secondary | ICD-10-CM | POA: Diagnosis not present

## 2017-07-25 DIAGNOSIS — R450 Nervousness: Secondary | ICD-10-CM

## 2017-07-25 DIAGNOSIS — F5102 Adjustment insomnia: Secondary | ICD-10-CM | POA: Diagnosis not present

## 2017-07-25 MED ORDER — BUSPIRONE HCL 7.5 MG PO TABS: 7.5000 mg | ORAL_TABLET | Freq: Every day | ORAL | 0 refills | Status: DC

## 2017-07-25 MED ORDER — LAMOTRIGINE 25 MG PO TABS: 25.0000 mg | ORAL_TABLET | Freq: Every day | ORAL | 0 refills | Status: DC

## 2017-07-25 NOTE — Progress Notes (Signed)
BH MD/PA/NP OP Progress Note  07/25/2017 1:16 PM Tonya EdwardsMichaela Farina  MRN:  [id number]  Chief Complaint:  Chief Complaint    Follow-up; Other     HPI: 22 years old white female. Student of [employer] [facility] and [facility]  lamictal was stopped prior visit concern of rash at that time dose was 150mg .   no rash as of now  less foggy on lower seroquel Feels anxious, free floating anxiety Sleep fair without melatonin and blesorma    Aggravating factor:school stressed Has dropped some classes Modifying factor:keeping busy Timing": more when home or around stress  Not psychotic Visit Diagnosis:    ICD-10-CM   1. Bipolar I disorder, most recent episode mixed (HCC) F31.60   2. GAD (generalized anxiety disorder) F41.1   3. Adjustment insomnia F51.02     Past Psychiatric History: mood disorder, anxiety  Past Medical History:  Past Medical History:  Diagnosis Date  . Arrhythmia   . Jaundice, neonatal   . Kidney stone   . Premature birth    37 weeks    Past Surgical History:  Procedure Laterality Date  . TONSILLECTOMY  2009    Family Psychiatric History: see  below  Family History:  Family History  Problem Relation Age of Onset  . Hypertension Mother   . Allergies Mother   . Anxiety disorder Father   . Hypertension Father     Social History:  Social History   Socioeconomic History  . Marital status: Single    Spouse name: None  . Number of children: None  . Years of education: None  . Highest education level: None  Social Needs  . Financial resource strain: None  . Food insecurity - worry: None  . Food insecurity - inability: None  . Transportation needs - medical: None  . Transportation needs - non-medical: None  Occupational History  . Occupation: Consulting civil engineertudent  Tobacco Use  . Smoking status: Never Smoker  . Smokeless tobacco: Never Used  Substance and Sexual Activity  . Alcohol use: No  . Drug use: No  .  Sexual activity: None  Other Topics Concern  . None  Social History Narrative   Was home schooled for HS. She is now working at a [employer] 2[employer]3 times per week. [place]     Allergies:  Allergies  Allergen Reactions  . Lamictal [Lamotrigine] Rash    Metabolic Disorder Labs: No results found for: HGBA1C, MPG No results found for: PROLACTIN No results found for: CHOL, TRIG, HDL, CHOLHDL, VLDL, LDLCALC Lab Results  Component Value Date   TSH 1.34 08/25/2015   TSH 1.321 03/30/2013    Therapeutic Level Labs: No results found for: LITHIUM No results found for: VALPROATE No components found for:  CBMZ  Current Medications: Current Outpatient Medications  Medication Sig Dispense Refill  . doxycycline (VIBRAMYCIN) 100 MG capsule Take 1 capsule (100 mg total) by mouth 2 (two) times daily. 20 capsule 0  . ferrous sulfate 325 (65 FE) MG tablet Take by mouth.    . hydrOXYzine (VISTARIL) 25 MG capsule TAKE 1 CAPSULE BY MOUTH EVERY SIX HOURS AS NEEDED  0  . loratadine (CLARITIN) 10 MG tablet Take by mouth.    . metoprolol succinate (TOPROL-XL) 25 MG 24 hr tablet TAKE 1 TABLET (25 MG TOTAL) BY MOUTH DAILY. 90 tablet 1  . QUEtiapine (SEROQUEL) 100 MG tablet Take 1.5 tablets (150 mg total) by mouth at bedtime. 135 tablet 0  .  busPIRone (BUSPAR) 7.5 MG tablet Take 1 tablet (7.5 mg total) by mouth daily. 30 tablet 0  . etonogestrel (NEXPLANON) 68 MG IMPL implant 1 each (68 mg total) by Subdermal route once for 1 dose. Inserted 06/01/17 1 each 0  . lamoTRIgine (LAMICTAL) 25 MG tablet Take 1 tablet (25 mg total) by mouth daily. Take one tablet daily for a week and then start taking 2 tablets. 60 tablet 0  . Melatonin 1 MG CAPS Take by mouth.    . Suvorexant (BELSOMRA) 15 MG TABS Take 1 tablet by mouth at bedtime. (Patient not taking: Reported on 07/25/2017) 90 tablet 1   No current facility-administered medications for this visit.      Musculoskeletal: Strength & Muscle  Tone: within normal limits Gait & Station: normal Patient leans: no lean  Psychiatric Specialty Exam: Review of Systems  Cardiovascular: Negative for palpitations.  Skin: Negative for itching.  Psychiatric/Behavioral: Negative for depression, substance abuse and suicidal ideas. The patient is nervous/anxious.     Blood pressure 130/86, height 5\' 6"  (1.676 m), weight 257 lb (116.6 kg).Body mass index is 41.48 kg/m.  General Appearance: Casual  Eye Contact:  Fair  Speech:  Slow  Volume:  Normal  Mood: stressed  Affect:  congruent  Thought Process:  Goal Directed  Orientation:  Full (Time, Place, and Person)  Thought Content: Rumination   Suicidal Thoughts:  No  Homicidal Thoughts:  No  Memory:  Immediate;   Fair Recent;   Fair  Judgement:  Fair  Insight:  Shallow  Psychomotor Activity:  Normal  Concentration:  Concentration: Fair and Attention Span: Fair  Recall:  [employer] of Knowledge: Fair  Language: Fair  Akathisia:  Negative  Handed:  Right  AIMS (if indicated): zero  Assets:  Desire for Improvement  ADL's:  Intact  Cognition: WNL  Sleep:  Fair   Screenings: GAD-7     Office Visit from 06/02/2017 in [facility] [place] Office Visit from 03/03/2017 in [place] [facility] [place] Office Visit from 12/27/2016 in [place] [facility] [place] Office Visit from 12/13/2016 in [facility] [facility] [place] Office Visit from 11/23/2016 in [facility] [place]  Total GAD-7 Score  6  6  4  9  8     PHQ2-9     Office Visit from 06/02/2017 in [facility] [place] Office Visit from 03/03/2017 in [place] [facility] [place] Office Visit from 12/27/2016 in [place] [facility] [place] Office Visit from 12/13/2016 in [place] [facility] [place] Office Visit from 11/23/2016 in [place] [facility]   PHQ-2 Total Score  0  4  4  4  3   PHQ-9 Total Score  6  17  9  12  11        Assessment and Plan: Bipolar or mood disorder,  Did not have rash for a while on lamictal.  We can re introduce a small dose of 25mg  in one week. In case have rash or concern again to stop immediately. Patient understands this is a small dose and starter dose. Not to incrase beyond 50mg  after and to start after one week from now   continue seroquel GAD: over stressed. Will start buspar once a day Sleep: doing fair on seroquel. Can continue Provided therapy and call back  In 7 days to tell  us how she is. Call 911 or ED for any urgent concerns or appearance of rash again    Thresa Ross, MD 07/25/2017, 1:16 PM

## 2017-08-01 ENCOUNTER — Telehealth (HOSPITAL_COMMUNITY): Payer: Self-pay

## 2017-08-01 NOTE — Telephone Encounter (Signed)
Tonya Clark called back stating that she only has Lamictal 150mg  left, and she really needs the medication today. Please review and advise.

## 2017-08-01 NOTE — Telephone Encounter (Signed)
Insurance will not cover 2X/day of the 25mg  of Lamotrigine. They also require a 90 day supply. Pharmacy want to know if you want to change to 50mg . Please review and advise.

## 2017-08-01 NOTE — Telephone Encounter (Signed)
Can change to 50mg  qd but it comes in 25mg  . Ask pharmacy what works if she can get 50mg  qd instead of 25mg  bid and prescribe

## 2017-08-01 NOTE — Telephone Encounter (Signed)
Left VM informing patient to call me back and let me know if she has any 25mg  of Lamictal left.

## 2017-08-01 NOTE — Telephone Encounter (Signed)
Pharmacy stated that they called and spoke with insurance company and they have the issue resolved. Nothing further is needed at this time.

## 2017-08-01 NOTE — Telephone Encounter (Signed)
Patient has had a rash before. Ask if she has the 25mg  left over and start once a day and increase to 50mg  in one week. Cannot give 90 days till sure of no rash

## 2017-08-03 ENCOUNTER — Telehealth (HOSPITAL_COMMUNITY): Payer: Self-pay | Admitting: Psychiatry

## 2017-08-03 NOTE — Telephone Encounter (Signed)
Pt calling to report she is very itchy and starting to have spots everywhere since starting the lamictal.   Please advise.  CB # [phone]

## 2017-08-03 NOTE — Telephone Encounter (Signed)
Called and left VM for patient informing her to stop Lamictal per Dr. Gilmore LarocheAkhtar and take OTC benadryl. I also let her know to report to her PCP or us if she needs to. Nothing further is needed at this time.

## 2017-08-03 NOTE — Telephone Encounter (Signed)
Stop lamictal even the lowest dose. Take benardyl for itching over the counter and report to PCP or us if needed.  Will discuss other options once itch is over

## 2017-08-09 ENCOUNTER — Other Ambulatory Visit (HOSPITAL_COMMUNITY): Payer: Self-pay | Admitting: Psychiatry

## 2017-08-09 DIAGNOSIS — F3162 Bipolar disorder, current episode mixed, moderate: Secondary | ICD-10-CM | POA: Diagnosis not present

## 2017-08-10 DIAGNOSIS — F3162 Bipolar disorder, current episode mixed, moderate: Secondary | ICD-10-CM | POA: Diagnosis not present

## 2017-08-16 DIAGNOSIS — F3162 Bipolar disorder, current episode mixed, moderate: Secondary | ICD-10-CM | POA: Diagnosis not present

## 2017-08-18 ENCOUNTER — Ambulatory Visit: Payer: BLUE CROSS/BLUE SHIELD | Admitting: Family Medicine

## 2017-08-24 DIAGNOSIS — F3162 Bipolar disorder, current episode mixed, moderate: Secondary | ICD-10-CM | POA: Diagnosis not present

## 2017-08-25 DIAGNOSIS — F3162 Bipolar disorder, current episode mixed, moderate: Secondary | ICD-10-CM | POA: Diagnosis not present

## 2017-08-25 DIAGNOSIS — L659 Nonscarring hair loss, unspecified: Secondary | ICD-10-CM | POA: Diagnosis not present

## 2017-08-25 DIAGNOSIS — R5382 Chronic fatigue, unspecified: Secondary | ICD-10-CM | POA: Diagnosis not present

## 2017-08-27 ENCOUNTER — Other Ambulatory Visit: Payer: Self-pay | Admitting: Family Medicine

## 2017-08-27 DIAGNOSIS — I491 Atrial premature depolarization: Secondary | ICD-10-CM

## 2017-08-27 DIAGNOSIS — I493 Ventricular premature depolarization: Secondary | ICD-10-CM

## 2017-08-28 ENCOUNTER — Other Ambulatory Visit (HOSPITAL_COMMUNITY): Payer: Self-pay | Admitting: Psychiatry

## 2017-09-05 DIAGNOSIS — E539 Vitamin B deficiency, unspecified: Secondary | ICD-10-CM | POA: Diagnosis not present

## 2017-09-05 DIAGNOSIS — F310 Bipolar disorder, current episode hypomanic: Secondary | ICD-10-CM | POA: Diagnosis not present

## 2017-09-05 DIAGNOSIS — F319 Bipolar disorder, unspecified: Secondary | ICD-10-CM | POA: Diagnosis not present

## 2017-09-05 DIAGNOSIS — R441 Visual hallucinations: Secondary | ICD-10-CM | POA: Diagnosis not present

## 2017-09-05 DIAGNOSIS — R45851 Suicidal ideations: Secondary | ICD-10-CM | POA: Diagnosis not present

## 2017-09-05 DIAGNOSIS — F329 Major depressive disorder, single episode, unspecified: Secondary | ICD-10-CM | POA: Diagnosis not present

## 2017-09-05 DIAGNOSIS — R442 Other hallucinations: Secondary | ICD-10-CM | POA: Diagnosis not present

## 2017-09-05 DIAGNOSIS — E663 Overweight: Secondary | ICD-10-CM | POA: Diagnosis not present

## 2017-09-05 DIAGNOSIS — Z6841 Body Mass Index (BMI) 40.0 and over, adult: Secondary | ICD-10-CM | POA: Diagnosis not present

## 2017-09-05 DIAGNOSIS — F062 Psychotic disorder with delusions due to known physiological condition: Secondary | ICD-10-CM | POA: Diagnosis not present

## 2017-09-05 DIAGNOSIS — Z79899 Other long term (current) drug therapy: Secondary | ICD-10-CM | POA: Diagnosis not present

## 2017-09-05 DIAGNOSIS — F3162 Bipolar disorder, current episode mixed, moderate: Secondary | ICD-10-CM | POA: Diagnosis not present

## 2017-09-05 LAB — LIPID PANEL
Cholesterol: 148 (ref 0–200)
HDL: 32 — AB (ref 35–70)
LDL CALC: 95
Triglycerides: 105 (ref 40–160)

## 2017-09-05 LAB — VITAMIN B12: VITAMIN B 12: 176

## 2017-09-05 LAB — TSH: TSH: 1.03 (ref 0.41–5.90)

## 2017-09-05 LAB — BASIC METABOLIC PANEL: GLUCOSE: 95

## 2017-09-06 DIAGNOSIS — E539 Vitamin B deficiency, unspecified: Secondary | ICD-10-CM | POA: Diagnosis not present

## 2017-09-06 DIAGNOSIS — F310 Bipolar disorder, current episode hypomanic: Secondary | ICD-10-CM | POA: Diagnosis not present

## 2017-09-06 DIAGNOSIS — E663 Overweight: Secondary | ICD-10-CM | POA: Diagnosis not present

## 2017-09-06 DIAGNOSIS — Z6841 Body Mass Index (BMI) 40.0 and over, adult: Secondary | ICD-10-CM | POA: Diagnosis not present

## 2017-09-07 DIAGNOSIS — E663 Overweight: Secondary | ICD-10-CM | POA: Diagnosis not present

## 2017-09-07 DIAGNOSIS — Z6841 Body Mass Index (BMI) 40.0 and over, adult: Secondary | ICD-10-CM | POA: Diagnosis not present

## 2017-09-07 DIAGNOSIS — E539 Vitamin B deficiency, unspecified: Secondary | ICD-10-CM | POA: Diagnosis not present

## 2017-09-07 DIAGNOSIS — F310 Bipolar disorder, current episode hypomanic: Secondary | ICD-10-CM | POA: Diagnosis not present

## 2017-09-13 ENCOUNTER — Ambulatory Visit (HOSPITAL_COMMUNITY): Payer: Self-pay | Admitting: Psychiatry

## 2017-09-13 DIAGNOSIS — F3162 Bipolar disorder, current episode mixed, moderate: Secondary | ICD-10-CM | POA: Diagnosis not present

## 2017-09-20 DIAGNOSIS — F3162 Bipolar disorder, current episode mixed, moderate: Secondary | ICD-10-CM | POA: Diagnosis not present

## 2017-09-27 DIAGNOSIS — F3162 Bipolar disorder, current episode mixed, moderate: Secondary | ICD-10-CM | POA: Diagnosis not present

## 2017-09-29 ENCOUNTER — Other Ambulatory Visit (HOSPITAL_COMMUNITY): Payer: Self-pay | Admitting: Psychiatry

## 2017-09-29 DIAGNOSIS — F3160 Bipolar disorder, current episode mixed, unspecified: Secondary | ICD-10-CM

## 2017-10-04 ENCOUNTER — Encounter: Payer: Self-pay | Admitting: Physician Assistant

## 2017-10-04 ENCOUNTER — Ambulatory Visit (INDEPENDENT_AMBULATORY_CARE_PROVIDER_SITE_OTHER): Payer: BLUE CROSS/BLUE SHIELD | Admitting: Physician Assistant

## 2017-10-04 VITALS — BP 138/82 | HR 82 | Ht 66.0 in | Wt 249.0 lb

## 2017-10-04 DIAGNOSIS — K521 Toxic gastroenteritis and colitis: Secondary | ICD-10-CM | POA: Diagnosis not present

## 2017-10-04 DIAGNOSIS — R109 Unspecified abdominal pain: Secondary | ICD-10-CM | POA: Diagnosis not present

## 2017-10-04 DIAGNOSIS — R110 Nausea: Secondary | ICD-10-CM

## 2017-10-04 DIAGNOSIS — T50905A Adverse effect of unspecified drugs, medicaments and biological substances, initial encounter: Secondary | ICD-10-CM | POA: Diagnosis not present

## 2017-10-04 MED ORDER — ONDANSETRON HCL 8 MG PO TABS: 8.0000 mg | ORAL_TABLET | Freq: Three times a day (TID) | ORAL | 1 refills | Status: DC | PRN

## 2017-10-04 NOTE — Progress Notes (Deleted)
   Subjective:    Patient ID: Tonya Clark, female    DOB: 06/15/1995, 22 y.o.   MRN: [id number]  HPI bilpolar 1  2 weeks ago. Upper and lower. Stool changes diarrhea.   Review of Systems     Objective:   Physical Exam        Assessment & Plan:

## 2017-10-04 NOTE — Progress Notes (Signed)
   Subjective:    Patient ID: Tonya Clark, female    DOB: Dec 27, 1995, 22 y.o.   MRN: [id number]  HPI 22 year old pleasant appearing female presents with a CC of abdominal pain. This started about 2 weeks ago after she was prescribed metformin. She was prescribed this for weight loss when she was admitted to a hospital several weeks ago. Since starting this medication she has had loose stools, diffuse abdominal pain, nausea, and one episode of vomiting. She denies fever, constipation, blood in the stool, or urinary symptoms such as frequency, urgency, or burning with urination. No fever, chills, body aches.   .. Active Ambulatory Problems    Diagnosis Date Noted  . Bipolar 1 disorder (HCC) 08/21/2013  . Insomnia 08/21/2013  . Ear ringing 02/22/2014  . PAC (premature atrial contraction) 10/09/2015  . PVC (premature ventricular contraction) 10/09/2015  . Moderate right ankle sprain 10/15/2016   Resolved Ambulatory Problems    Diagnosis Date Noted  . GAD (generalized anxiety disorder) 03/30/2013   Past Medical History:  Diagnosis Date  . Arrhythmia   . Jaundice, neonatal   . Kidney stone   . Premature birth       Review of Systems  All other systems reviewed and are negative.      Objective:   Physical Exam  Constitutional: She is oriented to person, place, and time. She appears well-developed and well-nourished.  HENT:  Head: Normocephalic and atraumatic.  Eyes: Conjunctivae and EOM are normal.  Pulmonary/Chest: Effort normal. No respiratory distress.  Abdominal: Soft. Bowel sounds are normal. She exhibits no mass. There is tenderness (mild throughout). There is no guarding.  Neurological: She is alert and oriented to person, place, and time.  Skin: Skin is warm and dry.  Psychiatric: She has a normal mood and affect. Her behavior is normal. Judgment and thought content normal.  Vitals reviewed.     Assessment & Plan:  Marland KitchenMarland KitchenDiagnoses and all orders for this  visit:  Adverse effect of drug, initial encounter  Abdominal cramping  Diarrhea due to drug  Nausea -     ondansetron (ZOFRAN) 8 MG tablet; Take 1 tablet (8 mg total) by mouth every 8 (eight) hours as needed for nausea or vomiting.   Likely medication side effect. No signs of infection. Advised patient to discontinue metformin and to let us know if symptoms do not improve in 1-2 days. zofran as needed for nausea. Call with any worsening symptoms.

## 2017-10-05 ENCOUNTER — Ambulatory Visit: Payer: Self-pay

## 2017-10-05 ENCOUNTER — Other Ambulatory Visit: Payer: Self-pay | Admitting: Family Medicine

## 2017-10-05 DIAGNOSIS — E538 Deficiency of other specified B group vitamins: Secondary | ICD-10-CM

## 2017-10-05 DIAGNOSIS — R7989 Other specified abnormal findings of blood chemistry: Secondary | ICD-10-CM

## 2017-10-05 NOTE — Progress Notes (Signed)
Pt was on nurse visit schedule today for b12 injection. Pt reports when she was inpatient at a psychiatric facility they did her labs and advised she was "dangerously low." She was informed to continue monthly b12 injections after discharge. We do not have record of these labs, so new ones will need to be completed. Pt has been advised and lab order has been placed. Pt does report her last b12 injection was 3-4 weeks ago, and she does have an Rx for this so she has her own vials.

## 2017-10-06 LAB — VITAMIN B12: Vitamin B-12: 350 pg/mL (ref 200–1100)

## 2017-10-06 NOTE — Progress Notes (Signed)
Good afternoon Tonya Clark,  Your B12 level is in a normal range. Some people feel better when it is closer to 1000. Recommend oral B12 500 mcg daily. You do not need injections.

## 2017-10-10 ENCOUNTER — Telehealth: Payer: Self-pay

## 2017-10-10 NOTE — Telephone Encounter (Signed)
Tonya Clark reports she out of her medication she received from her inpatient stay at [facility] in [place]. She needs Trileptal refill/Historical provider. She has been unable to get an appointment with her schedule with a psychiatrist. I tried to schedule her for tomorrow but the spot was taken before I could schedule. Please advise.

## 2017-10-10 NOTE — Telephone Encounter (Signed)
OK to refill for 30 days

## 2017-10-11 ENCOUNTER — Telehealth: Payer: Self-pay | Admitting: *Deleted

## 2017-10-11 ENCOUNTER — Encounter: Payer: Self-pay | Admitting: Family Medicine

## 2017-10-11 MED ORDER — OXCARBAZEPINE 150 MG PO TABS: 150.0000 mg | ORAL_TABLET | Freq: Two times a day (BID) | ORAL | 0 refills | Status: DC

## 2017-10-11 NOTE — Telephone Encounter (Signed)
Patient advised.

## 2017-10-11 NOTE — Telephone Encounter (Signed)
Error

## 2017-10-11 NOTE — Telephone Encounter (Signed)
Medication sent.

## 2017-10-12 DIAGNOSIS — F3170 Bipolar disorder, currently in remission, most recent episode unspecified: Secondary | ICD-10-CM | POA: Diagnosis not present

## 2017-10-12 DIAGNOSIS — F5101 Primary insomnia: Secondary | ICD-10-CM | POA: Diagnosis not present

## 2017-10-13 NOTE — Telephone Encounter (Signed)
OK sounds good to me.

## 2017-10-13 NOTE — Telephone Encounter (Addendum)
Dr. Linford Arnold,  According to [facility] she already has the B12 injections at home. So it just looks like she only needs to do self injections at home monthly?  I will call her and see if she was educated on how to do this.Marland KitchenMarland KitchenHeath Gold, CMA

## 2017-10-13 NOTE — Telephone Encounter (Signed)
OK, please update her chart with med and her recent lab results including cholesterol on the doc attached. Does she need refills or does she need [place] to admin th emed.  If so then add to nurse schedule.    Nani Gasser, MD

## 2017-10-19 ENCOUNTER — Encounter: Payer: Self-pay | Admitting: Family Medicine

## 2017-10-27 ENCOUNTER — Other Ambulatory Visit (HOSPITAL_COMMUNITY): Payer: Self-pay | Admitting: Psychiatry

## 2017-11-03 ENCOUNTER — Ambulatory Visit (INDEPENDENT_AMBULATORY_CARE_PROVIDER_SITE_OTHER): Payer: BLUE CROSS/BLUE SHIELD | Admitting: Family Medicine

## 2017-11-03 ENCOUNTER — Encounter: Payer: Self-pay | Admitting: Family Medicine

## 2017-11-03 VITALS — BP 128/67 | HR 82 | Ht 66.0 in | Wt 251.0 lb

## 2017-11-03 DIAGNOSIS — D510 Vitamin B12 deficiency anemia due to intrinsic factor deficiency: Secondary | ICD-10-CM | POA: Diagnosis not present

## 2017-11-03 DIAGNOSIS — H9193 Unspecified hearing loss, bilateral: Secondary | ICD-10-CM

## 2017-11-03 MED ORDER — CYANOCOBALAMIN 1000 MCG/ML IJ SOLN
1000.0000 ug | Freq: Once | INTRAMUSCULAR | Status: AC
  Administered 2017-11-03: 1000 ug via INTRAMUSCULAR

## 2017-11-03 MED ORDER — PREDNISONE 20 MG PO TABS: 40.0000 mg | ORAL_TABLET | Freq: Every day | ORAL | 0 refills | Status: DC

## 2017-11-03 NOTE — Progress Notes (Signed)
Subjective:    Patient ID: Tonya Clark, female    DOB: [birth year], 22 y.o.   MRN: [id number]  HPI   22 year old female comes in today complaining of being able not hear well for the last several months.  She is just noticed that she is been talking louder people around her been commenting.  There is a lot going on in her room she is having a harder time distinguishing certain noises.  She has been to several loud concerts this past year and says would have ear ringing that lasted for a couple days afterwards.  She denies any ear pain pressure crackling or popping.  No recent upper respiratory symptoms.   Pernicious anemia-when she was at the hospital she was diagnosed with B12 deficiency.  She was given an injection and then encouraged to follow-up monthly for shots at the school [facility] facility but then school ended.  She did have records sent here.   Review of Systems  BP 128/67   Pulse 82   Ht 5\' 6"  (1.676 m)   Wt 251 lb (113.9 kg)   BMI 40.51 kg/m     Allergies  Allergen Reactions  . Lamictal [Lamotrigine] Rash    Past Medical History:  Diagnosis Date  . Arrhythmia   . Jaundice, neonatal   . Kidney stone   . Premature birth    37 weeks    Past Surgical History:  Procedure Laterality Date  . TONSILLECTOMY  2009    Social History   Socioeconomic History  . Marital status: Single    Spouse name: Not on file  . Number of children: Not on file  . Years of education: Not on file  . Highest education level: Not on file  Occupational History  . Occupation: Consulting civil engineer  Social Needs  . Financial resource strain: Not on file  . Food insecurity:    Worry: Not on file    Inability: Not on file  . Transportation needs:    Medical: Not on file    Non-medical: Not on file  Tobacco Use  . Smoking status: Never Smoker  . Smokeless tobacco: Never Used  Substance and Sexual Activity  . Alcohol use: No  . Drug use: No  . Sexual activity: Not on file  Lifestyle   . Physical activity:    Days per week: Not on file    Minutes per session: Not on file  . Stress: Not on file  Relationships  . Social connections:    Talks on phone: Not on file    Gets together: Not on file    Attends religious service: Not on file    Active member of club or organization: Not on file    Attends meetings of clubs or organizations: Not on file    Relationship status: Not on file  . Intimate partner violence:    Fear of current or ex partner: Not on file    Emotionally abused: Not on file    Physically abused: Not on file    Forced sexual activity: Not on file  Other Topics Concern  . Not on file  Social History Narrative   Was home schooled for HS. She is now working at a [employer] 2-3 times per week. Born in [place].     Family History  Problem Relation Age of Onset  . Hypertension Mother   . Allergies Mother   . Anxiety disorder Father   . Hypertension Father  Outpatient Encounter Medications as of 11/03/2017  Medication Sig  . cyanocobalamin (,VITAMIN B-12,) 1000 MCG/ML injection INJECT 1ML INTRAMUSCULARLY EVERY MONTH  . LATUDA 20 MG TABS tablet Take 20 mg by mouth daily.  . metoprolol succinate (TOPROL-XL) 25 MG 24 hr tablet TAKE 1 TABLET (25 MG TOTAL) BY MOUTH DAILY.  Marland Kitchen. ondansetron (ZOFRAN) 8 MG tablet Take 1 tablet (8 mg total) by mouth every 8 (eight) hours as needed for nausea or vomiting.  . OXcarbazepine (TRILEPTAL) 150 MG tablet Take 1 tablet (150 mg total) by mouth 2 (two) times daily.  Marland Kitchen. etonogestrel (NEXPLANON) 68 MG IMPL implant 1 each (68 mg total) by Subdermal route once for 1 dose. Inserted 06/01/17  . predniSONE (DELTASONE) 20 MG tablet Take 2 tablets (40 mg total) by mouth daily with breakfast.  . [DISCONTINUED] escitalopram (LEXAPRO) 20 MG tablet TAKE 1 TABLET (20 MG TOTAL) BY MOUTH AT BEDTIME. (Patient not taking: Reported on 04/13/2017)   No facility-administered encounter medications on file as of 11/03/2017.            Objective:   Physical Exam  Constitutional: She is oriented to person, place, and time. She appears well-developed and well-nourished.  HENT:  Head: Normocephalic and atraumatic.  Right Ear: External ear normal.  Left Ear: External ear normal.  Mouth/Throat: Oropharynx is clear and moist.  TMs and canals are clear bilaterally but she has absent light reflexes on both tympanic membranes and there is some difficulty actually visualizing the ossicle.  No erythema.  No fluid levels.  Eyes: Conjunctivae and EOM are normal.  Neck: Neck supple. No thyromegaly present.  Cardiovascular: Normal rate.  Pulmonary/Chest: Effort normal.  Lymphadenopathy:    She has no cervical adenopathy.  Neurological: She is alert and oriented to person, place, and time.  Skin: Skin is dry. No pallor.  Psychiatric: She has a normal mood and affect. Her behavior is normal.  Vitals reviewed.      Assessment & Plan:  Hearing loss-she was unable to hear the 500 Hz in her left ear at 20 dB so she was able to hear it at 40 dB.  We are going to try a 5-day course of prednisone that she does have absent light reflexes to see if there could be some extra pressure behind the eardrums that are causing this.  Also may be that she is had some hearing damage from loud concerts and noises.  If the prednisone is not effective in relieving some of her symptoms and I would like to refer her to ENT for formal evaluation as well as audiometry.[email or web address]  Pernicious anemia-B12 injection given today.  She has an appointment in July as we can give her next one then and plan to recheck her level at the end of the summer.

## 2017-11-03 NOTE — Patient Instructions (Signed)
Call in 1 week and let me know if the prednisone is not helping.  If not we will refer you to ENT as well as an audiometrist within their office for formal hearing testing and evaluation. Make sure to wear hearing protection if exposed to loud noises such as lawnmowers and concerts.

## 2017-11-03 NOTE — Addendum Note (Signed)
Addended by: Donne AnonBENDER, Brooklynne Pereida L on: 11/03/2017 11:34 AM   Modules accepted: Orders

## 2017-11-11 ENCOUNTER — Encounter: Payer: Self-pay | Admitting: Family Medicine

## 2017-11-11 DIAGNOSIS — H9193 Unspecified hearing loss, bilateral: Secondary | ICD-10-CM

## 2017-11-12 ENCOUNTER — Other Ambulatory Visit: Payer: Self-pay | Admitting: Family Medicine

## 2017-11-12 DIAGNOSIS — I491 Atrial premature depolarization: Secondary | ICD-10-CM

## 2017-11-12 DIAGNOSIS — I493 Ventricular premature depolarization: Secondary | ICD-10-CM

## 2017-11-23 ENCOUNTER — Encounter: Payer: Self-pay | Admitting: Family Medicine

## 2017-11-23 ENCOUNTER — Ambulatory Visit (INDEPENDENT_AMBULATORY_CARE_PROVIDER_SITE_OTHER): Payer: BLUE CROSS/BLUE SHIELD | Admitting: Family Medicine

## 2017-11-23 VITALS — BP 129/84 | HR 62 | Ht 66.0 in | Wt 254.0 lb

## 2017-11-23 DIAGNOSIS — Z6841 Body Mass Index (BMI) 40.0 and over, adult: Secondary | ICD-10-CM | POA: Diagnosis not present

## 2017-11-23 DIAGNOSIS — R635 Abnormal weight gain: Secondary | ICD-10-CM | POA: Diagnosis not present

## 2017-11-23 DIAGNOSIS — R519 Headache, unspecified: Secondary | ICD-10-CM

## 2017-11-23 DIAGNOSIS — R51 Headache: Secondary | ICD-10-CM

## 2017-11-23 MED ORDER — PREDNISONE 10 MG PO TABS: ORAL_TABLET | ORAL | 0 refills | Status: DC

## 2017-11-23 MED ORDER — TOPIRAMATE 25 MG PO TABS: 25.0000 mg | ORAL_TABLET | Freq: Two times a day (BID) | ORAL | 0 refills | Status: DC

## 2017-11-23 NOTE — Progress Notes (Signed)
Subjective:    Patient ID: Tonya Clark, female    DOB: 10-Nov-1995, 22 y.o.   MRN: [id number]  HPI  HA - x 2 wks, she reports that it has been so bad to the point she hasn't been able to do things. the pain is 4/10, throbbing, its at the top, back and sides of her head, denies any spots, visual changes, N/V, sinus pressure, she has been using IBU and excedrin she took excedrin this morning.  Headaches have been daily.  She says is been difficult to fall asleep because of the headaches and she is even woken up in the middle the night a couple times because of headaches.  She actually just had a brain scan in April because of the time she was experiencing some hallucinations related to her psychiatric disorder.  It was negative for any type of tumor etc.she has been sleeping well.  Does occasionally get some light sensitivity.  No sound sensitivity.  She denies seeing any auras.  He has never really had headaches that have persisted this long before.  She is interested in losing weight. We had tried metformin and it caused vomiting.  She has been able to lose some weight and maintain but would like to try to lose more.  She is back down to her high school weight of 254 pounds.  BMI is currently 41.    Review of Systems  BP 129/84   Pulse 62   Ht 5\' 6"  (1.676 m)   Wt 254 lb (115.2 kg)   SpO2 100%   BMI 41.00 kg/m     Allergies  Allergen Reactions  . Lamictal [Lamotrigine] Rash    Past Medical History:  Diagnosis Date  . Arrhythmia   . Jaundice, neonatal   . Kidney stone   . Premature birth    37 weeks    Past Surgical History:  Procedure Laterality Date  . TONSILLECTOMY  2009    Social History   Socioeconomic History  . Marital status: Single    Spouse name: Not on file  . Number of children: Not on file  . Years of education: Not on file  . Highest education level: Not on file  Occupational History  . Occupation: Consulting civil engineer  Social Needs  . Financial resource strain:  Not on file  . Food insecurity:    Worry: Not on file    Inability: Not on file  . Transportation needs:    Medical: Not on file    Non-medical: Not on file  Tobacco Use  . Smoking status: Never Smoker  . Smokeless tobacco: Never Used  Substance and Sexual Activity  . Alcohol use: No  . Drug use: No  . Sexual activity: Not on file  Lifestyle  . Physical activity:    Days per week: Not on file    Minutes per session: Not on file  . Stress: Not on file  Relationships  . Social connections:    Talks on phone: Not on file    Gets together: Not on file    Attends religious service: Not on file    Active member of club or organization: Not on file    Attends meetings of clubs or organizations: Not on file    Relationship status: Not on file  . Intimate partner violence:    Fear of current or ex partner: Not on file    Emotionally abused: Not on file    Physically abused: Not on file  Forced sexual activity: Not on file  Other Topics Concern  . Not on file  Social History Narrative   Was home schooled for HS. She is now working at a [employer] 2-3 times per week. Born in [place].     Family History  Problem Relation Age of Onset  . Hypertension Mother   . Allergies Mother   . Anxiety disorder Father   . Hypertension Father     Outpatient Encounter Medications as of 11/23/2017  Medication Sig  . cyanocobalamin (,VITAMIN B-12,) 1000 MCG/ML injection INJECT 1ML INTRAMUSCULARLY EVERY MONTH  . LATUDA 20 MG TABS tablet Take 20 mg by mouth daily.  . metoprolol succinate (TOPROL-XL) 25 MG 24 hr tablet TAKE 1 TABLET BY MOUTH EVERY DAY  . ondansetron (ZOFRAN) 8 MG tablet Take 1 tablet (8 mg total) by mouth every 8 (eight) hours as needed for nausea or vomiting.  . OXcarbazepine (TRILEPTAL) 150 MG tablet Take 1 tablet (150 mg total) by mouth 2 (two) times daily.  Marland Kitchen. etonogestrel (NEXPLANON) 68 MG IMPL implant 1 each (68 mg total) by Subdermal route once for 1 dose.  Inserted 06/01/17  . predniSONE (DELTASONE) 10 MG tablet Take 8 tablets (80 mg total) by mouth daily with breakfast for 1 day, THEN 6 tablets (60 mg total) daily with breakfast for 1 day, THEN 4 tablets (40 mg total) daily with breakfast for 1 day, THEN 2 tablets (20 mg total) daily with breakfast for 1 day, THEN 1 tablet (10 mg total) daily with breakfast for 1 day.  . topiramate (TOPAMAX) 25 MG tablet Take 1 tablet (25 mg total) by mouth 2 (two) times daily.  . [DISCONTINUED] predniSONE (DELTASONE) 20 MG tablet Take 2 tablets (40 mg total) by mouth daily with breakfast.   No facility-administered encounter medications on file as of 11/23/2017.          Objective:   Physical Exam  Constitutional: She is oriented to person, place, and time. She appears well-developed and well-nourished.  HENT:  Head: Normocephalic and atraumatic.  Right Ear: External ear normal.  Left Ear: External ear normal.  Nose: Nose normal.  Mouth/Throat: Oropharynx is clear and moist.  TMs and canals are clear.   Eyes: Pupils are equal, round, and reactive to light. Conjunctivae and EOM are normal.  Neck: Neck supple. No thyromegaly present.  Cardiovascular: Normal rate, regular rhythm and normal heart sounds.  Pulmonary/Chest: Effort normal and breath sounds normal. She has no wheezes.  Lymphadenopathy:    She has no cervical adenopathy.  Neurological: She is alert and oriented to person, place, and time. She displays normal reflexes. No cranial nerve deficit. Coordination normal.  Skin: Skin is warm and dry.  Psychiatric: She has a normal mood and affect.        Assessment & Plan:  Chronic daily headaches-New onset.  based on her description is more consistent with chronic daily headaches though there is some element that seems like there is a possibility of migraines but will need to keep an eye on the pattern over time.  We really were not able to pinpoint any changes such as new medications, change in  sleep quality, increased stress levels, sinus pain or pressure etc.  I would like to try to break her headache cycle with a prednisone taper during that time want her to avoid all NSAIDs just to make sure there is not also a rebound component to taking over-the-counter medications that could be persisting this headache.  She does occasionally get some tingling in her fingers but says that she thinks that is more from the Seroquel that she takes at bedtime to help her sleep.  Also discussed a trial of topiramate to get her headaches under better control.  Abnormal weight gain/BMI of 41-I would love to help her be successful.  She is currently going to school and working 2 jobs so she is physically active but just does not have a lot of specific time for working out.  Encouraged her to continue to work on [employer].  Addition of topiramate would also help control her appetite somewhat as well.  Monitor for nausea.  May increase the tingling she is experiencing.

## 2017-11-23 NOTE — Patient Instructions (Signed)
Stop all over-the-counter pain medications including Tylenol, Aleve, Excedrin etc. while taking the prednisone for 5 days.

## 2017-11-25 ENCOUNTER — Encounter: Payer: Self-pay | Admitting: Physician Assistant

## 2017-11-25 ENCOUNTER — Encounter: Payer: Self-pay | Admitting: Family Medicine

## 2017-11-25 ENCOUNTER — Ambulatory Visit (INDEPENDENT_AMBULATORY_CARE_PROVIDER_SITE_OTHER): Payer: BLUE CROSS/BLUE SHIELD | Admitting: Physician Assistant

## 2017-11-25 VITALS — BP 118/84 | HR 98 | Temp 98.3°F | Wt 255.0 lb

## 2017-11-25 DIAGNOSIS — G4452 New daily persistent headache (NDPH): Secondary | ICD-10-CM

## 2017-11-25 MED ORDER — BACLOFEN 10 MG PO TABS: 10.0000 mg | ORAL_TABLET | Freq: Three times a day (TID) | ORAL | 0 refills | Status: DC | PRN

## 2017-11-25 MED ORDER — SUMATRIPTAN SUCCINATE 6 MG/0.5ML ~~LOC~~ SOLN
6.0000 mg | Freq: Once | SUBCUTANEOUS | Status: AC
  Administered 2017-11-25: 6 mg via SUBCUTANEOUS

## 2017-11-25 NOTE — Progress Notes (Signed)
HPI:                                                                Tonya EdwardsMichaela Clark is a 22 y.o. female who [facility] today for headache  Persistent headache waxing and waning for 2 weeks. Headache is bilateral in the parietal area,  throbbing, worse with physical activity, constant. Associated with photophobia, eye pain and dental pain. Reports she was using Ibuprofen and Excedrin daily for nearly 2 weeks.  No history of primary headaches, but she did have a CT Head w/o contrast in April for hallucinations, which was negative. Denies constitutional symptoms, dizziness, vomiting, gait disturbance  She was evaluated by her PCP 2 days ago, who felt there was medication overuse headache. OTC pain relievers were discontinued and she was started on a Prednisone taper. She was also given Topiramate for prophylaxis, but has not started this. Reports Prednisone worsened her symptoms    Past Medical History:  Diagnosis Date  . Arrhythmia   . Jaundice, neonatal   . Kidney stone   . Premature birth    37 weeks   Past Surgical History:  Procedure Laterality Date  . TONSILLECTOMY  2009   Social History   Tobacco Use  . Smoking status: Never Smoker  . Smokeless tobacco: Never Used  Substance Use Topics  . Alcohol use: No   family history includes Allergies in her mother; Anxiety disorder in her father; Hypertension in her father and mother.    ROS: negative except as noted in the HPI  Medications: Current Outpatient Medications  Medication Sig Dispense Refill  . cyanocobalamin (,VITAMIN B-12,) 1000 MCG/ML injection INJECT 1ML INTRAMUSCULARLY EVERY MONTH  0  . LATUDA 20 MG TABS tablet Take 20 mg by mouth daily.  0  . metoprolol succinate (TOPROL-XL) 25 MG 24 hr tablet TAKE 1 TABLET BY MOUTH EVERY DAY 90 tablet 1  . OXcarbazepine (TRILEPTAL) 150 MG tablet Take 1 tablet (150 mg total) by mouth 2 (two) times daily. 60 tablet 0   . baclofen (LIORESAL) 10 MG tablet Take 1 tablet (10 mg total) by mouth every 8 (eight) hours as needed for muscle spasms. 30 each 0  . etonogestrel (NEXPLANON) 68 MG IMPL implant 1 each (68 mg total) by Subdermal route once for 1 dose. Inserted 06/01/17 1 each 0  . topiramate (TOPAMAX) 25 MG tablet Take 1 tablet (25 mg total) by mouth 2 (two) times daily. (Patient not taking: Reported on 11/25/2017) 60 tablet 0   No current facility-administered medications for this visit.    Allergies  Allergen Reactions  . Lamictal [Lamotrigine] Rash       Objective:  BP 118/84   Pulse 98   Wt 255 lb (115.7 kg)   SpO2 97%   BMI 41.16 kg/m  Gen: well-groomed, not ill-appearing, no acute distress, obese female HEENT: head normocephalic, atraumatic; conjunctiva and cornea clear, wearing glasses, no papilledema, oropharynx clear, moist mucus membranes; neck supple, no meningeal signs Pulm: Normal work of breathing, normal phonation, clear to auscultation bilaterally CV: Normal rate, regular rhythm, s1 and s2 distinct, no murmurs, clicks or rubs Neuro:  cranial nerves II-XII intact, no nystagmus, normal finger-to-nose, normal heel-to-shin, negative pronator drift, normal rapid alternating movements,  DTR's intact, normal tone, no tremor MSK: strength 5/5 and symmetric in bilateral upper and lower extremities, normal gait and station, negative Romberg Mental Status: alert and oriented x 3, speech articulate, and thought processes clear and goal-directed    No results found for this or any previous visit (from the past 72 hour(s)). No results found.    Assessment and Plan: 22 y.o. female with   New daily persistent headache - Plan: SUMAtriptan (IMITREX) injection 6 mg, baclofen (LIORESAL) 10 MG tablet - reassuring neuro exam, personally reviewed CT report (patient had a copy on her phone through MyChart), which was negative - at this point, patient's symptoms are most consistent with analgesic  rebound headache. Some features of her headache are migrainous. Avoiding Toradol due to NSAID overuse, avoiding Reglan due to history of psychosis and current mood stabilizer use. She did not respond to steroids. She was given single dose of IM Sumatriptan 6 mg and observed for 30 minutes in the office. She experienced mild throat burning, otherwise no adverse reaction. Did not relieve headache - counseled to continue avoiding OTC analgesics. Stopping Prednisone.  - Baclofen 10 mg Q8H prn - work note provided for today and tomorrow  Patient education and anticipatory guidance given Patient agrees with treatment plan Follow-up as needed if symptoms worsen or fail to improve  Levonne Hubert PA-C

## 2017-11-25 NOTE — Patient Instructions (Addendum)
- continue to avoid all OTC pain relievers - stop Prednisone - try Baclofen 1 tab as needed for headache (may cause drowsiness)   Analgesic Rebound Headache An analgesic rebound headache, sometimes called a medication overuse headache, is a headache that comes after pain medicine (analgesic) taken to treat the original (primary) headache has worn off. Any type of primary headache can return as a rebound headache if a person regularly takes analgesics more than three times a week to treat it. The types of primary headaches that are commonly associated with rebound headaches include:  Migraines.  Headaches that arise from tense muscles in the head and neck area (tension headaches).  Headaches that develop and happen again (recur) on one side of the head and around the eye (cluster headaches).  If rebound headaches continue, they become chronic daily headaches. What are the causes? This condition may be caused by frequent use of:  Over-the-counter medicines such as aspirin, ibuprofen, and acetaminophen.  Sinus relief medicines and other medicines that contain caffeine.  Narcotic pain medicines such as codeine and oxycodone.  What are the signs or symptoms? The symptoms of a rebound headache are the same as the symptoms of the original headache. Some of the symptoms of specific types of headaches include: Migraine headache  Pulsing or throbbing pain on one or both sides of the head.  Severe pain that interferes with daily activities.  Pain that is worsened by physical activity.  Nausea, vomiting, or both.  Pain with exposure to bright light, loud noises, or strong smells.  General sensitivity to bright light, loud noises, or strong smells.  Visual changes.  Numbness of one or both arms. Tension headache  Pressure around the head.  Dull, aching head pain.  Pain felt over the front and sides of the head.  Tenderness in the muscles of the head, neck, and  shoulders. Cluster headache  Severe pain that begins in or around one eye or temple.  Redness and tearing in the eye on the same side as the pain.  Droopy or swollen eyelid.  One-sided head pain.  Nausea.  Runny nose.  Sweaty, pale facial skin.  Restlessness. How is this diagnosed? This condition is diagnosed by:  Reviewing your medical history. This includes the nature of your primary headaches.  Reviewing the types of pain medicines that you have been using to treat your headaches and how often you take them.  How is this treated? This condition may be treated or managed by:  Discontinuing frequent use of the analgesic medicine. Doing this may worsen your headaches at first, but the pain should eventually become more manageable, less frequent, and less severe.  Seeing a headache specialist. He or she may be able to help you manage your headaches and help make sure there is not another cause of the headaches.  Using methods of stress relief, such as acupuncture, counseling, biofeedback, and massage. Talk with your health care provider about which methods might be good for you.  Follow these instructions at home:  Take over-the-counter and prescription medicines only as told by your health care provider.  Stop the repeated use of pain medicine as told by your health care provider. Stopping can be difficult. Carefully follow instructions from your health care provider.  Avoid triggers that are known to cause your primary headaches.  Keep all follow-up visits as told by your health care provider. This is important. Contact a health care provider if:  You continue to experience headaches after following treatments  that your health care provider recommended. Get help right away if:  You develop new headache pain.  You develop headache pain that is different than what you have experienced in the past.  You develop numbness or tingling in your arms or legs.  You  develop changes in your speech or vision. This information is not intended to replace advice given to you by your health care provider. Make sure you discuss any questions you have with your health care provider. Document Released: 07/31/2003 Document Revised: 11/28/2015 Document Reviewed: 10/13/2015 Elsevier Interactive Patient Education  [employer]2018 Elsevier Inc.

## 2017-11-25 NOTE — Telephone Encounter (Signed)
Called pt and left msg advising her to make appt today to be re-evaluated. Let pt know that Dr Linford ArnoldMetheney had openings today, advised her to call office back ASAP and get scheduled

## 2017-11-26 ENCOUNTER — Emergency Department
Admission: EM | Admit: 2017-11-26 | Discharge: 2017-11-26 | Disposition: A | Discharge status: Home / Self Care | Payer: BLUE CROSS/BLUE SHIELD | Source: Home / Self Care

## 2017-11-28 ENCOUNTER — Ambulatory Visit (INDEPENDENT_AMBULATORY_CARE_PROVIDER_SITE_OTHER): Payer: BLUE CROSS/BLUE SHIELD

## 2017-11-28 ENCOUNTER — Encounter: Payer: Self-pay | Admitting: Family Medicine

## 2017-11-28 ENCOUNTER — Ambulatory Visit: Payer: BLUE CROSS/BLUE SHIELD | Admitting: Family Medicine

## 2017-11-28 DIAGNOSIS — R519 Headache, unspecified: Secondary | ICD-10-CM

## 2017-11-28 DIAGNOSIS — H538 Other visual disturbances: Secondary | ICD-10-CM | POA: Diagnosis not present

## 2017-11-28 DIAGNOSIS — R51 Headache: Secondary | ICD-10-CM | POA: Diagnosis not present

## 2017-11-28 NOTE — Patient Instructions (Addendum)
Thank you for coming in today. I will get MRI report to you ASAP.  If still unknown after MRI we likely will try a different medicine and also consider neurology referral.   Go to the emergency room if your headache becomes excruciating or you have weakness or numbness or uncontrolled vomiting.   Arrive at 315 at radiology downstairs for MRI   Idiopathic Intracranial Hypertension Idiopathic intracranial hypertension (IIH) is a condition that increases pressure around the brain. The fluid that surrounds the brain and spinal cord (cerebrospinal fluid, CSF) increases and causes the pressure. Idiopathic means that the cause of this condition is not known. IIH affects the brain and spinal cord (is a neurological disorder). If this condition is not treated, it can cause vision loss or blindness. What increases the risk? You are more likely to develop this condition if:  You are severely overweight (obese).  You are a woman who has not gone through menopause.  You take certain medicines, such as birth control or steroids.  What are the signs or symptoms? Symptoms of IIH include:  Headaches. This is the most common symptom.  Pain in the shoulders or neck.  Nausea and vomiting.  A "rushing water" or pulsing sound within the ears (pulsatile tinnitus).  Double vision.  Blurred vision.  Brief episodes of complete vision loss.  How is this diagnosed? This condition may be diagnosed based on:  Your symptoms.  Your medical history.  CT scan of the brain.  MRI of the brain.  Magnetic resonance venogram (MRV) to check veins in the brain.  Diagnostic lumbar puncture. This is a procedure to remove and examine a sample of cerebrospinal fluid. This procedure can determine whether too much fluid may be causing IIH.  A thorough eye exam to check for swelling or nerve damage in the eyes.  How is this treated? Treatment for this condition depends on your symptoms. The goal of treatment  is to decrease the pressure around your brain. Common treatments include:  Medicines to decrease the production of spinal fluid and lower the pressure within your skull.  Medicines to prevent or treat headaches.  Surgery to place drains (shunts) in your brain to remove excess fluid.  Lumbar puncture to remove excess cerebrospinal fluid.  Follow these instructions at home:  If you are overweight or obese, work with your health care provider to lose weight.  Take over-the-counter and prescription medicines only as told by your health care provider.  Do not drive or use heavy machinery while taking medicines that can make you sleepy.  Keep all follow-up visits as told by your health care provider. This is important. Contact a health care provider if:  You have changes in your vision, such as: ? Double vision. ? Not being able to see colors (color vision). Get help right away if:  You have any of the following symptoms and they get worse or do not get better. ? Headaches. ? Nausea. ? Vomiting. ? Vision changes or difficulty seeing. Summary  Idiopathic intracranial hypertension (IIH) is a condition that increases pressure around the brain. The cause is not known (is idiopathic).  The most common symptom of IIH is headaches.  Treatment may include medicines or surgery to relieve the pressure on your brain. This information is not intended to replace advice given to you by your health care provider. Make sure you discuss any questions you have with your health care provider. Document Released: 07/19/2001 Document Revised: 03/31/2016 Document Reviewed: 03/31/2016 Elsevier Interactive  Patient Education  2017 Elsevier Inc.  

## 2017-11-28 NOTE — Progress Notes (Signed)
Tonya Clark is a 22 y.o. female who presents to [facility]: [facility] today for headache.  Initial notes a history of severe headache for the last 3 weeks or so.  She is been seen by her primary care provider and another provider in my clinic Tonya Clark on July 3 and July 5.  She notes persistent bilateral headache.  She notes the headache is either posterior or temporal.  She describes a headache is severe and squeezing or pounding.  She does note some mild blurry vision associate with a headache.  She has a headache waxes and wanes during the course of the day and is typically worse in the evening.  She cannot think of any exacerbating or alleviating factors however.  She has had trials of ibuprofen Imitrex and Topamax which have not helped very much at all.  She denies any new medications or activity or trauma.  She denies any weakness or numbness or loss of function.  No fevers or chills vomiting or diarrhea.   ROS as above:  Exam:  BP 118/84   Wt 255 lb (115.7 kg)   BMI 41.16 kg/m  Gen: Well NAD HEENT: EOMI,  MMM funduscopic exam does not show severe bulging bilaterally. Lungs: Normal work of breathing. CTABL Heart: RRR no MRG Abd: NABS, Soft. Nondistended, Nontender Exts: Brisk capillary refill, warm and well perfused.  Neuro alert and oriented normal coordination balance and gait.  Normal speech and thought process.   Lab and Radiology Results CT scan report from [facility] April 2019 reviewed normal  Seamless piercing from left nostril removed without significant bleeding.   Assessment and Plan: 22 y.o. female with  Severe new onset daily intractable headache concerning for pseudotumor cerebri versus mass-effect or other serious intracranial etiology.  Fortunately patient does not have severe neurologic findings at this time.  Plan for MRI of the  brain to evaluate for the cause of pain.  If unremarkable next step would be likely referral to neurology and consider Aimovig. Will contact patient after MRI.   Orders Placed This Encounter  Procedures  . MR Brain Wo Contrast    Standing Status:   Future    Standing Expiration Date:   01/30/2019    Order Specific Question:   ** REASON FOR EXAM (FREE TEXT)    Answer:   Severe headache for 2-3 weeks with blurry vision. Normal CT scan head April 2019    Order Specific Question:   What is the patient's sedation requirement?    Answer:   No Sedation    Order Specific Question:   Does the patient have a pacemaker or implanted devices?    Answer:   No    Order Specific Question:   Preferred imaging location?    Answer:   [facility] (table limit-350lbs)    Order Specific Question:   Radiology Contrast Protocol - do NOT remove file path    Answer:   [email or web address]   No orders of the defined types were placed in this encounter.    Historical information moved to improve visibility of documentation.  Past Medical History:  Diagnosis Date  . Arrhythmia   . Bipolar 1 disorder (HCC) 08/21/2013  . Jaundice, neonatal   . Kidney stone   . Premature birth    37 weeks   Past Surgical History:  Procedure Laterality Date  . TONSILLECTOMY  2009   Social History   Tobacco Use  .  Smoking status: Never Smoker  . Smokeless tobacco: Never Used  Substance Use Topics  . Alcohol use: No   family history includes Allergies in her mother; Anxiety disorder in her father; Hypertension in her father and mother.  Medications: Current Outpatient Medications  Medication Sig Dispense Refill  . baclofen (LIORESAL) 10 MG tablet Take 1 tablet (10 mg total) by mouth every 8 (eight) hours as needed for muscle spasms. 30 each 0  . cyanocobalamin (,VITAMIN B-12,) 1000 MCG/ML injection INJECT 1ML INTRAMUSCULARLY EVERY MONTH  0  . LATUDA 20 MG TABS tablet Take 20 mg by  mouth daily.  0  . metoprolol succinate (TOPROL-XL) 25 MG 24 hr tablet TAKE 1 TABLET BY MOUTH EVERY DAY 90 tablet 1  . OXcarbazepine (TRILEPTAL) 150 MG tablet Take 1 tablet (150 mg total) by mouth 2 (two) times daily. 60 tablet 0  . topiramate (TOPAMAX) 25 MG tablet Take 1 tablet (25 mg total) by mouth 2 (two) times daily. 60 tablet 0  . etonogestrel (NEXPLANON) 68 MG IMPL implant 1 each (68 mg total) by Subdermal route once for 1 dose. Inserted 06/01/17 1 each 0   No current facility-administered medications for this visit.    Allergies  Allergen Reactions  . Lamictal [Lamotrigine] Rash     Discussed warning signs or symptoms. Please see discharge instructions. Patient expresses understanding.

## 2017-11-29 ENCOUNTER — Telehealth: Payer: Self-pay | Admitting: Family Medicine

## 2017-11-29 DIAGNOSIS — R51 Headache: Principal | ICD-10-CM

## 2017-11-29 DIAGNOSIS — R519 Headache, unspecified: Secondary | ICD-10-CM

## 2017-11-29 NOTE — Telephone Encounter (Signed)
Left VM for Pt to return clinic call regarding results, callback information provided. 

## 2017-11-29 NOTE — Telephone Encounter (Signed)
Brain MRI normal.  Referral to neurology ordered.

## 2017-11-30 ENCOUNTER — Ambulatory Visit: Payer: Self-pay | Admitting: Family Medicine

## 2017-11-30 DIAGNOSIS — F3162 Bipolar disorder, current episode mixed, moderate: Secondary | ICD-10-CM | POA: Diagnosis not present

## 2017-12-01 ENCOUNTER — Encounter: Payer: Self-pay | Admitting: Family Medicine

## 2017-12-07 ENCOUNTER — Encounter: Payer: Self-pay | Admitting: Family Medicine

## 2017-12-07 ENCOUNTER — Ambulatory Visit (INDEPENDENT_AMBULATORY_CARE_PROVIDER_SITE_OTHER): Payer: BLUE CROSS/BLUE SHIELD | Admitting: Family Medicine

## 2017-12-07 VITALS — BP 118/64 | HR 86 | Ht 66.0 in | Wt 252.0 lb

## 2017-12-07 DIAGNOSIS — D510 Vitamin B12 deficiency anemia due to intrinsic factor deficiency: Secondary | ICD-10-CM | POA: Diagnosis not present

## 2017-12-07 DIAGNOSIS — G4452 New daily persistent headache (NDPH): Secondary | ICD-10-CM

## 2017-12-07 DIAGNOSIS — F319 Bipolar disorder, unspecified: Secondary | ICD-10-CM | POA: Diagnosis not present

## 2017-12-07 MED ORDER — CYANOCOBALAMIN 1000 MCG/ML IJ SOLN
1000.0000 ug | Freq: Once | INTRAMUSCULAR | Status: AC
  Administered 2017-12-07: 1000 ug via INTRAMUSCULAR

## 2017-12-07 MED ORDER — TOPIRAMATE 25 MG PO TABS: 25.0000 mg | ORAL_TABLET | Freq: Two times a day (BID) | ORAL | 1 refills | Status: DC

## 2017-12-07 NOTE — Progress Notes (Signed)
Pt here for B12 injection. Injection was given in LD, she tolerated well. Tonya Clark.[name], [name], CMA

## 2017-12-07 NOTE — Progress Notes (Signed)
Subjective:    Patient ID: Tonya Clark, female    DOB: [birth year], 22 y.o.   MRN: [id number]  HPI 22 year old female has been seen in our office in urgent care a total of 4 times over the last 3 weeks for chronic daily headaches.  When I initially evaluated her I felt like it was most consistent with rebound phenomenon from overuse of over-the-counter medications which she had been taking on a daily and sometimes multiple daily dosing regimen.  Unfortunately she did not respond to a prednisone taper.  I also started her on Topamax for prophylaxis.  She did have an MRI on July 8 with the following results:  Study Result   CLINICAL DATA:  Severe headache for 3 weeks, blurry vision.  EXAM: MRI HEAD WITHOUT CONTRAST  TECHNIQUE: Multiplanar, multiecho pulse sequences of the brain and surrounding structures were obtained without intravenous contrast.  COMPARISON:  None.  FINDINGS: INTRACRANIAL CONTENTS: No reduced diffusion to suggest acute ischemia or hyperacute demyelination. No susceptibility artifact to suggest hemorrhage. The ventricles and sulci are normal for patient's age. No suspicious parenchymal signal, masses, mass effect. No abnormal extra-axial fluid collections. No extra-axial masses.  VASCULAR: Normal major intracranial vascular flow voids present at skull base.  SKULL AND UPPER CERVICAL SPINE: No abnormal sellar expansion. No suspicious calvarial bone marrow signal. Craniocervical junction maintained.  SINUSES/ORBITS: The mastoid air-cells and included paranasal sinuses are well-aerated.The included ocular globes and orbital contents are non-suspicious.  OTHER: None.  IMPRESSION: Normal noncontrast MRI head.   Electronically Signed   By: Awilda Metro M.D.   On: 11/28/2017 15:50    Essentially the MRI was normal which near the differential and rule out things like mass-effect and idiopathic intracranial hypertension.  A neurology  referral has been placed but the first appointment is in September.  She is here today to discuss options for treatment until she is able to get in.  Bipolar 1 disorder-she is now on Seroquel at bedtime 25 mg and says it does cause some tingling in her fingertips but has been helpful.  And they recently added Wellbutrin.  Pernicious anemia-she is due for her B12 injection today.  Review of Systems  BP 118/64   Pulse 86   Ht 5\' 6"  (1.676 m)   Wt 252 lb (114.3 kg)   SpO2 100%   BMI 40.67 kg/m     Allergies  Allergen Reactions  . Lamictal [Lamotrigine] Rash    Past Medical History:  Diagnosis Date  . Arrhythmia   . Bipolar 1 disorder (HCC) 08/21/2013  . Jaundice, neonatal   . Kidney stone   . Premature birth    37 weeks    Past Surgical History:  Procedure Laterality Date  . TONSILLECTOMY  2009    Social History   Socioeconomic History  . Marital status: Single    Spouse name: Not on file  . Number of children: Not on file  . Years of education: Not on file  . Highest education level: Not on file  Occupational History  . Occupation: Consulting civil engineer  Social Needs  . Financial resource strain: Not on file  . Food insecurity:    Worry: Not on file    Inability: Not on file  . Transportation needs:    Medical: Not on file    Non-medical: Not on file  Tobacco Use  . Smoking status: Never Smoker  . Smokeless tobacco: Never Used  Substance and Sexual Activity  . Alcohol use: No  .  Drug use: No  . Sexual activity: Not on file  Lifestyle  . Physical activity:    Days per week: Not on file    Minutes per session: Not on file  . Stress: Not on file  Relationships  . Social connections:    Talks on phone: Not on file    Gets together: Not on file    Attends religious service: Not on file    Active member of club or organization: Not on file    Attends meetings of clubs or organizations: Not on file    Relationship status: Not on file  . Intimate partner violence:     Fear of current or ex partner: Not on file    Emotionally abused: Not on file    Physically abused: Not on file    Forced sexual activity: Not on file  Other Topics Concern  . Not on file  Social History Narrative   Was home schooled for HS. She is now working at a [employer] day[employer] 2-3 times per week. [place]     Family History  Problem Relation Age of Onset  . Hypertension Mother   . Allergies Mother   . Anxiety disorder Father   . Hypertension Father     Outpatient Encounter Medications as of 12/07/2017  Medication Sig  . baclofen (LIORESAL) 10 MG tablet Take 1 tablet (10 mg total) by mouth every 8 (eight) hours as needed for muscle spasms.  Marland Kitchen. buPROPion (WELLBUTRIN SR) 150 MG 12 hr tablet Take 150 mg by mouth 2 (two) times daily.  Marland Kitchen. LATUDA 20 MG TABS tablet Take 20 mg by mouth daily.  . metoprolol succinate (TOPROL-XL) 25 MG 24 hr tablet TAKE 1 TABLET BY MOUTH EVERY DAY  . OXcarbazepine (TRILEPTAL) 150 MG tablet Take 1 tablet (150 mg total) by mouth 2 (two) times daily.  . QUEtiapine (SEROQUEL) 25 MG tablet Take 25-50 mg by mouth at bedtime.  . topiramate (TOPAMAX) 25 MG tablet Take 1 tablet (25 mg total) by mouth 2 (two) times daily.  . [DISCONTINUED] topiramate (TOPAMAX) 25 MG tablet Take 1 tablet (25 mg total) by mouth 2 (two) times daily.  Marland Kitchen. etonogestrel (NEXPLANON) 68 MG IMPL implant 1 each (68 mg total) by Subdermal route once for 1 dose. Inserted 06/01/17  . [DISCONTINUED] cyanocobalamin (,VITAMIN B-12,) 1000 MCG/ML injection INJECT 1ML INTRAMUSCULARLY EVERY MONTH  . [EXPIRED] cyanocobalamin ((VITAMIN B-12)) injection 1,000 mcg    No facility-administered encounter medications on file as of 12/07/2017.          Objective:   Physical Exam  Constitutional: She is oriented to person, place, and time. She appears well-developed and well-nourished.  HENT:  Head: Normocephalic and atraumatic.  Right Ear: External ear normal.  Left Ear: External ear normal.   Nose: Nose normal.  Mouth/Throat: Oropharynx is clear and moist.  TMs and canals are clear.   Eyes: Pupils are equal, round, and reactive to light. Conjunctivae and EOM are normal.  Neck: Neck supple. No thyromegaly present.  Cardiovascular: Normal rate, regular rhythm and normal heart sounds.  Pulmonary/Chest: Effort normal and breath sounds normal. She has no wheezes.  Lymphadenopathy:    She has no cervical adenopathy.  Neurological: She is alert and oriented to person, place, and time.  Skin: Skin is warm and dry.  Psychiatric: She has a normal mood and affect.        Assessment & Plan:  Daily persistant HA -she is doing much better overall.  And that her headaches have almost completely resolved.  We will continue the Topamax for now as it is difficult to say if what was causing them has seemed to resolve on its own or if the Topamax started to work and is controlling her headaches.  She is going back to school in about 3 weeks so I will see her at her winter break in December.  bipolar 1 disorder-continue to follow with psychiatry.  Pernicious anemia-B12 injection given today.  She will continue those at school this fall.  We can plan to recheck her B12 level when she comes back in December.

## 2017-12-12 DIAGNOSIS — H93293 Other abnormal auditory perceptions, bilateral: Secondary | ICD-10-CM | POA: Diagnosis not present

## 2017-12-28 DIAGNOSIS — F3162 Bipolar disorder, current episode mixed, moderate: Secondary | ICD-10-CM | POA: Diagnosis not present

## 2018-01-13 DIAGNOSIS — F3162 Bipolar disorder, current episode mixed, moderate: Secondary | ICD-10-CM | POA: Diagnosis not present

## 2018-01-30 IMAGING — DX DG FOOT COMPLETE 3+V*R*
3 series · 3 of 3 positions shown · non-contrast
Comparison: None.

CLINICAL DATA: Right foot pain following fall several weeks ago,
initial encounter

EXAM:
RIGHT FOOT COMPLETE - 3+ VIEW

[foot ap]
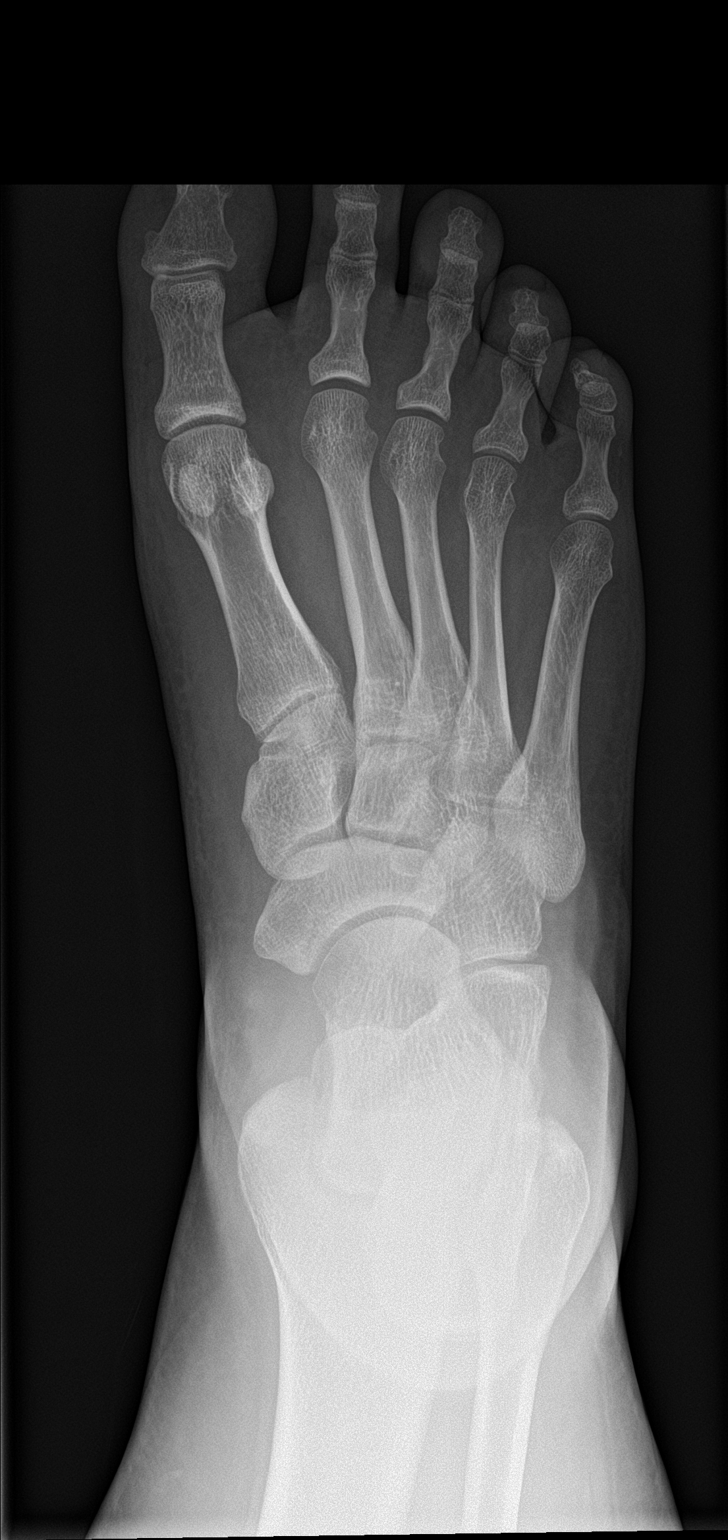

[foot obl]
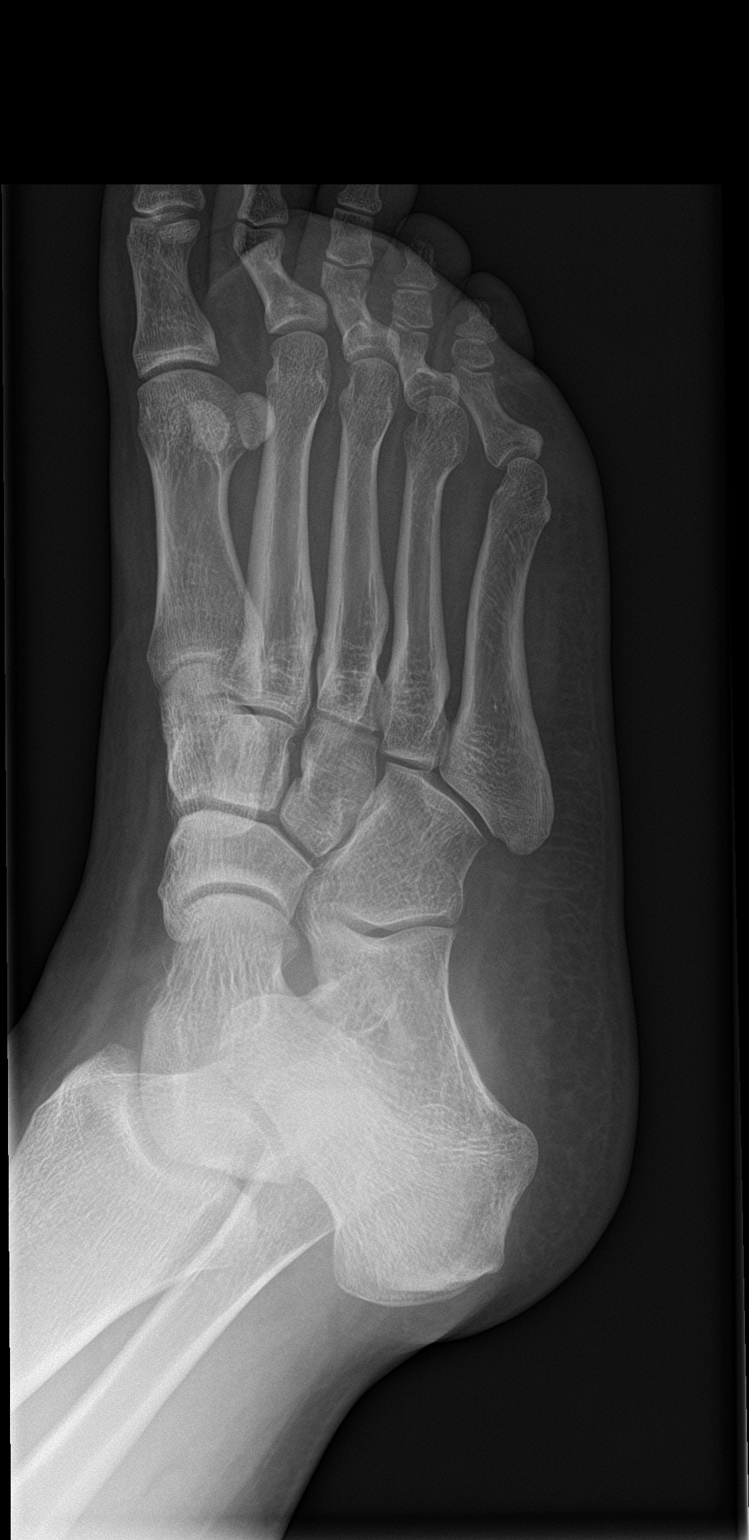

[foot lat]
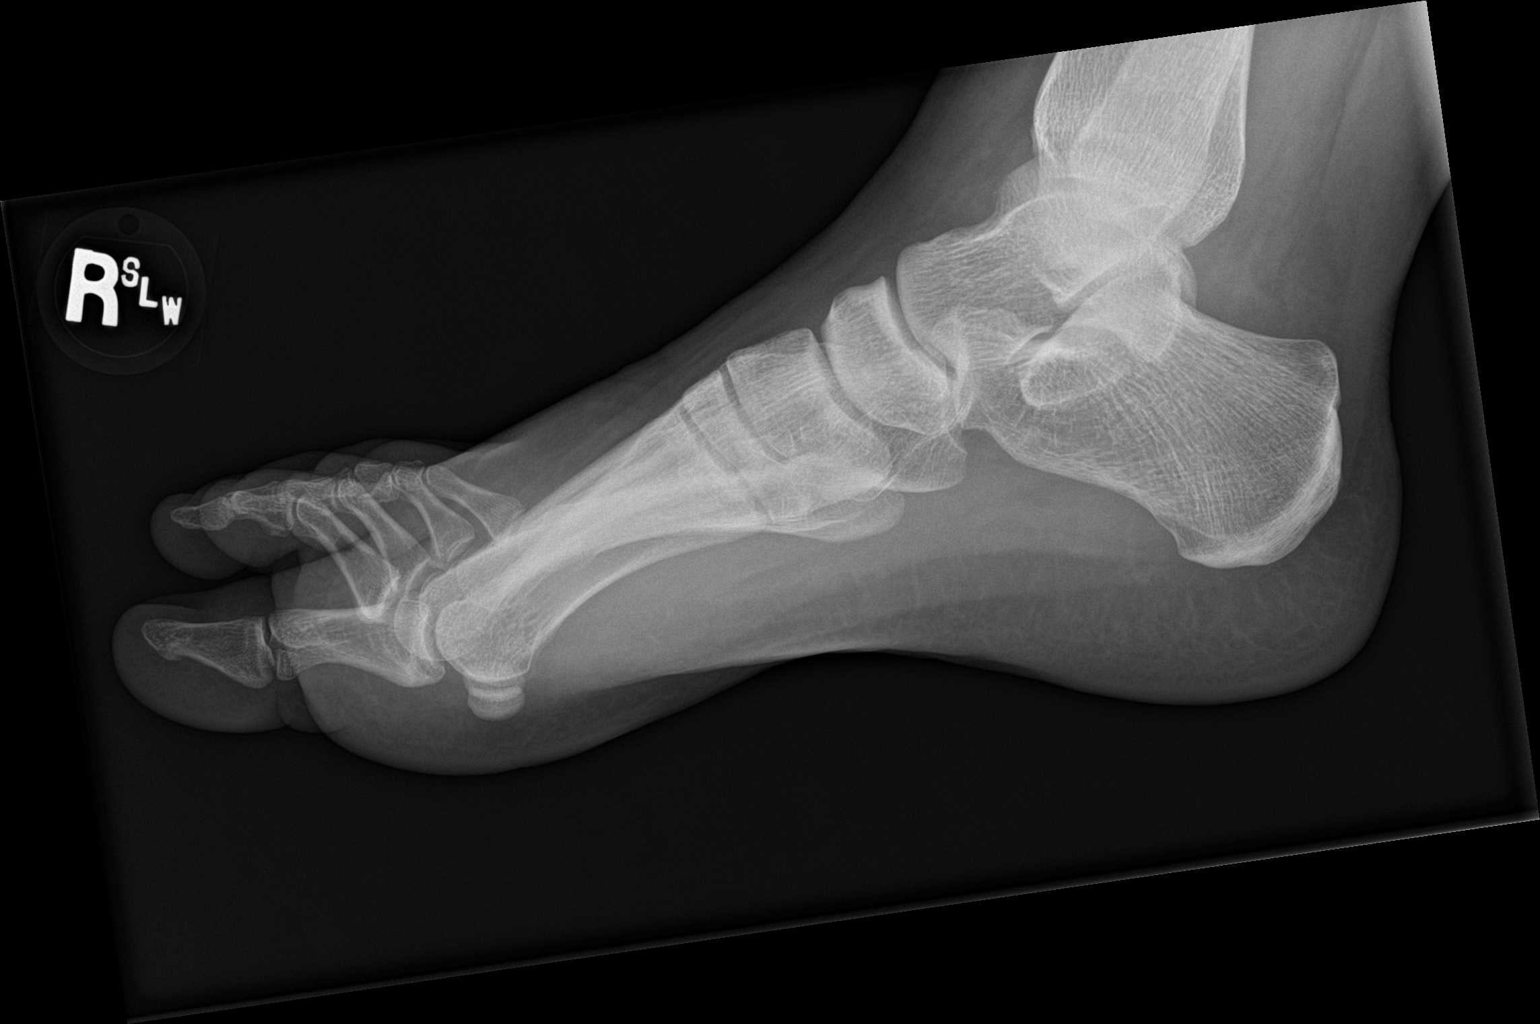

[3 of 3 positions shown; findings below may reference images not displayed]

FINDINGS: There is no evidence of fracture or dislocation. There is no
evidence of arthropathy or other focal bone abnormality. Soft
tissues are unremarkable.
IMPRESSION: No acute abnormality noted.

## 2018-01-31 ENCOUNTER — Ambulatory Visit: Payer: Self-pay | Admitting: Neurology

## 2018-02-01 DIAGNOSIS — F3162 Bipolar disorder, current episode mixed, moderate: Secondary | ICD-10-CM | POA: Diagnosis not present

## 2018-02-15 DIAGNOSIS — F3162 Bipolar disorder, current episode mixed, moderate: Secondary | ICD-10-CM | POA: Diagnosis not present

## 2018-03-01 DIAGNOSIS — F3162 Bipolar disorder, current episode mixed, moderate: Secondary | ICD-10-CM | POA: Diagnosis not present

## 2018-03-08 DIAGNOSIS — F3162 Bipolar disorder, current episode mixed, moderate: Secondary | ICD-10-CM | POA: Diagnosis not present

## 2018-03-15 DIAGNOSIS — F3162 Bipolar disorder, current episode mixed, moderate: Secondary | ICD-10-CM | POA: Diagnosis not present

## 2018-03-23 DIAGNOSIS — N132 Hydronephrosis with renal and ureteral calculous obstruction: Secondary | ICD-10-CM | POA: Diagnosis not present

## 2018-03-23 DIAGNOSIS — E860 Dehydration: Secondary | ICD-10-CM | POA: Diagnosis not present

## 2018-03-23 DIAGNOSIS — R109 Unspecified abdominal pain: Secondary | ICD-10-CM | POA: Diagnosis not present

## 2018-03-23 DIAGNOSIS — R1110 Vomiting, unspecified: Secondary | ICD-10-CM | POA: Diagnosis not present

## 2018-03-28 DIAGNOSIS — R109 Unspecified abdominal pain: Secondary | ICD-10-CM | POA: Diagnosis not present

## 2018-03-29 DIAGNOSIS — F3162 Bipolar disorder, current episode mixed, moderate: Secondary | ICD-10-CM | POA: Diagnosis not present

## 2018-04-05 DIAGNOSIS — F3162 Bipolar disorder, current episode mixed, moderate: Secondary | ICD-10-CM | POA: Diagnosis not present

## 2018-04-08 DIAGNOSIS — J029 Acute pharyngitis, unspecified: Secondary | ICD-10-CM | POA: Diagnosis not present

## 2018-04-08 DIAGNOSIS — Z20818 Contact with and (suspected) exposure to other bacterial communicable diseases: Secondary | ICD-10-CM | POA: Diagnosis not present

## 2018-04-26 DIAGNOSIS — F3162 Bipolar disorder, current episode mixed, moderate: Secondary | ICD-10-CM | POA: Diagnosis not present

## 2018-05-03 DIAGNOSIS — F3162 Bipolar disorder, current episode mixed, moderate: Secondary | ICD-10-CM | POA: Diagnosis not present

## 2018-05-07 ENCOUNTER — Other Ambulatory Visit: Payer: Self-pay | Admitting: Family Medicine

## 2018-05-07 DIAGNOSIS — I491 Atrial premature depolarization: Secondary | ICD-10-CM

## 2018-05-07 DIAGNOSIS — I493 Ventricular premature depolarization: Secondary | ICD-10-CM

## 2018-05-08 NOTE — Telephone Encounter (Signed)
Needs foolow up appt. For BP

## 2018-05-15 ENCOUNTER — Ambulatory Visit: Payer: Self-pay | Admitting: Family Medicine

## 2018-06-19 DIAGNOSIS — S99911A Unspecified injury of right ankle, initial encounter: Secondary | ICD-10-CM | POA: Diagnosis not present

## 2018-06-19 DIAGNOSIS — S93401A Sprain of unspecified ligament of right ankle, initial encounter: Secondary | ICD-10-CM | POA: Diagnosis not present

## 2018-06-20 ENCOUNTER — Other Ambulatory Visit: Payer: Self-pay | Admitting: Family Medicine

## 2018-07-11 DIAGNOSIS — F333 Major depressive disorder, recurrent, severe with psychotic symptoms: Secondary | ICD-10-CM | POA: Diagnosis not present

## 2018-07-18 DIAGNOSIS — F333 Major depressive disorder, recurrent, severe with psychotic symptoms: Secondary | ICD-10-CM | POA: Diagnosis not present

## 2018-07-19 DIAGNOSIS — F3162 Bipolar disorder, current episode mixed, moderate: Secondary | ICD-10-CM | POA: Diagnosis not present

## 2018-08-10 DIAGNOSIS — F333 Major depressive disorder, recurrent, severe with psychotic symptoms: Secondary | ICD-10-CM | POA: Diagnosis not present

## 2018-08-14 ENCOUNTER — Encounter: Payer: Self-pay | Admitting: Physician Assistant

## 2018-08-14 ENCOUNTER — Telehealth: Payer: BLUE CROSS/BLUE SHIELD | Admitting: Physician Assistant

## 2018-08-14 DIAGNOSIS — B9689 Other specified bacterial agents as the cause of diseases classified elsewhere: Secondary | ICD-10-CM | POA: Diagnosis not present

## 2018-08-14 DIAGNOSIS — J208 Acute bronchitis due to other specified organisms: Secondary | ICD-10-CM

## 2018-08-14 MED ORDER — BENZONATATE 100 MG PO CAPS: 100.0000 mg | ORAL_CAPSULE | Freq: Three times a day (TID) | ORAL | 0 refills | Status: DC | PRN

## 2018-08-14 MED ORDER — ALBUTEROL SULFATE HFA 108 (90 BASE) MCG/ACT IN AERS: 2.0000 | INHALATION_SPRAY | RESPIRATORY_TRACT | 0 refills | Status: DC | PRN

## 2018-08-14 MED ORDER — PREDNISONE 5 MG PO TABS: 5.0000 mg | ORAL_TABLET | Freq: Every day | ORAL | 0 refills | Status: DC

## 2018-08-14 MED ORDER — AZITHROMYCIN 250 MG PO TABS: ORAL_TABLET | ORAL | 0 refills | Status: DC

## 2018-08-14 NOTE — Progress Notes (Signed)
We are sorry that you are not feeling well.  Here is how we plan to help!    Based on your presentation I believe you most likely have A cough due to bacteria.  When patients have a fever and a productive cough with a change in color or increased sputum production, we are concerned about bacterial bronchitis.  If left untreated it can progress to pneumonia.  If your symptoms do not improve with your treatment plan it is important that you contact your provider.   I have prescribed Azithromyin 250 mg: two tablets now and then one tablet daily for 4 additonal days    In addition you may use A prescription cough medication called Tessalon Perles 100mg . You may take 1-2 capsules every 8 hours as needed for your cough.     Prednisone 5 mg daily for 6 days (see taper instructions below)  Directions for 6 day taper: Day 1: 2 tablets before breakfast, 1 after both lunch & dinner and 2 at bedtime Day 2: 1 tab before breakfast, 1 after both lunch & dinner and 2 at bedtime Day 3: 1 tab at each meal & 1 at bedtime Day 4: 1 tab at breakfast, 1 at lunch, 1 at bedtime Day 5: 1 tab at breakfast & 1 tab at bedtime Day 6: 1 tab at breakfast     Ms. Frizzell  As per our conversation, you have stated the cough is productive for the past 2 weeks. You denied shortness of breath and explained your chest pain is from "congested lungs" and is mainly felt if you press of your chestwall, none at rest. I have also prescribed an albuterol inhlaer to help with bronchitis and the wheezing. Any worsening, please go to the ER    From your responses in the eVisit questionnaire you describe inflammation in the upper respiratory tract which is causing a significant cough.  This is commonly called Bronchitis and has four common causes:    Allergies  Viral Infections  Acid Reflux  Bacterial Infection Allergies, viruses and acid reflux are treated by controlling symptoms or eliminating the cause. An example might be a  cough caused by taking certain blood pressure medications. You stop the cough by changing the medication. Another example might be a cough caused by acid reflux. Controlling the reflux helps control the cough.  USE OF BRONCHODILATOR ("RESCUE") INHALERS: There is a risk from using your bronchodilator too frequently.  The risk is that over-reliance on a medication which only relaxes the muscles surrounding the breathing tubes can reduce the effectiveness of medications prescribed to reduce swelling and congestion of the tubes themselves.  Although you feel brief relief from the bronchodilator inhaler, your asthma may actually be worsening with the tubes becoming more swollen and filled with mucus.  This can delay other crucial treatments, such as oral steroid medications. If you need to use a bronchodilator inhaler daily, several times per day, you should discuss this with your provider.  There are probably better treatments that could be used to keep your asthma under control.     HOME CARE . Only take medications as instructed by your medical team. . Complete the entire course of an antibiotic. . Drink plenty of fluids and get plenty of rest. . Avoid close contacts especially the very young and the elderly . Cover your mouth if you cough or cough into your sleeve. . Always remember to wash your hands . A steam or ultrasonic humidifier can help congestion.  GET HELP RIGHT AWAY IF: . You develop worsening fever. . You become short of breath . You cough up blood. . Your symptoms persist after you have completed your treatment plan MAKE SURE YOU   Understand these instructions.  Will watch your condition.  Will get help right away if you are not doing well or get worse.  Your e-visit answers were reviewed by a board certified advanced clinical practitioner to complete your personal care plan.  Depending on the condition, your plan could have included both over the counter or prescription  medications. If there is a problem please reply  once you have received a response from your provider. Your safety is important to [place].  If you have drug allergies check your prescription carefully.    You can use MyChart to ask questions about today's visit, request a non-urgent call back, or ask for a work or school excuse for 24 hours related to this e-Visit. If it has been greater than 24 hours you will need to follow up with your provider, or enter a new e-Visit to address those concerns. You will get an e-mail in the next two days asking about your experience.  I hope that your e-visit has been valuable and will speed your recovery. Thank you for using e-visits.  I have spent 7 min in completion and review of this note- [facility]

## 2018-08-17 DIAGNOSIS — F333 Major depressive disorder, recurrent, severe with psychotic symptoms: Secondary | ICD-10-CM | POA: Diagnosis not present

## 2018-08-21 ENCOUNTER — Telehealth: Payer: BLUE CROSS/BLUE SHIELD | Admitting: Family

## 2018-08-21 DIAGNOSIS — R05 Cough: Secondary | ICD-10-CM

## 2018-08-21 DIAGNOSIS — R053 Chronic cough: Secondary | ICD-10-CM

## 2018-08-21 NOTE — Progress Notes (Signed)
Based on what you shared with me, I feel your condition warrants further evaluation and I recommend that you be seen for a face to face office visit.     NOTE: If you entered your credit card information for this eVisit, you will not be charged. You may see a "hold" on your card for the $35 but that hold will drop off and you will not have a charge processed.  If you are having a true medical emergency please call 911.  If you need an urgent face to face visit, [place] has four urgent care centers for your convenience.    PLEASE NOTE: THE [facility] LOCATIONS AND URGENT CARE CLINICS DO NOT HAVE THE TESTING FOR CORONAVIRUS COVID19 AVAILABLE.  IF YOU FEEL YOU NEED THIS TEST YOU MUST GO TO A TRIAGE LOCATION AT ONE OF THE HOSPITAL EMERGENCY DEPARTMENTS   [email or web address] to reserve your spot online an avoid wait times  [facility] [address], [address] [place], [place] [address] 8 am to 8 pm Monday-Friday 10 am to 4 pm Saturday-Sunday *Across the street from [employer]  [facility]  [address] [place], [address] 8 am to 5 pm Monday-Friday * In the [facility]   The following sites will take your insurance:  . [facility]  [phone] Get Driving Directions Find a Provider at this Location  [address] [place], [place] [address] . 10 am to 8 pm Monday-Friday . 12 pm to 8 pm Saturday-Sunday   . [facility]  [phone] Get Driving Directions Find a Provider at this Location  [address], [address] [place], [place] [address] . 8 am to 8 pm Monday-Friday . 9 am to 6 pm Saturday . 11 am to 6 pm Sunday   . [facility] [place]  [phone] Get Driving Directions  [address] [place],  [address] . 8 am to 8 pm Monday-Friday . 8 am to 4 pm Saturday-Sunday   Your e-visit answers were  reviewed by a board certified advanced clinical practitioner to complete your personal care plan.  Thank you for using e-Visits. 

## 2018-08-22 ENCOUNTER — Other Ambulatory Visit: Payer: Self-pay

## 2018-08-22 ENCOUNTER — Telehealth: Payer: BLUE CROSS/BLUE SHIELD | Admitting: Family Medicine

## 2018-08-22 ENCOUNTER — Telehealth (INDEPENDENT_AMBULATORY_CARE_PROVIDER_SITE_OTHER): Payer: BLUE CROSS/BLUE SHIELD | Admitting: Family Medicine

## 2018-08-22 ENCOUNTER — Encounter: Payer: Self-pay | Admitting: Family Medicine

## 2018-08-22 VITALS — Temp 99.1°F

## 2018-08-22 DIAGNOSIS — I491 Atrial premature depolarization: Secondary | ICD-10-CM | POA: Diagnosis not present

## 2018-08-22 DIAGNOSIS — R05 Cough: Secondary | ICD-10-CM | POA: Diagnosis not present

## 2018-08-22 DIAGNOSIS — R059 Cough, unspecified: Secondary | ICD-10-CM

## 2018-08-22 DIAGNOSIS — F319 Bipolar disorder, unspecified: Secondary | ICD-10-CM

## 2018-08-22 DIAGNOSIS — I493 Ventricular premature depolarization: Secondary | ICD-10-CM | POA: Diagnosis not present

## 2018-08-22 DIAGNOSIS — R509 Fever, unspecified: Secondary | ICD-10-CM | POA: Diagnosis not present

## 2018-08-22 MED ORDER — METOPROLOL SUCCINATE ER 25 MG PO TB24: 25.0000 mg | ORAL_TABLET | Freq: Every day | ORAL | 0 refills | Status: DC

## 2018-08-22 NOTE — Progress Notes (Signed)
Virtual Visit via Telephone Note  I connected with Tonya Clark on 08/22/18 at  9:00 AM EDT by telephone and verified that I am speaking with the correct person using two identifiers.   I discussed the limitations, risks, security and privacy concerns of performing an evaluation and management service by telephone and the availability of in person appointments. I also discussed with the patient that there may be a patient responsible charge related to this service. The patient expressed understanding and agreed to proceed.     Subjective:    CC: Bronchitis?   HPI:  23 yo female is c/ of productive cough x 3 weeks.  Has had a low grade fever over the last 2 weeks. Up to 99. Says she does fell SOB with activity. It is mid chest.   Using some dayquail.  Using an inhaler. Had an e visit a week ago.  Dec appetite.  Nauseated initially for about a week but that is better. No diarrhea.  She completed a zpack about 3 days.  Not sure if really helped cough.  She is having some mild nasal congestion and drainage.  She is taking her claritin.  She does have seasonal allergies.   For PVCs metoprolol is working well.  She does need refills.    Bipolar 1 D/O she is now up to 60 mg on her Latuda.  Updated med list. She is currently in [place] where she is in school.   Past medical history, Surgical history, Family history not pertinant except as noted below, Social history, Allergies, and medications have been entered into the medical record, reviewed, and corrections made.   Review of Systems: No fevers, chills, night sweats, weight loss, chest pain, or shortness of breath.   Objective:    General: Speaking clearly in complete sentences without any shortness of breath.  Alert and oriented x3.  Normal judgment. No apparent acute distress.   Impression and Recommendations:   Cough/Fever - gave reassurnac. Unfortunately likely viral since didn't really respond to the Zpack.  Doesn't seem to be gtting  worse. Continue to stay at home and quarantine until fever free for 72 hours.  Call if getting worse or not better by the end of the week.    Seasonal allergies - continue daily claritin for nwo.    PVCs- refilled metoprolol.     Bipolar 1 D/O - management by psychiatry.    I discussed the assessment and treatment plan with the patient. The patient was provided an opportunity to ask questions and all were answered. The patient agreed with the plan and demonstrated an understanding of the instructions.   The patient was advised to call back or seek an in-person evaluation if the symptoms worsen or if the condition fails to improve as anticipated.  I provided   15 minutes of non-face-to-face time during this encounter.   Nani Gasser, MD

## 2018-08-22 NOTE — Progress Notes (Unsigned)
Pt was not able to obtain her VS.  Pt reports that she has been sick x 3 wks via an E-visit on 08/14/2018. She was given tessalon perles 100mg , prednisone 5 mg taper x 6 days and Azithromycin z pack250 mg.  Pt reports that she is no better feels stuffy, low grade fever (upper 99's), headache, and chest pain w/productive cough.

## 2018-08-31 DIAGNOSIS — F333 Major depressive disorder, recurrent, severe with psychotic symptoms: Secondary | ICD-10-CM | POA: Diagnosis not present

## 2018-09-18 ENCOUNTER — Telehealth: Payer: BLUE CROSS/BLUE SHIELD | Admitting: Family

## 2018-09-18 DIAGNOSIS — R05 Cough: Secondary | ICD-10-CM

## 2018-09-18 DIAGNOSIS — R062 Wheezing: Secondary | ICD-10-CM

## 2018-09-18 DIAGNOSIS — R059 Cough, unspecified: Secondary | ICD-10-CM

## 2018-09-18 NOTE — Progress Notes (Signed)
Based on what you shared with me, I feel your condition warrants further evaluation and I recommend that you be seen for a face to face office visit.  Given that your symptoms have returned and no improvement with antibiotics or prednisone, you need to be seen. You may need a chest x-ray. Given your symptoms you need to call your primary care physician or the urgent care before you go as some of these places are not seeing respiratory symptoms patients with the Covid-19. You could start a daily zyrtec.      NOTE: If you entered your credit card information for this eVisit, you will not be charged. You may see a "hold" on your card for the $35 but that hold will drop off and you will not have a charge processed.  If you are having a true medical emergency please call 911.  If you need an urgent face to face visit, [facility] has four urgent care centers for your convenience.    PLEASE NOTE: THE [facility] LOCATIONS AND URGENT CARE CLINICS DO NOT HAVE THE TESTING FOR CORONAVIRUS COVID19 AVAILABLE.  IF YOU FEEL YOU NEED THIS TEST YOU MUST GO TO A TRIAGE LOCATION AT ONE OF THE HOSPITAL EMERGENCY DEPARTMENTS   [email or web address] to reserve your spot online an avoid wait times  [facility] [address] [place], [place] [address] Modified hours of operation: Monday-Friday, 10 AM to 6 PM  Saturday & Sunday 10 AM to 4 PM *Across the street from [employer]  [facility] (New Address!) [address] [place], [place] [address] *Just off [employer], across the road from [place]* Modified hours of operation: Monday-Friday, 10 AM to 5 PM  Closed Saturday & Sunday   The following sites will take your insurance:  . [facility]  [phone] Get Driving Directions Find a Provider at this Location  [address] [place], [place] [address] . 10 am to 8 pm Monday-Friday . 12 pm to 8 pm Saturday-Sunday    . [facility]  [phone] Get Driving Directions Find a Provider at this Location  [address] [place], [place] [address] . 8 am to 8 pm Monday-Friday . 9 am to 6 pm Saturday . 11 am to 6 pm Sunday   . [facility] at [facility]  [phone] Get Driving Directions  [address] [place], [place] [address] . 8 am to 8 pm Monday-Friday . 8 am to 4 pm Saturday-Sunday   Your e-visit answers were reviewed by a board certified advanced clinical practitioner to complete your personal care plan.  Thank you for using e-Visits.

## 2018-09-19 ENCOUNTER — Encounter: Payer: Self-pay | Admitting: Physician Assistant

## 2018-09-19 ENCOUNTER — Other Ambulatory Visit: Payer: Self-pay

## 2018-09-19 ENCOUNTER — Ambulatory Visit (INDEPENDENT_AMBULATORY_CARE_PROVIDER_SITE_OTHER): Payer: BLUE CROSS/BLUE SHIELD | Admitting: Physician Assistant

## 2018-09-19 ENCOUNTER — Ambulatory Visit: Payer: BLUE CROSS/BLUE SHIELD | Admitting: Family Medicine

## 2018-09-19 VITALS — BP 114/81 | HR 94 | Temp 98.4°F | Wt 272.0 lb

## 2018-09-19 DIAGNOSIS — R05 Cough: Secondary | ICD-10-CM | POA: Diagnosis not present

## 2018-09-19 DIAGNOSIS — R062 Wheezing: Secondary | ICD-10-CM | POA: Diagnosis not present

## 2018-09-19 DIAGNOSIS — J0100 Acute maxillary sinusitis, unspecified: Secondary | ICD-10-CM

## 2018-09-19 DIAGNOSIS — R053 Chronic cough: Secondary | ICD-10-CM

## 2018-09-19 DIAGNOSIS — J302 Other seasonal allergic rhinitis: Secondary | ICD-10-CM

## 2018-09-19 MED ORDER — DOXYCYCLINE HYCLATE 100 MG PO TABS: 100.0000 mg | ORAL_TABLET | Freq: Two times a day (BID) | ORAL | 0 refills | Status: AC

## 2018-09-19 MED ORDER — BUDESONIDE-FORMOTEROL FUMARATE 80-4.5 MCG/ACT IN AERO: 2.0000 | INHALATION_SPRAY | Freq: Two times a day (BID) | RESPIRATORY_TRACT | 0 refills | Status: DC

## 2018-09-19 NOTE — Patient Instructions (Addendum)
For nasal symptoms/sinusitis: - take antibiotic for full 7 days as prescribed - nasal saline rinses / netti pot several times per day (do this prior to nasal spray) - warm facial compresses - oral decongestants and antihistamines like Claritin-D and Zyrtec-D may help dry up secretions (caution using decongestants if you have high blood pressure, heart disease or kidney disease) - for sinus headache: Tylenol 1000mg  every 8 hours as needed. Alternate with Ibuprofen 600mg  every 6 hours   Sinusitis, Adult Sinusitis is inflammation of your sinuses. Sinuses are hollow spaces in the bones around your face. Your sinuses are located:  Around your eyes.  In the middle of your forehead.  Behind your nose.  In your cheekbones. Mucus normally drains out of your sinuses. When your nasal tissues become inflamed or swollen, mucus can become trapped or blocked. This allows bacteria, viruses, and fungi to grow, which leads to infection. Most infections of the sinuses are caused by a virus. Sinusitis can develop quickly. It can last for up to 4 weeks (acute) or for more than 12 weeks (chronic). Sinusitis often develops after a cold. What are the causes? This condition is caused by anything that creates swelling in the sinuses or stops mucus from draining. This includes:  Allergies.  Asthma.  Infection from bacteria or viruses.  Deformities or blockages in your nose or sinuses.  Abnormal growths in the nose (nasal polyps).  Pollutants, such as chemicals or irritants in the air.  Infection from fungi (rare). What increases the risk? You are more likely to develop this condition if you:  Have a weak body defense system (immune system).  Do a lot of swimming or diving.  Overuse nasal sprays.  Smoke. What are the signs or symptoms? The main symptoms of this condition are pain and a feeling of pressure around the affected sinuses. Other symptoms include:  Stuffy nose or congestion.  Thick  drainage from your nose.  Swelling and warmth over the affected sinuses.  Headache.  Upper toothache.  A cough that may get worse at night.  Extra mucus that collects in the throat or the back of the nose (postnasal drip).  Decreased sense of smell and taste.  Fatigue.  A fever.  Sore throat.  Bad breath. How is this diagnosed? This condition is diagnosed based on:  Your symptoms.  Your medical history.  A physical exam.  Tests to find out if your condition is acute or chronic. This may include: ? Checking your nose for nasal polyps. ? Viewing your sinuses using a device that has a light (endoscope). ? Testing for allergies or bacteria. ? Imaging tests, such as an MRI or CT scan. In rare cases, a bone biopsy may be done to rule out more serious types of fungal sinus disease. How is this treated? Treatment for sinusitis depends on the cause and whether your condition is chronic or acute.  If caused by a virus, your symptoms should go away on their own within 10 days. You may be given medicines to relieve symptoms. They include: ? Medicines that shrink swollen nasal passages (topical intranasal decongestants). ? Medicines that treat allergies (antihistamines). ? A spray that eases inflammation of the nostrils (topical intranasal corticosteroids). ? Rinses that help get rid of thick mucus in your nose (nasal saline washes).  If caused by bacteria, your health care provider may recommend waiting to see if your symptoms improve. Most bacterial infections will get better without antibiotic medicine. You may be given antibiotics if you  have: ? A severe infection. ? A weak immune system.  If caused by narrow nasal passages or nasal polyps, you may need to have surgery. Follow these instructions at home: Medicines  Take, use, or apply over-the-counter and prescription medicines only as told by your health care provider. These may include nasal sprays.  If you were  prescribed an antibiotic medicine, take it as told by your health care provider. Do not stop taking the antibiotic even if you start to feel better. Hydrate and humidify   Drink enough fluid to keep your urine pale yellow. Staying hydrated will help to thin your mucus.  Use a cool mist humidifier to keep the humidity level in your home above 50%.  Inhale steam for 10-15 minutes, 3-4 times a day, or as told by your health care provider. You can do this in the bathroom while a hot shower is running.  Limit your exposure to cool or dry air. Rest  Rest as much as possible.  Sleep with your head raised (elevated).  Make sure you get enough sleep each night. General instructions   Apply a warm, moist washcloth to your face 3-4 times a day or as told by your health care provider. This will help with discomfort.  Wash your hands often with soap and water to reduce your exposure to germs. If soap and water are not available, use hand sanitizer.  Do not smoke. Avoid being around people who are smoking (secondhand smoke).  Keep all follow-up visits as told by your health care provider. This is important. Contact a health care provider if:  You have a fever.  Your symptoms get worse.  Your symptoms do not improve within 10 days. Get help right away if:  You have a severe headache.  You have persistent vomiting.  You have severe pain or swelling around your face or eyes.  You have vision problems.  You develop confusion.  Your neck is stiff.  You have trouble breathing. Summary  Sinusitis is soreness and inflammation of your sinuses. Sinuses are hollow spaces in the bones around your face.  This condition is caused by nasal tissues that become inflamed or swollen. The swelling traps or blocks the flow of mucus. This allows bacteria, viruses, and fungi to grow, which leads to infection.  If you were prescribed an antibiotic medicine, take it as told by your health care  provider. Do not stop taking the antibiotic even if you start to feel better.  Keep all follow-up visits as told by your health care provider. This is important. This information is not intended to replace advice given to you by your health care provider. Make sure you discuss any questions you have with your health care provider. Document Released: 05/10/2005 Document Revised: 10/10/2017 Document Reviewed: 10/10/2017 Elsevier Interactive Patient Education  2019 [employer]

## 2018-09-19 NOTE — Progress Notes (Signed)
HPI:                                                                Tonya EdwardsMichaela Clark is a 23 y.o. female [facility] today for cough  Patient reports a persistent cough for greater than 4 weeks. Initially cough had infectious features was associated with fever, chest pain and sputum production. Treated with Azithromycin 08/14/18, reported no improvement in her symptoms She was later treated with prednisone and albuterol inhaler.  She reported no improvement with oral steroids, but inhaler was mildly helpful. Reports she has not had a fever for 2 weeks. Continues to endorse nasal congestion, headache, wheezing, sputum, bilateral chest wall pain. Denies history of asthma or tobacco use No known sick contacts, no recent travel outside of the state in the last 2 weeks, no known COVID exposure. She has a history of environmental allergies Hx of pneumonia Jan 2019    Past Medical History:  Diagnosis Date  . Arrhythmia   . Bipolar 1 disorder (HCC) 08/21/2013  . Jaundice, neonatal   . Kidney stone   . Premature birth    37 weeks   Past Surgical History:  Procedure Laterality Date  . TONSILLECTOMY  2009   Social History   Tobacco Use  . Smoking status: Never Smoker  . Smokeless tobacco: Never Used  Substance Use Topics  . Alcohol use: No   family history includes Allergies in her mother; Anxiety disorder in her father; Hypertension in her father and mother.    ROS: negative except as noted in the HPI  Medications: Current Outpatient Medications  Medication Sig Dispense Refill  . albuterol (PROVENTIL HFA;VENTOLIN HFA) 108 (90 Base) MCG/ACT inhaler Inhale 2 puffs into the lungs every 4 (four) hours as needed for wheezing or shortness of breath. 1 Inhaler 0  . buPROPion (WELLBUTRIN SR) 150 MG 12 hr tablet Take 150 mg by mouth daily.   0  . hydrOXYzine (VISTARIL) 25 MG capsule 1 capsule.    Marland Kitchen. LATUDA 60 MG TABS Take 1  tablet by mouth daily.    . metoprolol succinate (TOPROL-XL) 25 MG 24 hr tablet Take 1 tablet (25 mg total) by mouth daily. 90 tablet 0  . OXcarbazepine (TRILEPTAL) 150 MG tablet Take 1 tablet (150 mg total) by mouth 2 (two) times daily. 60 tablet 0  . temazepam (RESTORIL) 15 MG capsule     . topiramate (TOPAMAX) 25 MG tablet TAKE 1 TABLET BY MOUTH TWICE A DAY 180 tablet 1  . budesonide-formoterol (SYMBICORT) 80-4.5 MCG/ACT inhaler Inhale 2 puffs into the lungs 2 (two) times daily for 14 days. 1 Inhaler 0  . doxycycline (VIBRA-TABS) 100 MG tablet Take 1 tablet (100 mg total) by mouth 2 (two) times daily for 7 days. 14 tablet 0  . etonogestrel (NEXPLANON) 68 MG IMPL implant 1 each (68 mg total) by Subdermal route once for 1 dose. Inserted 06/01/17 1 each 0   No current facility-administered medications for this visit.    Allergies  Allergen Reactions  . Lamictal [Lamotrigine] Rash       Objective:  BP 114/81   Pulse 94   Temp 98.4 F (36.9 C) (Oral)   Wt 272 lb (123.4 kg)  SpO2 97%   BMI 43.90 kg/m  Gen:  alert, not ill-appearing, no distress, appropriate for age HEENT: head normocephalic without obvious abnormality, conjunctiva and cornea clear, trachea midline Pulm: Normal work of breathing, normal phonation, clear to auscultation bilaterally, no wheezes, rales or rhonchi CV: Normal rate, regular rhythm, s1 and s2 distinct, no murmurs, clicks or rubs  Neuro: alert and oriented x 3, no tremor MSK: extremities atraumatic, normal gait and station Skin: intact, no rashes on exposed skin, no jaundice, no cyanosis Psych: well-groomed, cooperative, good eye contact, euthymic mood, affect mood-congruent, speech is articulate, and thought processes clear and goal-directed    No results found for this or any previous visit (from the past 72 hour(s)). No results found.    Assessment and Plan: 23 y.o. female with   .Diagnoses and all orders for this visit:  Persistent cough for  3 weeks or longer -     budesonide-formoterol (SYMBICORT) 80-4.5 MCG/ACT inhaler; Inhale 2 puffs into the lungs 2 (two) times daily for 14 days.  Acute non-recurrent maxillary sinusitis -     doxycycline (VIBRA-TABS) 100 MG tablet; Take 1 tablet (100 mg total) by mouth 2 (two) times daily for 7 days.  Seasonal allergic rhinitis, unspecified trigger  Symptom of wheezing -     budesonide-formoterol (SYMBICORT) 80-4.5 MCG/ACT inhaler; Inhale 2 puffs into the lungs 2 (two) times daily for 14 days.   Afebrile, no tachypnea, no tachycardia, pulse ox 97% on room air at rest, no adventitious lung sounds Recently treated empirically for community-acquired pneumonia with azithromycin on 08-14-18 Patient noted no improvement in symptoms Headache and nasal congestion suggest possible bacterial sinusitis.  Treating empirically with doxycycline Subjective wheezing and environmental allergies suggest possible underlying reactive airway disease.  Starting Symbicort 2 puffs twice a day for 2 weeks Counseled on supportive care    Patient education and anticipatory guidance given Patient agrees with treatment plan Follow-up as needed if symptoms worsen or fail to improve  Levonne Hubert PA-C

## 2018-10-06 IMAGING — DX DG CHEST 2V
2 series · 2 of 2 positions shown · non-contrast
Comparison: PA and lateral chest x-ray October 12, 2014

CLINICAL DATA: Onset of left-sided pleuritic chest pain
approximately 4 hours ago.

EXAM:
CHEST  2 VIEW

[chest pa]
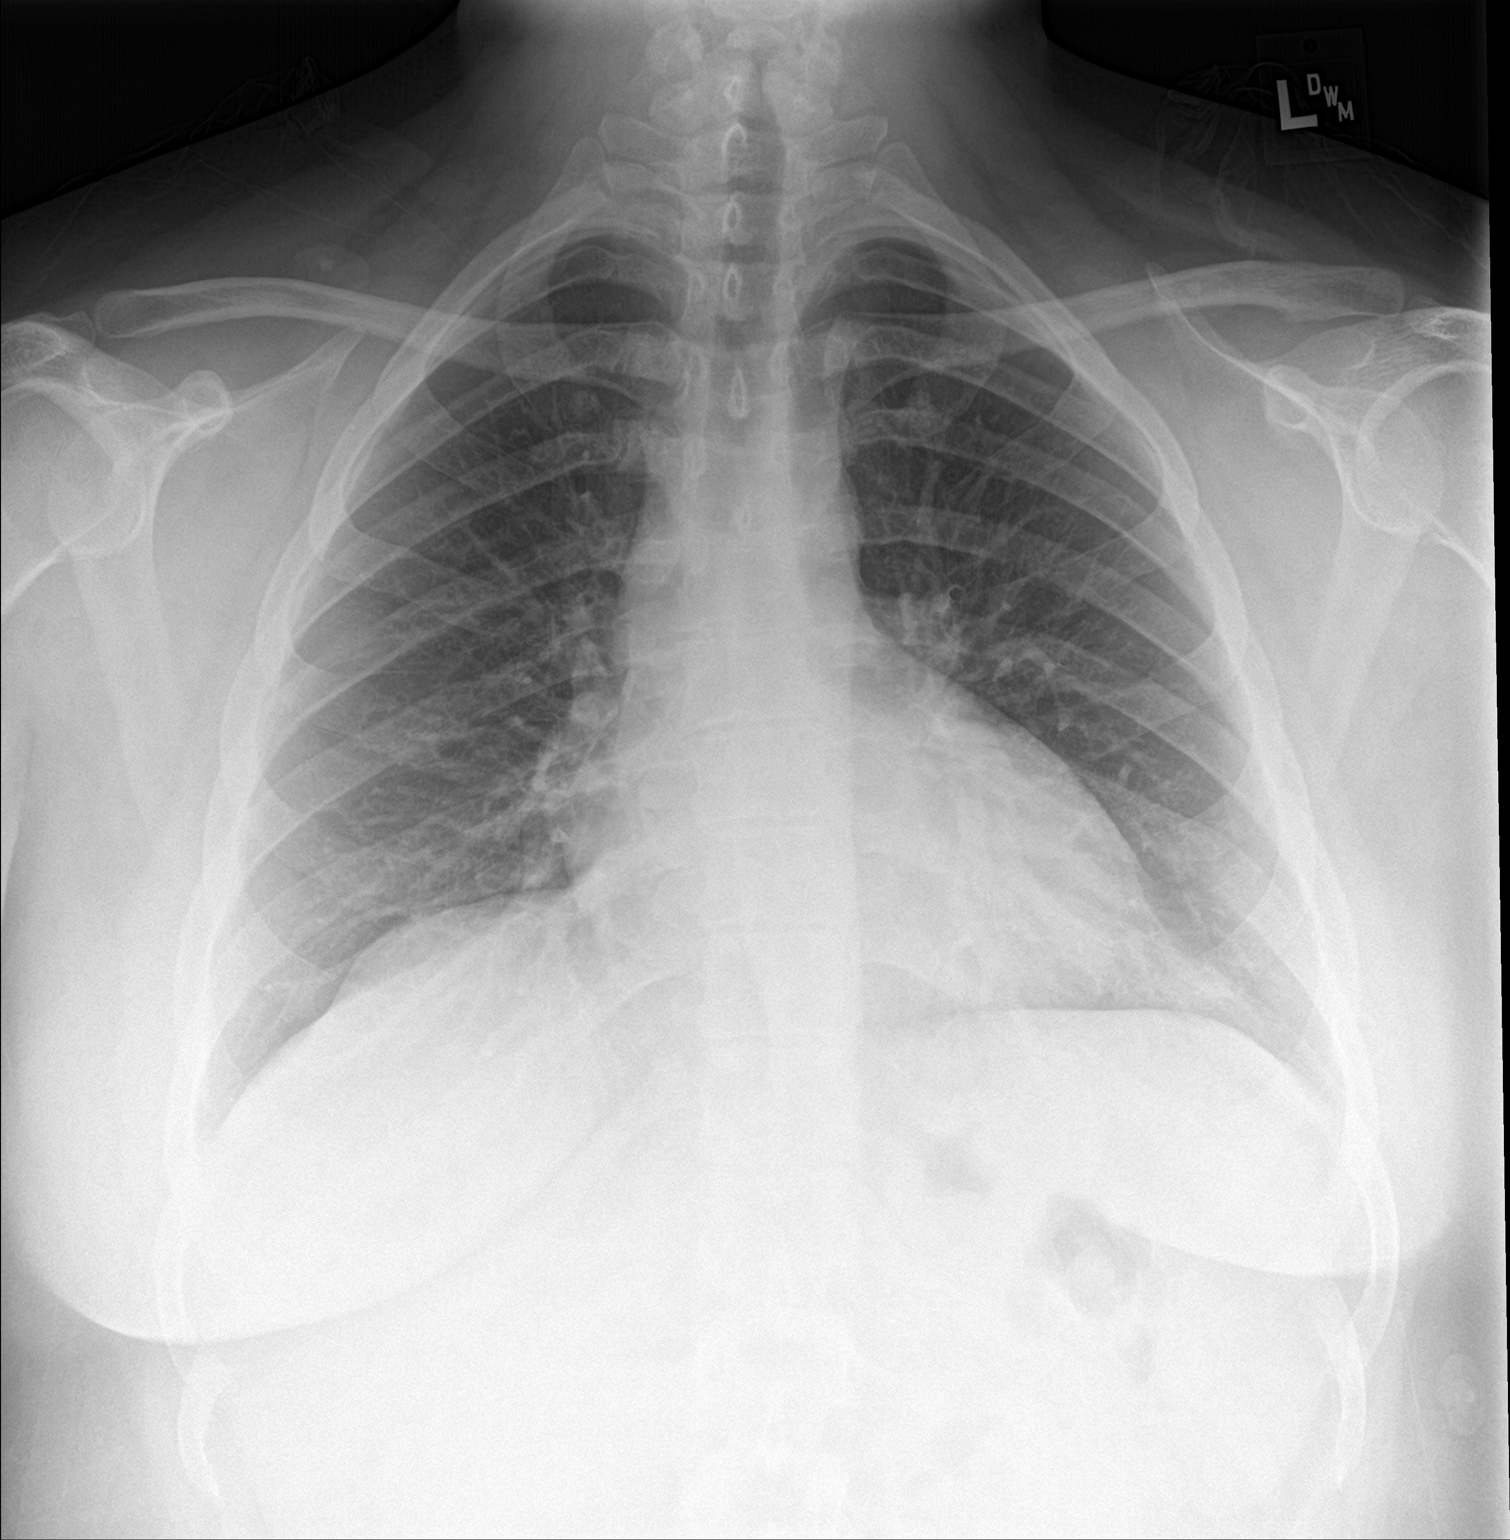

[chest lat]
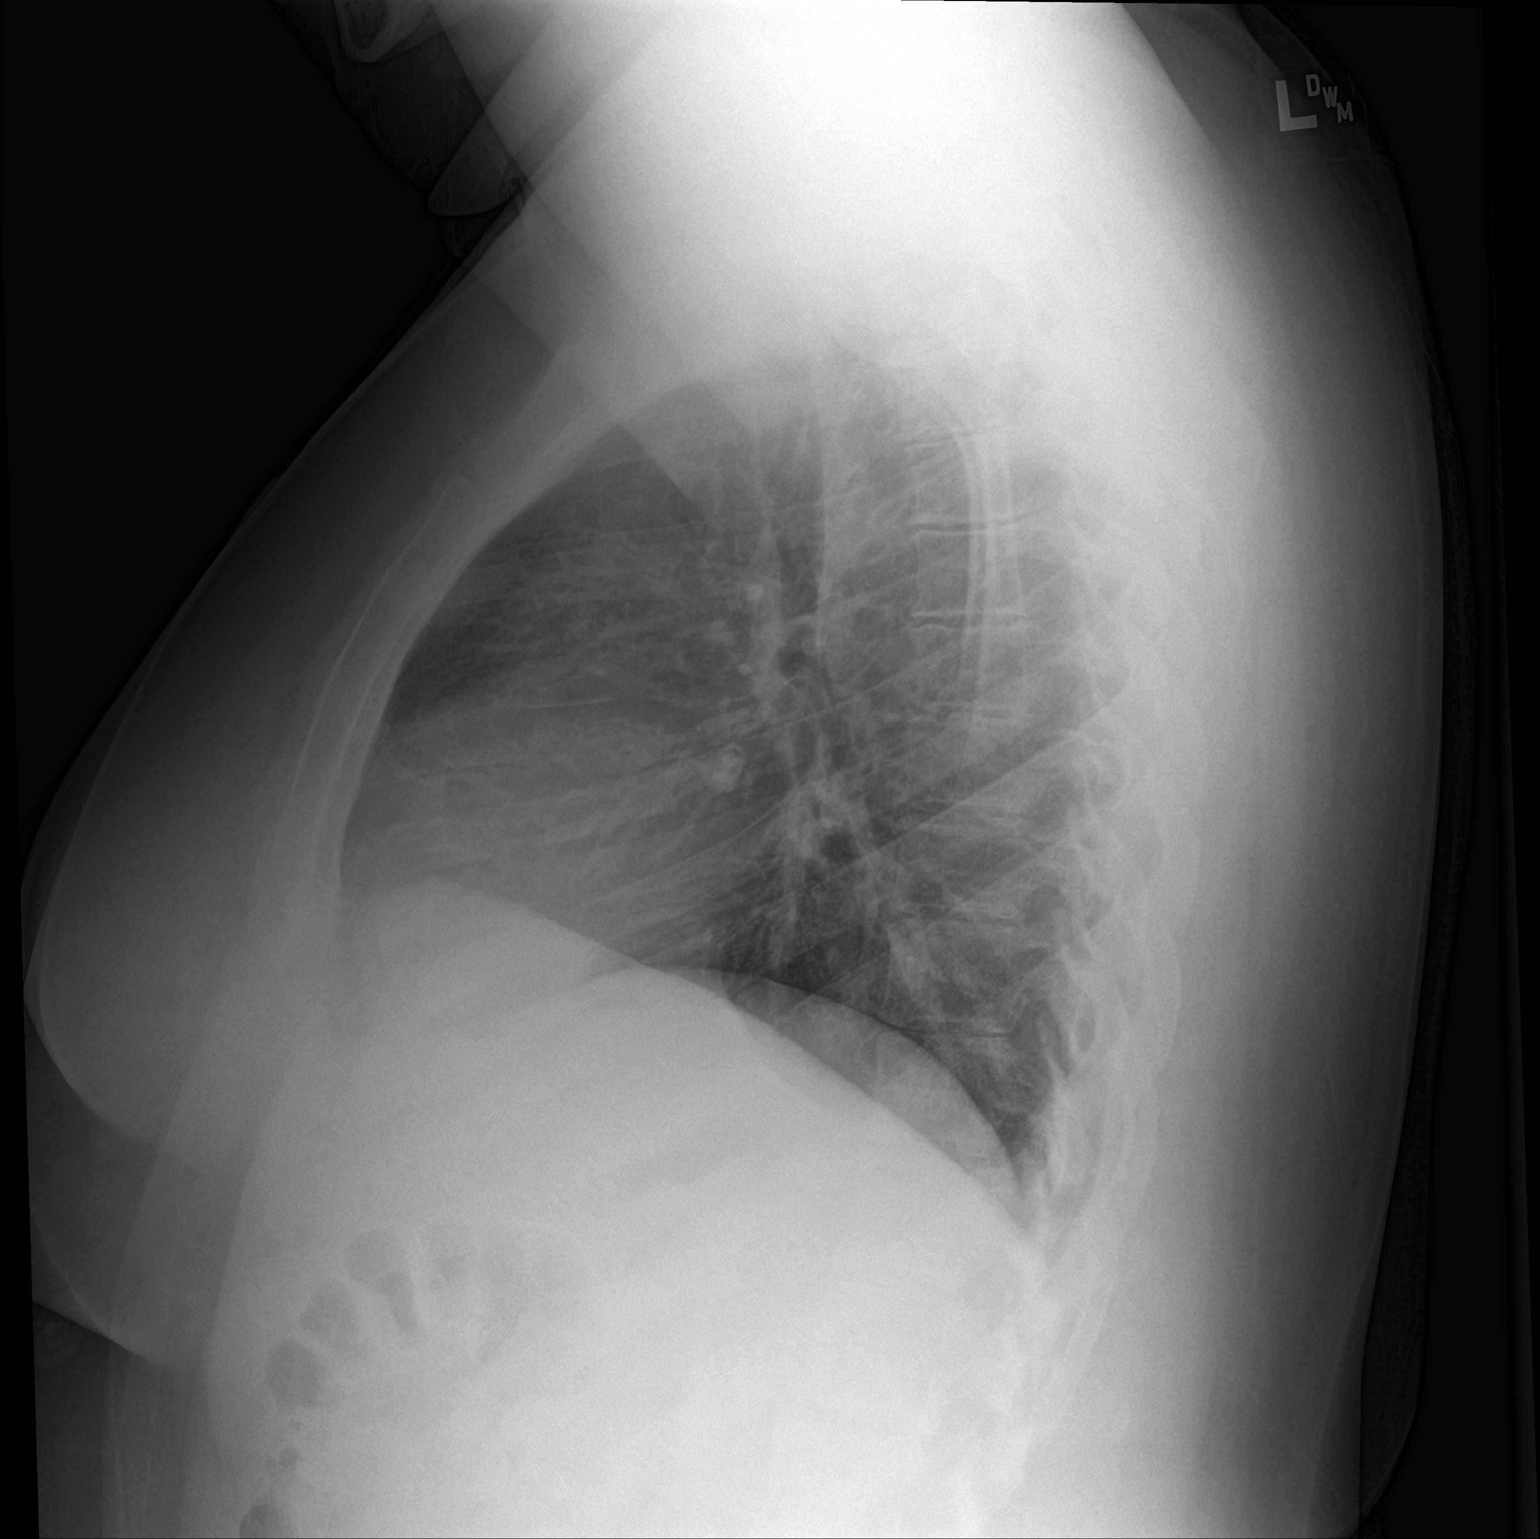

[2 of 2 positions shown; findings below may reference images not displayed]

FINDINGS: The lungs are adequately inflated. There is subtle increased density
just above the midportion of the left hemidiaphragm which obscures
the cardiac apex. The heart and pulmonary vascularity are normal.
There is no pleural effusion. The mediastinum is normal in width.
The bony thorax exhibits no acute abnormality.
IMPRESSION: Subsegmental atelectasis or early pneumonia laterally in the
lingula. Follow-up radiographs following anticipated antibiotic
therapy are recommended unless the patient's symptoms completely
resolve.

## 2018-10-11 ENCOUNTER — Other Ambulatory Visit: Payer: Self-pay | Admitting: Physician Assistant

## 2018-10-11 DIAGNOSIS — R05 Cough: Secondary | ICD-10-CM

## 2018-10-11 DIAGNOSIS — R053 Chronic cough: Secondary | ICD-10-CM

## 2018-10-11 DIAGNOSIS — F3162 Bipolar disorder, current episode mixed, moderate: Secondary | ICD-10-CM | POA: Diagnosis not present

## 2018-10-11 DIAGNOSIS — R062 Wheezing: Secondary | ICD-10-CM

## 2018-10-17 DIAGNOSIS — F333 Major depressive disorder, recurrent, severe with psychotic symptoms: Secondary | ICD-10-CM | POA: Diagnosis not present

## 2018-10-24 DIAGNOSIS — F333 Major depressive disorder, recurrent, severe with psychotic symptoms: Secondary | ICD-10-CM | POA: Diagnosis not present

## 2018-10-29 DIAGNOSIS — H6690 Otitis media, unspecified, unspecified ear: Secondary | ICD-10-CM | POA: Diagnosis not present

## 2018-10-29 DIAGNOSIS — J309 Allergic rhinitis, unspecified: Secondary | ICD-10-CM | POA: Diagnosis not present

## 2018-11-08 DIAGNOSIS — F3162 Bipolar disorder, current episode mixed, moderate: Secondary | ICD-10-CM | POA: Diagnosis not present

## 2018-11-15 DIAGNOSIS — F3162 Bipolar disorder, current episode mixed, moderate: Secondary | ICD-10-CM | POA: Diagnosis not present

## 2018-11-18 ENCOUNTER — Other Ambulatory Visit: Payer: Self-pay | Admitting: Family Medicine

## 2018-11-18 DIAGNOSIS — R110 Nausea: Secondary | ICD-10-CM | POA: Diagnosis not present

## 2018-11-18 DIAGNOSIS — Z79899 Other long term (current) drug therapy: Secondary | ICD-10-CM | POA: Diagnosis not present

## 2018-11-18 DIAGNOSIS — I491 Atrial premature depolarization: Secondary | ICD-10-CM

## 2018-11-18 DIAGNOSIS — N201 Calculus of ureter: Secondary | ICD-10-CM | POA: Diagnosis not present

## 2018-11-18 DIAGNOSIS — N132 Hydronephrosis with renal and ureteral calculous obstruction: Secondary | ICD-10-CM | POA: Diagnosis not present

## 2018-11-18 DIAGNOSIS — R109 Unspecified abdominal pain: Secondary | ICD-10-CM | POA: Diagnosis not present

## 2018-11-18 DIAGNOSIS — I493 Ventricular premature depolarization: Secondary | ICD-10-CM

## 2018-12-22 ENCOUNTER — Other Ambulatory Visit: Payer: Self-pay | Admitting: Family Medicine

## 2018-12-28 DIAGNOSIS — F3162 Bipolar disorder, current episode mixed, moderate: Secondary | ICD-10-CM | POA: Diagnosis not present

## 2019-01-04 ENCOUNTER — Telehealth (INDEPENDENT_AMBULATORY_CARE_PROVIDER_SITE_OTHER): Payer: BC Managed Care – PPO | Admitting: Family Medicine

## 2019-01-04 ENCOUNTER — Encounter: Payer: Self-pay | Admitting: *Deleted

## 2019-01-04 ENCOUNTER — Encounter: Payer: Self-pay | Admitting: Family Medicine

## 2019-01-04 ENCOUNTER — Other Ambulatory Visit: Payer: Self-pay | Admitting: *Deleted

## 2019-01-04 DIAGNOSIS — G43009 Migraine without aura, not intractable, without status migrainosus: Secondary | ICD-10-CM

## 2019-01-04 DIAGNOSIS — I491 Atrial premature depolarization: Secondary | ICD-10-CM | POA: Diagnosis not present

## 2019-01-04 DIAGNOSIS — G43909 Migraine, unspecified, not intractable, without status migrainosus: Secondary | ICD-10-CM | POA: Insufficient documentation

## 2019-01-04 DIAGNOSIS — I493 Ventricular premature depolarization: Secondary | ICD-10-CM

## 2019-01-04 MED ORDER — METOPROLOL SUCCINATE ER 25 MG PO TB24: 25.0000 mg | ORAL_TABLET | Freq: Every day | ORAL | 1 refills | Status: DC

## 2019-01-04 MED ORDER — TOPIRAMATE 50 MG PO TABS: 25.0000 mg | ORAL_TABLET | Freq: Two times a day (BID) | ORAL | 1 refills | Status: DC

## 2019-01-04 NOTE — Progress Notes (Signed)
Virtual Visit via Video Note  I connected with Tonya EdwardsMichaela Minar on 01/05/19 at  3:00 PM EDT by a video enabled telemedicine application and verified that I am speaking with the correct person using two identifiers.   I discussed the limitations of evaluation and management by telemedicine and the availability of in person appointments. The patient expressed understanding and agreed to proceed.  Pt was at home and I was in my office for the virtual visit.       Established Patient Office Visit  Subjective:  Patient ID: Tonya Clark, female    DOB: 09-Mar-1996  Age: 23 y.o. MRN: [id number]  CC:  Chief Complaint  Patient presents with  . Hypertension    HPI Tonya Clark presents for  F/U PVCs - she is doing well on her metoprolol.  No problems or concerns with the medication she feels like it has been helpful.  Headaches - she had 1-2 migraines in the last month. She has had daily HA for most of this week.  No known triggers.  No change sin meds or sleep. She would like to try to increase her topamax.  Currently on 25mg .    She is back at [employer] for college. 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    Past Medical History:  Diagnosis Date  . Arrhythmia   . Bipolar 1 disorder (HCC) 08/21/2013  . Jaundice, neonatal   . Kidney stone   . Premature birth    37 weeks    Past Surgical History:  Procedure Laterality Date  . TONSILLECTOMY  2009    Family History  Problem Relation Age of Onset  . Hypertension Mother   . Allergies Mother   . Anxiety disorder Father   . Hypertension Father     Social History   Socioeconomic History  . Marital status: Single    Spouse name: Not on file  . Number of children: Not on file  . Years of education: Not on file  . Highest education level: Not on file  Occupational History  . Occupation: Consulting civil engineertudent  Social Needs  . Financial resource strain: Not on file  . Food insecurity    Worry: Not on file    Inability: Not on file  .  Transportation needs    Medical: Not on file    Non-medical: Not on file  Tobacco Use  . Smoking status: Never Smoker  . Smokeless tobacco: Never Used  Substance and Sexual Activity  . Alcohol use: No  . Drug use: No  . Sexual activity: Not on file  Lifestyle  . Physical activity    Days per week: Not on file    Minutes per session: Not on file  . Stress: Not on file  Relationships  . Social Musicianconnections    Talks on phone: Not on file    Gets together: Not on file    Attends religious service: Not on file    Active member of club or organization: Not on file    Attends meetings of clubs or organizations: Not on file    Relationship status: Not on file  . Intimate partner violence    Fear of current or ex partner: Not on file    Emotionally abused: Not on file    Physically abused: Not on file    Forced sexual activity: Not on file  Other Topics Concern  . Not on file  Social History Narrative   Was home schooled for HS. She is now  working at a [employer] 2-3 times per week. Born in [place].     Outpatient Medications Prior to Visit  Medication Sig Dispense Refill  . buPROPion (WELLBUTRIN SR) 150 MG 12 hr tablet Take 150 mg by mouth daily.   0  . hydrOXYzine (VISTARIL) 25 MG capsule 1 capsule.    Marland Kitchen LATUDA 80 MG TABS tablet Take 80 mg by mouth daily.    . OXcarbazepine (TRILEPTAL) 150 MG tablet Take 1 tablet (150 mg total) by mouth 2 (two) times daily. 60 tablet 0  . temazepam (RESTORIL) 15 MG capsule     . metoprolol succinate (TOPROL-XL) 25 MG 24 hr tablet TAKE 1 TABLET BY MOUTH EVERY DAY 90 tablet 0  . topiramate (TOPAMAX) 25 MG tablet TAKE 1 TABLET BY MOUTH TWICE A DAY 180 tablet 1  . etonogestrel (NEXPLANON) 68 MG IMPL implant 1 each (68 mg total) by Subdermal route once for 1 dose. Inserted 06/01/17 1 each 0  . albuterol (PROVENTIL HFA;VENTOLIN HFA) 108 (90 Base) MCG/ACT inhaler Inhale 2 puffs into the lungs every 4 (four) hours as needed for wheezing or  shortness of breath. 1 Inhaler 0  . SYMBICORT 80-4.5 MCG/ACT inhaler INHALE 2 PUFFS INTO THE LUNGS 2 TIMES DAILY 10.2 Inhaler 1   No facility-administered medications prior to visit.     Allergies  Allergen Reactions  . Lamictal [Lamotrigine] Rash    ROS Review of Systems    Objective:    Physical Exam  Ht 5\' 6"  (1.676 m)   BMI 43.90 kg/m  Wt Readings from Last 3 Encounters:  09/19/18 272 lb (123.4 kg)  12/07/17 252 lb (114.3 kg)  11/28/17 255 lb (115.7 kg)     Health Maintenance Due  Topic Date Due  . HIV Screening  06/09/2011  . PAP-Cervical Cytology Screening  06/08/2017  . PAP SMEAR-Modifier  06/08/2017  . INFLUENZA VACCINE  12/23/2018    There are no preventive care reminders to display for this patient.  Lab Results  Component Value Date   TSH 1.03 09/05/2017   Lab Results  Component Value Date   WBC 14.1 (H) 05/30/2017   HGB 12.4 05/30/2017   HCT 39.5 05/30/2017   MCV 74.4 (L) 05/30/2017   PLT 322 05/30/2017   Lab Results  Component Value Date   NA 137 05/30/2017   K 4.0 05/30/2017   CO2 22 05/30/2017   GLUCOSE 87 05/30/2017   BUN 9 05/30/2017   CREATININE 0.82 05/30/2017   BILITOT 0.7 05/30/2017   ALKPHOS 68 05/30/2017   AST 20 05/30/2017   ALT 16 05/30/2017   PROT 7.9 05/30/2017   ALBUMIN 4.3 05/30/2017   CALCIUM 9.5 05/30/2017   ANIONGAP 12 05/30/2017   Lab Results  Component Value Date   CHOL 148 09/05/2017   Lab Results  Component Value Date   HDL 32 (A) 09/05/2017   Lab Results  Component Value Date   LDLCALC 95 09/05/2017   Lab Results  Component Value Date   TRIG 105 09/05/2017   No results found for: CHOLHDL No results found for: HGBA1C    Assessment & Plan:   Problem List Items Addressed This Visit      Cardiovascular and Mediastinum   PVC (premature ventricular contraction)   Relevant Medications   metoprolol succinate (TOPROL-XL) 25 MG 24 hr tablet   PAC (premature atrial contraction)    Doing well.  Med refilled.       Relevant Medications   metoprolol succinate (  TOPROL-XL) 25 MG 24 hr tablet   Migraine headache    Not well controlled. Increase topamax to 50mg  bid. F/U in 3 months.       Relevant Medications   metoprolol succinate (TOPROL-XL) 25 MG 24 hr tablet   topiramate (TOPAMAX) 50 MG tablet      Meds ordered this encounter  Medications  . metoprolol succinate (TOPROL-XL) 25 MG 24 hr tablet    Sig: Take 1 tablet (25 mg total) by mouth daily.    Dispense:  90 tablet    Refill:  1  . topiramate (TOPAMAX) 50 MG tablet    Sig: Take 0.5 tablets (25 mg total) by mouth 2 (two) times daily.    Dispense:  180 tablet    Refill:  1    Follow-up: No follow-ups on file.   I discussed the assessment and treatment plan with the patient. The patient was provided an opportunity to ask questions and all were answered. The patient agreed with the plan and demonstrated an understanding of the instructions.   The patient was advised to call back or seek an in-person evaluation if the symptoms worsen or if the condition fails to improve as anticipated.     Nani Gasseratherine , MD

## 2019-01-04 NOTE — Assessment & Plan Note (Addendum)
Not well controlled. Increase topamax to 50mg  bid. F/U in 3 months.

## 2019-01-04 NOTE — Progress Notes (Signed)
Pt would like to talk to Dr. Madilyn Fireman about increasing her Topamax.Marland KitchenMarland KitchenElouise Clark, [place]

## 2019-01-05 ENCOUNTER — Encounter: Payer: Self-pay | Admitting: Family Medicine

## 2019-01-05 NOTE — Assessment & Plan Note (Signed)
Doing well. Med refilled.

## 2019-01-08 MED ORDER — TOPIRAMATE 50 MG PO TABS: 50.0000 mg | ORAL_TABLET | Freq: Two times a day (BID) | ORAL | 1 refills | Status: DC

## 2019-01-08 NOTE — Telephone Encounter (Signed)
Updated correct sig on script.

## 2019-01-24 ENCOUNTER — Encounter: Payer: Self-pay | Admitting: Family Medicine

## 2019-01-30 MED ORDER — NORETHINDRONE ACET-ETHINYL EST 1.5-30 MG-MCG PO TABS: 1.0000 | ORAL_TABLET | Freq: Every day | ORAL | 4 refills | Status: DC

## 2019-03-07 ENCOUNTER — Telehealth: Payer: BC Managed Care – PPO | Admitting: Family

## 2019-03-07 DIAGNOSIS — Z20822 Contact with and (suspected) exposure to covid-19: Secondary | ICD-10-CM

## 2019-03-07 DIAGNOSIS — Z20828 Contact with and (suspected) exposure to other viral communicable diseases: Secondary | ICD-10-CM

## 2019-03-07 NOTE — Progress Notes (Signed)
E-Visit for Corona Virus Screening   Your current symptoms could be consistent with the coronavirus.  Many health care providers can now test patients at their office but not all are.  [facility] has multiple testing sites. For information on our COVID testing locations and hours go to [email or web address]  Please quarantine yourself while awaiting your test results.     You can go to one of the  testing sites listed below, while they are opened (see hours). You do not need an order and will stay in your car during the test. You do need to self isolate until your results return and if positive 14 days from when your symptoms started and until you are 3 days symptom free.   Testing Locations (Monday - Friday, 8 a.m. - 3:30 p.m.) . [place]: [facility], [address], [place], [place]  . [place]: [facility], [address], [place], [place] (entrance off [address])  . [place]: (Closed each Monday): Testing site relocated to the short stay covered drive at [facility]. (Use the [address] entrance to [facility] next to [facility].)    COVID-19 is a respiratory illness with symptoms that are similar to the flu. Symptoms are typically mild to moderate, but there have been cases of severe illness and death due to the virus. The following symptoms may appear 2-14 days after exposure: . Fever . Cough . Shortness of breath or difficulty breathing . Chills . Repeated shaking with chills . Muscle pain . Headache . Sore throat . New loss of taste or smell . Fatigue . Congestion or runny nose . Nausea or vomiting . Diarrhea  It is vitally important that if you feel that you have an infection such as this virus or any other virus that you stay home and away from places where you may spread it to others.  You should self-quarantine for 14 days if you have symptoms that  could potentially be coronavirus or have been in close contact a with a person diagnosed with COVID-19 within the last 2 weeks. You should avoid contact with people age 65 and older.   You should wear a mask or cloth face covering over your nose and mouth if you must be around other people or animals, including pets (even at home). Try to stay at least 6 feet away from other people. This will protect the people around you.  You may also take acetaminophen (Tylenol) as needed for fever.   Reduce your risk of any infection by using the same precautions used for avoiding the common cold or flu:  . Wash your hands often with soap and warm water for at least 20 seconds.  If soap and water are not readily available, use an alcohol-based hand sanitizer with at least 60% alcohol.  . If coughing or sneezing, cover your mouth and nose by coughing or sneezing into the elbow areas of your shirt or coat, into a tissue or into your sleeve (not your hands). . Avoid shaking hands with others and consider head nods or verbal greetings only. . Avoid touching your eyes, nose, or mouth with unwashed hands.  . Avoid close contact with people who are sick. . Avoid places or events with large numbers of people in one location, like concerts or sporting events. . Carefully consider travel plans you have or are making. . If you are planning any travel outside or inside the [place],   visit the CDC's Travelers' Health webpage for the latest health notices. . If you have some symptoms but not all symptoms, continue to monitor at home and seek medical attention if your symptoms worsen. . If you are having a medical emergency, call 911.  HOME CARE . Only take medications as instructed by your medical team. . Drink plenty of fluids and get plenty of rest. . A steam or ultrasonic humidifier can help if you have congestion.   GET HELP RIGHT AWAY IF YOU HAVE EMERGENCY WARNING SIGNS** FOR COVID-19. If you or someone is showing any  of these signs seek emergency medical care immediately. Call 911 or proceed to your closest emergency facility if: . You develop worsening high fever. . Trouble breathing . Bluish lips or face . Persistent pain or pressure in the chest . New confusion . Inability to wake or stay awake . You cough up blood. . Your symptoms become more severe  **This list is not all possible symptoms. Contact your medical provider for any symptoms that are sever or concerning to you.   MAKE SURE YOU   Understand these instructions.  Will watch your condition.  Will get help right away if you are not doing well or get worse.  Your e-visit answers were reviewed by a board certified advanced clinical practitioner to complete your personal care plan.  Depending on the condition, your plan could have included both over the counter or prescription medications.  If there is a problem please reply once you have received a response from your provider.  Your safety is important to us.  If you have drug allergies check your prescription carefully.    You can use MyChart to ask questions about today's visit, request a non-urgent call back, or ask for a work or school excuse for 24 hours related to this e-Visit. If it has been greater than 24 hours you will need to follow up with your provider, or enter a new e-Visit to address those concerns. You will get an e-mail in the next two days asking about your experience.  I hope that your e-visit has been valuable and will speed your recovery. Thank you for using e-visits.   Approximately 5 minutes was spent documenting and reviewing patient's chart.   

## 2019-03-28 DIAGNOSIS — F3162 Bipolar disorder, current episode mixed, moderate: Secondary | ICD-10-CM | POA: Diagnosis not present

## 2019-03-30 ENCOUNTER — Encounter: Payer: Self-pay | Admitting: Family Medicine

## 2019-03-30 MED ORDER — TOPIRAMATE 100 MG PO TABS: 100.0000 mg | ORAL_TABLET | Freq: Two times a day (BID) | ORAL | 1 refills | Status: DC

## 2019-04-05 MED ORDER — PREDNISONE 10 MG PO TABS: ORAL_TABLET | ORAL | 0 refills | Status: DC

## 2019-04-05 NOTE — Addendum Note (Signed)
Addended by: Beatrice Lecher D on: 04/05/2019 07:09 AM   Modules accepted: Orders

## 2019-04-12 ENCOUNTER — Telehealth: Payer: Self-pay | Admitting: Family Medicine

## 2019-04-12 NOTE — Telephone Encounter (Signed)
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 Didn't see any appropriate slots for a physical either. Please Advise.

## 2019-04-12 NOTE — Telephone Encounter (Signed)
lvm advising pt to return call to see what time/day will work better for her so that if we can we will make an effort to accommodate her.Tonya Clark, [place]

## 2019-04-22 ENCOUNTER — Other Ambulatory Visit: Payer: Self-pay | Admitting: Family Medicine

## 2019-04-23 ENCOUNTER — Ambulatory Visit (INDEPENDENT_AMBULATORY_CARE_PROVIDER_SITE_OTHER): Payer: BC Managed Care – PPO | Admitting: Family Medicine

## 2019-04-23 ENCOUNTER — Other Ambulatory Visit: Payer: Self-pay

## 2019-04-23 VITALS — BP 145/80 | HR 92 | Temp 98.1°F | Wt 266.0 lb

## 2019-04-23 DIAGNOSIS — Z23 Encounter for immunization: Secondary | ICD-10-CM | POA: Diagnosis not present

## 2019-04-23 NOTE — Progress Notes (Signed)
Tb skin test Tuberculin skin test applied to right  forearm. Pt tolerated without immediate complications. Pt instructed to return to office to have read. Pt kept form to be completed.Marland Kitchen

## 2019-04-23 NOTE — Progress Notes (Signed)
Agree with documentation as above.   [name], MD  

## 2019-04-24 ENCOUNTER — Ambulatory Visit (INDEPENDENT_AMBULATORY_CARE_PROVIDER_SITE_OTHER): Payer: BC Managed Care – PPO | Admitting: Family Medicine

## 2019-04-24 ENCOUNTER — Encounter: Payer: Self-pay | Admitting: Family Medicine

## 2019-04-24 VITALS — Ht 66.0 in

## 2019-04-24 DIAGNOSIS — G43009 Migraine without aura, not intractable, without status migrainosus: Secondary | ICD-10-CM | POA: Diagnosis not present

## 2019-04-24 DIAGNOSIS — Z0000 Encounter for general adult medical examination without abnormal findings: Secondary | ICD-10-CM

## 2019-04-24 DIAGNOSIS — Z23 Encounter for immunization: Secondary | ICD-10-CM | POA: Diagnosis not present

## 2019-04-24 MED ORDER — TOPIRAMATE 25 MG PO TABS: ORAL_TABLET | ORAL | 0 refills | Status: DC

## 2019-04-24 MED ORDER — AJOVY 225 MG/1.5ML ~~LOC~~ SOAJ: 225.0000 mg | SUBCUTANEOUS | 3 refills | Status: DC

## 2019-04-24 MED ORDER — SUMATRIPTAN SUCCINATE 50 MG PO TABS: 50.0000 mg | ORAL_TABLET | ORAL | 0 refills | Status: DC | PRN

## 2019-04-24 NOTE — Patient Instructions (Signed)
Health Maintenance, Female Adopting a healthy lifestyle and getting preventive care are important in promoting health and wellness. Ask your health care provider about:  The right schedule for you to have regular tests and exams.  Things you can do on your own to prevent diseases and keep yourself healthy. What should I know about diet, weight, and exercise? Eat a healthy diet   Eat a diet that includes plenty of vegetables, fruits, low-fat dairy products, and lean protein.  Do not eat a lot of foods that are high in solid fats, added sugars, or sodium. Maintain a healthy weight Body mass index (BMI) is used to identify weight problems. It estimates body fat based on height and weight. Your health care provider can help determine your BMI and help you achieve or maintain a healthy weight. Get regular exercise Get regular exercise. This is one of the most important things you can do for your health. Most adults should:  Exercise for at least 150 minutes each week. The exercise should increase your heart rate and make you sweat (moderate-intensity exercise).  Do strengthening exercises at least twice a week. This is in addition to the moderate-intensity exercise.  Spend less time sitting. Even light physical activity can be beneficial. Watch cholesterol and blood lipids Have your blood tested for lipids and cholesterol at 22 years of age, then have this test every 5 years. Have your cholesterol levels checked more often if:  Your lipid or cholesterol levels are high.  You are older than 22 years of age.  You are at high risk for heart disease. What should I know about cancer screening? Depending on your health history and family history, you may need to have cancer screening at various ages. This may include screening for:  Breast cancer.  Cervical cancer.  Colorectal cancer.  Skin cancer.  Lung cancer. What should I know about heart disease, diabetes, and high blood  pressure? Blood pressure and heart disease  High blood pressure causes heart disease and increases the risk of stroke. This is more likely to develop in people who have high blood pressure readings, are of African descent, or are overweight.  Have your blood pressure checked: ? Every 3-5 years if you are 18-39 years of age. ? Every year if you are 40 years old or older. Diabetes Have regular diabetes screenings. This checks your fasting blood sugar level. Have the screening done:  Once every three years after age 40 if you are at a normal weight and have a low risk for diabetes.  More often and at a younger age if you are overweight or have a high risk for diabetes. What should I know about preventing infection? Hepatitis B If you have a higher risk for hepatitis B, you should be screened for this virus. Talk with your health care provider to find out if you are at risk for hepatitis B infection. Hepatitis C Testing is recommended for:  Everyone born from 1945 through 1965.  Anyone with known risk factors for hepatitis C. Sexually transmitted infections (STIs)  Get screened for STIs, including gonorrhea and chlamydia, if: ? You are sexually active and are younger than 22 years of age. ? You are older than 22 years of age and your health care provider tells you that you are at risk for this type of infection. ? Your sexual activity has changed since you were last screened, and you are at increased risk for chlamydia or gonorrhea. Ask your health care provider if   you are at risk.  Ask your health care provider about whether you are at high risk for HIV. Your health care provider may recommend a prescription medicine to help prevent HIV infection. If you choose to take medicine to prevent HIV, you should first get tested for HIV. You should then be tested every 3 months for as long as you are taking the medicine. Pregnancy  If you are about to stop having your period (premenopausal) and  you may become pregnant, seek counseling before you get pregnant.  Take 400 to 800 micrograms (mcg) of folic acid every day if you become pregnant.  Ask for birth control (contraception) if you want to prevent pregnancy. Osteoporosis and menopause Osteoporosis is a disease in which the bones lose minerals and strength with aging. This can result in bone fractures. If you are 65 years old or older, or if you are at risk for osteoporosis and fractures, ask your health care provider if you should:  Be screened for bone loss.  Take a calcium or vitamin D supplement to lower your risk of fractures.  Be given hormone replacement therapy (HRT) to treat symptoms of menopause. Follow these instructions at home: Lifestyle  Do not use any products that contain nicotine or tobacco, such as cigarettes, e-cigarettes, and chewing tobacco. If you need help quitting, ask your health care provider.  Do not use street drugs.  Do not share needles.  Ask your health care provider for help if you need support or information about quitting drugs. Alcohol use  Do not drink alcohol if: ? Your health care provider tells you not to drink. ? You are pregnant, may be pregnant, or are planning to become pregnant.  If you drink alcohol: ? Limit how much you use to 0-1 drink a day. ? Limit intake if you are breastfeeding.  Be aware of how much alcohol is in your drink. In the [place] one drink equals one 12 oz bottle of beer (355 mL), one 5 oz glass of wine (148 mL), or one 1 oz glass of hard liquor (44 mL). General instructions  Schedule regular health, dental, and eye exams.  Stay current with your vaccines.  Tell your health care provider if: ? You often feel depressed. ? You have ever been abused or do not feel safe at home. Summary  Adopting a healthy lifestyle and getting preventive care are important in promoting health and wellness.  Follow your health care provider's instructions about healthy  diet, exercising, and getting tested or screened for diseases.  Follow your health care provider's instructions on monitoring your cholesterol and blood pressure. This information is not intended to replace advice given to you by your health care provider. Make sure you discuss any questions you have with your health care provider. Document Released: 11/23/2010 Document Revised: 05/03/2018 Document Reviewed: 05/03/2018 Elsevier Patient Education  2020 [employer]  

## 2019-04-24 NOTE — Progress Notes (Signed)
Subjective:     Tonya Clark is a 23 y.o. female and is here for a comprehensive physical exam. The patient reports problems - Still having frequent migraine headaches.  Almost daily.  She says the one yesterday was so severe that she was most in tears and it lasted for about 4 hours.  She has been trying to really avoid over-the-counter pain medicines..  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  She is not currently exercising.  Feels like her mood and sleep are okay right now.  She has not gone for her blood work for her medications yet.  Social History   Socioeconomic History  . Marital status: Single    Spouse name: Not on file  . Number of children: Not on file  . Years of education: Not on file  . Highest education level: Not on file  Occupational History  . Occupation: Ship broker  Social Needs  . Financial resource strain: Not on file  . Food insecurity    Worry: Not on file    Inability: Not on file  . Transportation needs    Medical: Not on file    Non-medical: Not on file  Tobacco Use  . Smoking status: Never Smoker  . Smokeless tobacco: Never Used  Substance and Sexual Activity  . Alcohol use: No  . Drug use: No  . Sexual activity: Not on file  Lifestyle  . Physical activity    Days per week: Not on file    Minutes per session: Not on file  . Stress: Not on file  Relationships  . Social Herbalist on phone: Not on file    Gets together: Not on file    Attends religious service: Not on file    Active member of club or organization: Not on file    Attends meetings of clubs or organizations: Not on file    Relationship status: Not on file  . Intimate partner violence    Fear of current or ex partner: Not on file   Emotionally abused: Not on file    Physically abused: Not on file    Forced sexual activity: Not on file  Other Topics Concern  . Not on file  Social History Narrative   Was home schooled for HS. She is now working at a [employer] 2-3 times per week. Born in [place].    Health Maintenance  Topic Date Due  . HIV Screening  06/09/2011  . PAP-Cervical Cytology Screening  06/08/2017  . PAP SMEAR-Modifier  06/08/2017  . TETANUS/TDAP  11/24/2026  . INFLUENZA VACCINE  Completed    The following portions of the patient's history were reviewed and updated as appropriate: allergies, current medications, past family history, past medical history, past social history, past surgical history and problem list.  Review of Systems A comprehensive review of systems was negative.   Objective:    Ht 5\' 6"  (1.676 m)   BMI 42.93 kg/m  General appearance: alert, cooperative and appears stated age Head: Normocephalic, without obvious abnormality, atraumatic Eyes: conjc clear, EOMI, PEERLA Ears: normal TM's and external ear canals both ears Nose: Nares normal. Septum midline. Mucosa normal. No drainage or sinus tenderness. Throat: lips,  mucosa, and tongue normal; teeth and gums normal Neck: no adenopathy, no carotid bruit, no JVD, supple, symmetrical, trachea midline and thyroid not enlarged, symmetric, no tenderness/mass/nodules Back: symmetric, no curvature. ROM normal. No CVA tenderness. Lungs: clear to auscultation bilaterally Heart: regular rate and rhythm, S1, S2 normal, no murmur, click, rub or gallop Abdomen: soft, non-tender; bowel sounds normal; no masses,  no organomegaly Extremities: extremities normal, atraumatic, no cyanosis or edema Pulses: 2+ and symmetric Skin: Skin color, texture, turgor normal. No rashes or lesions Lymph nodes: Cervical, supraclavicular, and axillary nodes normal. Neurologic: Alert and oriented X 3, normal strength and tone. Normal symmetric reflexes.  Normal coordination and gait    Assessment:    Healthy female exam.     Plan:     See After Visit Summary for Counseling Recommendations   Keep up a regular exercise program and make sure you are eating a healthy diet Try to eat 4 servings of dairy a day, or if you are lactose intolerant take a calcium with vitamin D daily.  Your vaccines are up to date.  We did discuss need to get a Pap smear done.  She has been sexually active in the past.  She said she was on her period today and will try to reschedule for the spring.  Migraine headaches-well controlled at all.  In fact we will go ahead and taper off the Topamax.  We will continue with the metoprolol which she takes a low-dose of also for palpitations and PVCs.  We will switch her to Ajovy which looks like it is covered by her insurance.  It is a monthly injection and I like to see her back in 2 months to make sure that she is tolerating this well.  Also we will send over prescription for generic Imitrex to try.  Cannot take more than 10 tabs in a month.  Try to use sparingly but we can only see if she feels like it is helpful.  Physical form completed for work.  She actually had her TB skin test placed yesterday and will come back to have that read tomorrow.

## 2019-04-25 ENCOUNTER — Ambulatory Visit (INDEPENDENT_AMBULATORY_CARE_PROVIDER_SITE_OTHER): Payer: BC Managed Care – PPO | Admitting: Physician Assistant

## 2019-04-25 ENCOUNTER — Telehealth: Payer: Self-pay | Admitting: Family Medicine

## 2019-04-25 ENCOUNTER — Other Ambulatory Visit: Payer: Self-pay

## 2019-04-25 DIAGNOSIS — Z111 Encounter for screening for respiratory tuberculosis: Secondary | ICD-10-CM

## 2019-04-25 LAB — TB SKIN TEST
Induration: 0 mm
TB Skin Test: NEGATIVE

## 2019-04-25 NOTE — Telephone Encounter (Signed)
Received prior authorization on Ajovy sent through cover my meds waiting on determination. - CF

## 2019-04-25 NOTE — Progress Notes (Signed)
Pt in office today to have TB skin test read. 0 mm induration. Pts test is negative. Forms completed, copy given to pt and copy placed to be scanned.

## 2019-04-25 NOTE — Progress Notes (Signed)
Patient ID: Tonya Clark, female   DOB: Oct 15, 1995, 23 y.o.   MRN: [id number] Agree with plan.

## 2019-04-26 ENCOUNTER — Ambulatory Visit: Payer: BC Managed Care – PPO

## 2019-04-27 ENCOUNTER — Encounter: Payer: Self-pay | Admitting: Family Medicine

## 2019-04-27 NOTE — Telephone Encounter (Signed)
Received fax and Arie Sabina has been approved.   Valid: 04/25/19 - 07/24/19  I will notify pharmacy - CF

## 2019-05-10 ENCOUNTER — Other Ambulatory Visit: Payer: Self-pay | Admitting: Family Medicine

## 2019-05-16 ENCOUNTER — Other Ambulatory Visit: Payer: Self-pay | Admitting: Family Medicine

## 2019-06-13 DIAGNOSIS — F3162 Bipolar disorder, current episode mixed, moderate: Secondary | ICD-10-CM | POA: Diagnosis not present

## 2019-06-26 ENCOUNTER — Ambulatory Visit (INDEPENDENT_AMBULATORY_CARE_PROVIDER_SITE_OTHER): Payer: BC Managed Care – PPO | Admitting: Family Medicine

## 2019-06-26 ENCOUNTER — Encounter: Payer: Self-pay | Admitting: Family Medicine

## 2019-06-26 VITALS — Temp 96.5°F

## 2019-06-26 DIAGNOSIS — G43009 Migraine without aura, not intractable, without status migrainosus: Secondary | ICD-10-CM | POA: Diagnosis not present

## 2019-06-26 MED ORDER — ZOLMITRIPTAN 2.5 MG PO TABS: 2.5000 mg | ORAL_TABLET | Freq: Once | ORAL | 0 refills | Status: DC

## 2019-06-26 NOTE — Progress Notes (Signed)
Virtual Visit via Video Note  I connected with Tonya Clark on 06/26/19 at  4:00 PM EST by a video enabled telemedicine application and verified that I am speaking with the correct person using two identifiers.   I discussed the limitations of evaluation and management by telemedicine and the availability of in person appointments. The patient expressed understanding and agreed to proceed.  Subjective:    CC: Follow-up new start Ajovy for migraine headaches.  HPI: Migraine HA  -she is following up for new start of Ajovy.  She says so far she has been tolerating it well without any side effects.  She does get a burning sensation when she injects it but says she is gotten significant improvement in her headaches.  Over the last 30 days she has not had a single migraine and is only had 4 headaches.  She has tried Imitrex a couple times but says that it only works about half of the time.  She is in the classroom teaching and doing well.   Past medical history, Surgical history, Family history not pertinant except as noted below, Social history, Allergies, and medications have been entered into the medical record, reviewed, and corrections made.   Review of Systems: No fevers, chills, night sweats, weight loss, chest pain, or shortness of breath.   Objective:    General: Speaking clearly in complete sentences without any shortness of breath.  Alert and oriented x3.  Normal judgment. No apparent acute distress.    Impression and Recommendations:    Migraines headaches-doing really well on Ajovy thus far she is 2 months then.  We will continue current regimen for now.  Just encouraged her to be on the look out for other factors that can increase headaches such as increased stress levels poor sleep quality and lifestyle.  Encouraged her to continue to work on those things.  We will discontinue Imitrex since it does not seem very effective and will try Zomig instead.  New prescription sent to  pharmacy plan to follow-up in 4 months.   I discussed the assessment and treatment plan with the patient. The patient was provided an opportunity to ask questions and all were answered. The patient agreed with the plan and demonstrated an understanding of the instructions.   The patient was advised to call back or seek an in-person evaluation if the symptoms worsen or if the condition fails to improve as anticipated.   Nani Gasser, MD

## 2019-06-26 NOTE — Progress Notes (Signed)
Pt reports that her migraines have not been bad at all.   Doing well on current regimen.

## 2019-07-02 ENCOUNTER — Encounter: Payer: Self-pay | Admitting: Family Medicine

## 2019-07-05 MED ORDER — DOXYCYCLINE HYCLATE 100 MG PO TABS: 100.0000 mg | ORAL_TABLET | Freq: Two times a day (BID) | ORAL | 0 refills | Status: DC

## 2019-07-25 DIAGNOSIS — Z5181 Encounter for therapeutic drug level monitoring: Secondary | ICD-10-CM | POA: Diagnosis not present

## 2019-07-26 DIAGNOSIS — Z23 Encounter for immunization: Secondary | ICD-10-CM | POA: Diagnosis not present

## 2019-07-31 ENCOUNTER — Telehealth: Payer: Self-pay | Admitting: Family Medicine

## 2019-07-31 NOTE — Telephone Encounter (Signed)
Received fax for PA on Ajovy sent through cover my meds waiting on determination. - CF 

## 2019-08-06 NOTE — Telephone Encounter (Signed)
Received fax from [facility] and they approved coverage on Ajovy.   Valid: 07/31/2019 - 10/31/2019 - CF

## 2019-08-11 ENCOUNTER — Other Ambulatory Visit: Payer: Self-pay | Admitting: Family Medicine

## 2019-08-20 ENCOUNTER — Other Ambulatory Visit: Payer: Self-pay | Admitting: Family Medicine

## 2019-08-20 DIAGNOSIS — G43009 Migraine without aura, not intractable, without status migrainosus: Secondary | ICD-10-CM

## 2019-08-28 ENCOUNTER — Telehealth: Payer: Self-pay

## 2019-08-28 NOTE — Telephone Encounter (Signed)
Patient called stating that she had a reaction to the first COVID vaccine where he throat swelled "just a little bit". Patient called me from location of vaccine to get OK to get second dose- I advised patient it was NOT recommended that she get second dose.   Patient was not happy, but agreeable. Patient wants to know if there is anything else she should be doing.

## 2019-08-29 ENCOUNTER — Encounter: Payer: Self-pay | Admitting: Family Medicine

## 2019-08-29 NOTE — Telephone Encounter (Signed)
Dd she have a rash or fever or other symptoms?  Continue to wash hands and wear mask to be safe.

## 2019-08-30 NOTE — Telephone Encounter (Signed)
Discussed with pt via MyChart.

## 2019-09-26 DIAGNOSIS — F3162 Bipolar disorder, current episode mixed, moderate: Secondary | ICD-10-CM | POA: Diagnosis not present

## 2019-09-28 ENCOUNTER — Telehealth: Payer: BC Managed Care – PPO | Admitting: Family

## 2019-09-28 DIAGNOSIS — A084 Viral intestinal infection, unspecified: Secondary | ICD-10-CM

## 2019-09-28 MED ORDER — ONDANSETRON HCL 4 MG PO TABS: 4.0000 mg | ORAL_TABLET | Freq: Three times a day (TID) | ORAL | 0 refills | Status: DC | PRN

## 2019-09-28 NOTE — Progress Notes (Signed)
We are sorry that you are not feeling well. Here is how we plan to help!  Based on what you have shared with me it looks like you have a Virus that is irritating your GI tract.  Vomiting is the forceful emptying of a portion of the stomach's content through the mouth.  Although nausea and vomiting can make you feel miserable, it's important to remember that these are not diseases, but rather symptoms of an underlying illness.  When we treat short term symptoms, we always caution that any symptoms that persist should be fully evaluated in a medical office.  I have prescribed a medication that will help alleviate your symptoms and allow you to stay hydrated:  Zofran 4 mg 1 tablet every 8 hours as needed for nausea and vomiting. I have also sent a work note to Pharmacologist.   HOME CARE:  Drink clear liquids.  This is very important! Dehydration (the lack of fluid) can lead to a serious complication.  Start off with 1 tablespoon every 5 minutes for 8 hours.  You may begin eating bland foods after 8 hours without vomiting.  Start with saltine crackers, white bread, rice, mashed potatoes, applesauce.  After 48 hours on a bland diet, you may resume a normal diet.  Try to go to sleep.  Sleep often empties the stomach and relieves the need to vomit.  GET HELP RIGHT AWAY IF:   Your symptoms do not improve or worsen within 2 days after treatment.  You have a fever for over 3 days.  You cannot keep down fluids after trying the medication.  MAKE SURE YOU:   Understand these instructions.  Will watch your condition.  Will get help right away if you are not doing well or get worse.   Thank you for choosing an e-visit. Your e-visit answers were reviewed by a board certified advanced clinical practitioner to complete your personal care plan. Depending upon the condition, your plan could have included both over the counter or prescription medications. Please review your pharmacy choice. Be sure  that the pharmacy you have chosen is open so that you can pick up your prescription now.  If there is a problem you may message your provider in MyChart to have the prescription routed to another pharmacy. Your safety is important to [place]. If you have drug allergies check your prescription carefully.  For the next 24 hours, you can use MyChart to ask questions about today's visit, request a non-urgent call back, or ask for a work or school excuse from your e-visit provider. You will get an e-mail in the next two days asking about your experience. I hope that your e-visit has been valuable and will speed your recovery.  Approximately 5 minutes was spent documenting and reviewing patient's chart.

## 2019-10-17 ENCOUNTER — Encounter: Payer: Self-pay | Admitting: Family Medicine

## 2019-10-17 ENCOUNTER — Telehealth (INDEPENDENT_AMBULATORY_CARE_PROVIDER_SITE_OTHER): Payer: Self-pay | Admitting: Family Medicine

## 2019-10-17 VITALS — Ht 66.0 in

## 2019-10-17 DIAGNOSIS — Z5329 Procedure and treatment not carried out because of patient's decision for other reasons: Secondary | ICD-10-CM

## 2019-10-17 DIAGNOSIS — Z91199 Patient's noncompliance with other medical treatment and regimen due to unspecified reason: Secondary | ICD-10-CM

## 2019-10-17 NOTE — Progress Notes (Signed)
\  Also send text twice with no answer. Attempted to call the following day.

## 2019-10-17 NOTE — Progress Notes (Signed)
Tried pt 2x and LVM advising her on the second VM to sign in for her virtual visit today.

## 2019-11-01 ENCOUNTER — Telehealth: Payer: Self-pay | Admitting: Family Medicine

## 2019-11-01 NOTE — Telephone Encounter (Signed)
Received fax for PA on ajovy sent through cover my meds waiting on determination. - CF

## 2019-11-02 NOTE — Telephone Encounter (Signed)
Received fax from [facility] and they denied coverage on Ajovy placing in providers box for review. - CF

## 2019-11-13 ENCOUNTER — Ambulatory Visit (INDEPENDENT_AMBULATORY_CARE_PROVIDER_SITE_OTHER): Payer: BC Managed Care – PPO | Admitting: Family Medicine

## 2019-11-13 ENCOUNTER — Telehealth: Payer: Self-pay | Admitting: Family Medicine

## 2019-11-13 ENCOUNTER — Encounter: Payer: Self-pay | Admitting: Family Medicine

## 2019-11-13 ENCOUNTER — Other Ambulatory Visit (HOSPITAL_COMMUNITY)
Admission: RE | Admit: 2019-11-13 | Discharge: 2019-11-13 | Disposition: A | Discharge status: Home / Self Care | Payer: BC Managed Care – PPO | Source: Ambulatory Visit | Attending: Family Medicine | Admitting: Family Medicine

## 2019-11-13 VITALS — BP 139/86 | HR 110 | Ht 66.0 in | Wt 303.0 lb

## 2019-11-13 DIAGNOSIS — Z124 Encounter for screening for malignant neoplasm of cervix: Secondary | ICD-10-CM

## 2019-11-13 DIAGNOSIS — I493 Ventricular premature depolarization: Secondary | ICD-10-CM | POA: Diagnosis not present

## 2019-11-13 DIAGNOSIS — Z6841 Body Mass Index (BMI) 40.0 and over, adult: Secondary | ICD-10-CM | POA: Insufficient documentation

## 2019-11-13 DIAGNOSIS — I491 Atrial premature depolarization: Secondary | ICD-10-CM | POA: Diagnosis not present

## 2019-11-13 DIAGNOSIS — Z0000 Encounter for general adult medical examination without abnormal findings: Secondary | ICD-10-CM

## 2019-11-13 LAB — POCT GLYCOSYLATED HEMOGLOBIN (HGB A1C): Hemoglobin A1C: 5.6 % (ref 4.0–5.6)

## 2019-11-13 MED ORDER — NORETHINDRONE ACET-ETHINYL EST 1.5-30 MG-MCG PO TABS: 1.0000 | ORAL_TABLET | Freq: Every day | ORAL | 3 refills | Status: DC

## 2019-11-13 MED ORDER — METOPROLOL SUCCINATE ER 25 MG PO TB24: 25.0000 mg | ORAL_TABLET | Freq: Every day | ORAL | 1 refills | Status: DC

## 2019-11-13 NOTE — Progress Notes (Signed)
Established Patient Office Visit - CPE  Subjective:  Patient ID: Tonya Clark, female    DOB: Feb 12, 1996  Age: 24 y.o. MRN: [id number]  CC:  Chief Complaint  Patient presents with  . Annual Exam    HPI Tonya Clark presents for CPE.  She is doing well but frustrated with weight gain.  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  She is not currently exercising.  She is also on mood stabilizers and feels like her mood is in a good place right now.  She is actually getting married in 2 weeks.  She is happy with her current birth control regimen.  He has never had a Pap smear performed.    Past Medical History:  Diagnosis Date  . Arrhythmia   . Bipolar 1 disorder (HCC) 08/21/2013  . Jaundice, neonatal   . Kidney stone   . Premature birth    37 weeks    Past Surgical History:  Procedure Laterality Date  . TONSILLECTOMY  2009    Family History  Problem Relation Age of Onset  . Hypertension Mother   . Allergies Mother   . Anxiety disorder Father   . Hypertension Father     Social History   Socioeconomic History  . Marital status: Single    Spouse name: Not on file  . Number of children: Not on file  . Years of education: Not on file  . Highest education level: Not on file  Occupational History  . Occupation: Consulting civil engineer  Tobacco Use  . Smoking status: Never Smoker  . Smokeless tobacco: Never Used  Vaping Use  . Vaping Use: Never used  Substance and Sexual Activity  . Alcohol use: No  . Drug use: No  . Sexual activity: Not on file  Other Topics Concern  . Not on file  Social History Narrative   Was home schooled for HS. She is now working at a [employer] 2-3 times per week. Born in [place].    Social Determinants of Health   Financial Resource Strain:   . Difficulty of Paying Living Expenses:   Food Insecurity:   . Worried About Programme researcher, broadcasting/film/video in the Last Year:   . Barista in the Last Year:   Transportation  Needs:   . Freight forwarder (Medical):   Marland Kitchen Lack of Transportation (Non-Medical):   Physical Activity:   . Days of Exercise per Week:   . Minutes of Exercise per Session:   Stress:   . Feeling of Stress :   Social Connections:   . Frequency of Communication with Friends and Family:   . Frequency of Social Gatherings with Friends and Family:   . Attends Religious Services:   . Active Member of Clubs or Organizations:   . Attends Banker Meetings:   Marland Kitchen Marital Status:   Intimate Partner Violence:   . Fear of Current or Ex-Partner:   . Emotionally Abused:   Marland Kitchen Physically Abused:   . Sexually Abused:     Outpatient Medications Prior to Visit  Medication Sig Dispense Refill  . buPROPion (WELLBUTRIN XL) 150 MG 24 hr tablet     . hydrOXYzine (VISTARIL) 25 MG capsule 1 capsule.    Marland Kitchen LATUDA 80 MG TABS tablet Take 80 mg by mouth daily.    . OXcarbazepine (TRILEPTAL) 150 MG tablet Take 1 tablet (150 mg total) by mouth 2 (two) times daily. 60 tablet 0  .  QUEtiapine (SEROQUEL) 50 MG tablet Take 50 mg by mouth at bedtime as needed.    . AJOVY 225 MG/1.5ML SOAJ INJECT 1 PEN INTO THE SKIN EVERY 30 DAYS 1.5 mL 2  . metoprolol succinate (TOPROL-XL) 25 MG 24 hr tablet Take 1 tablet (25 mg total) by mouth daily. 90 tablet 1  . buPROPion (WELLBUTRIN XL) 300 MG 24 hr tablet Take 300 mg by mouth daily.    Marland Kitchen doxycycline (VIBRA-TABS) 100 MG tablet Take 1 tablet (100 mg total) by mouth 2 (two) times daily. 20 tablet 0  . escitalopram (LEXAPRO) 5 MG tablet Take 5 mg by mouth daily.    Lenda Kelp 1.5/30 1.5-30 MG-MCG tablet TAKE 1 TABLET BY MOUTH EVERY DAY 84 tablet 3  . ondansetron (ZOFRAN) 4 MG tablet Take 1 tablet (4 mg total) by mouth every 8 (eight) hours as needed for nausea or vomiting. 20 tablet 0  . temazepam (RESTORIL) 15 MG capsule     . ZOLMitriptan (ZOMIG) 2.5 MG tablet Take 1 tablet (2.5 mg total) by mouth once for 1 dose. May repeat in 2 hours if headache persists or recurs. 10  tablet 0   No facility-administered medications prior to visit.    Allergies  Allergen Reactions  . Lamictal [Lamotrigine] Rash    ROS Review of Systems    Objective:    Physical Exam Constitutional:      Appearance: She is well-developed.  HENT:     Head: Normocephalic and atraumatic.     Right Ear: External ear normal.     Left Ear: External ear normal.     Nose: Nose normal.  Eyes:     Conjunctiva/sclera: Conjunctivae normal.     Pupils: Pupils are equal, round, and reactive to light.  Neck:     Thyroid: No thyromegaly.  Cardiovascular:     Rate and Rhythm: Normal rate and regular rhythm.     Heart sounds: Normal heart sounds.  Pulmonary:     Effort: Pulmonary effort is normal.     Breath sounds: Normal breath sounds. No wheezing.  Chest:     Chest wall: No mass.     Breasts:        Right: Normal. No swelling or mass.        Left: Normal. No swelling or mass.  Abdominal:     Palpations: Abdomen is soft.     Tenderness: There is no abdominal tenderness.  Genitourinary:    General: Normal vulva.     Labia:        Right: No rash.        Left: No rash.      Vagina: Normal.     Cervix: Normal.     Uterus: Normal.      Adnexa: Right adnexa normal and left adnexa normal.     Rectum: Normal.  Musculoskeletal:     Cervical back: Neck supple.  Lymphadenopathy:     Cervical: No cervical adenopathy.  Skin:    General: Skin is warm and dry.  Neurological:     General: No focal deficit present.     Mental Status: She is alert and oriented to person, place, and time.  Psychiatric:        Mood and Affect: Mood normal.        Behavior: Behavior normal.     BP 139/86   Pulse (!) 110   Ht 5\' 6"  (1.676 m)   Wt (!) 303 lb (137.4 kg)   LMP 10/03/2019 (  Approximate)   SpO2 100%   BMI 48.91 kg/m  Wt Readings from Last 3 Encounters:  11/13/19 (!) 303 lb (137.4 kg)  04/23/19 266 lb (120.7 kg)  09/19/18 272 lb (123.4 kg)     Health Maintenance Due  Topic  Date Due  . Hepatitis C Screening  Never done    There are no preventive care reminders to display for this patient.  Lab Results  Component Value Date   TSH 1.03 09/05/2017   Lab Results  Component Value Date   WBC 14.1 (H) 05/30/2017   HGB 12.4 05/30/2017   HCT 39.5 05/30/2017   MCV 74.4 (L) 05/30/2017   PLT 322 05/30/2017   Lab Results  Component Value Date   NA 137 05/30/2017   K 4.0 05/30/2017   CO2 22 05/30/2017   GLUCOSE 87 05/30/2017   BUN 9 05/30/2017   CREATININE 0.82 05/30/2017   BILITOT 0.7 05/30/2017   ALKPHOS 68 05/30/2017   AST 20 05/30/2017   ALT 16 05/30/2017   PROT 7.9 05/30/2017   ALBUMIN 4.3 05/30/2017   CALCIUM 9.5 05/30/2017   ANIONGAP 12 05/30/2017   Lab Results  Component Value Date   CHOL 148 09/05/2017   Lab Results  Component Value Date   HDL 32 (A) 09/05/2017   Lab Results  Component Value Date   LDLCALC 95 09/05/2017   Lab Results  Component Value Date   TRIG 105 09/05/2017   No results found for: CHOLHDL Lab Results  Component Value Date   HGBA1C 5.6 11/13/2019      Assessment & Plan:   Problem List Items Addressed This Visit      Cardiovascular and Mediastinum   PVC (premature ventricular contraction)   Relevant Medications   metoprolol succinate (TOPROL-XL) 25 MG 24 hr tablet   PAC (premature atrial contraction)   Relevant Medications   metoprolol succinate (TOPROL-XL) 25 MG 24 hr tablet     Other   Obesity, Class III, BMI 40-49.9 (morbid obesity) (HCC)    Also discussed treatment options a think she actually be a great candidate for a comprehensive program.  But unfortunately I do not think it will be available in her area so we discussed possibly seeing if her insurance would cover some of the new to her prescription weight loss medications and working on setting dietary and exercise goals.       Other Visit Diagnoses    Routine general medical examination at a health care facility    -  Primary   Relevant  Orders   POCT glycosylated hemoglobin (Hb A1C) (Completed)   Screening for cervical cancer       Relevant Orders   Cytology - PAP (Completed)      Keep up a regular exercise program and make sure you are eating a healthy diet Try to eat 4 servings of dairy a day, or if you are lactose intolerant take a calcium with vitamin D daily.  Your vaccines are up to date.    Meds ordered this encounter  Medications  . Norethindrone Acetate-Ethinyl Estradiol (JUNEL 1.5/30) 1.5-30 MG-MCG tablet    Sig: Take 1 tablet by mouth daily.    Dispense:  84 tablet    Refill:  3  . metoprolol succinate (TOPROL-XL) 25 MG 24 hr tablet    Sig: Take 1 tablet (25 mg total) by mouth daily.    Dispense:  90 tablet    Refill:  1    Follow-up: No follow-ups on  file.    Beatrice Lecher, MD

## 2019-11-13 NOTE — Telephone Encounter (Signed)
I resubmitted PA through cover my meds and it has been approved - CF  Valid 11/13/2019 - 02/13/2020  I called and let patient know and I am calling pharmacy to let them know. - CF

## 2019-11-13 NOTE — Patient Instructions (Addendum)
Can check with your insurance to see what they would cover any the prescription weight loss medications that are FDA approved.  Examples are Gildardo Griffes, [place], and [id number].        Health Maintenance, Female Adopting a healthy lifestyle and getting preventive care are important in promoting health and wellness. Ask your health care provider about:  The right schedule for you to have regular tests and exams.  Things you can do on your own to prevent diseases and keep yourself healthy. What should I know about diet, weight, and exercise? Eat a healthy diet   Eat a diet that includes plenty of vegetables, fruits, low-fat dairy products, and lean protein.  Do not eat a lot of foods that are high in solid fats, added sugars, or sodium. Maintain a healthy weight Body mass index (BMI) is used to identify weight problems. It estimates body fat based on height and weight. Your health care provider can help determine your BMI and help you achieve or maintain a healthy weight. Get regular exercise Get regular exercise. This is one of the most important things you can do for your health. Most adults should:  Exercise for at least 150 minutes each week. The exercise should increase your heart rate and make you sweat (moderate-intensity exercise).  Do strengthening exercises at least twice a week. This is in addition to the moderate-intensity exercise.  Spend less time sitting. Even light physical activity can be beneficial. Watch cholesterol and blood lipids Have your blood tested for lipids and cholesterol at 23 years of age, then have this test every 5 years. Have your cholesterol levels checked more often if:  Your lipid or cholesterol levels are high.  You are older than 23 years of age.  You are at high risk for heart disease. What should I know about cancer screening? Depending on your health history and family history, you may need to have cancer screening at various ages. This  may include screening for:  Breast cancer.  Cervical cancer.  Colorectal cancer.  Skin cancer.  Lung cancer. What should I know about heart disease, diabetes, and high blood pressure? Blood pressure and heart disease  High blood pressure causes heart disease and increases the risk of stroke. This is more likely to develop in people who have high blood pressure readings, are of African descent, or are overweight.  Have your blood pressure checked: ? Every 3-5 years if you are 48-21 years of age. ? Every year if you are 35 years old or older. Diabetes Have regular diabetes screenings. This checks your fasting blood sugar level. Have the screening done:  Once every three years after age 75 if you are at a normal weight and have a low risk for diabetes.  More often and at a younger age if you are overweight or have a high risk for diabetes. What should I know about preventing infection? Hepatitis B If you have a higher risk for hepatitis B, you should be screened for this virus. Talk with your health care provider to find out if you are at risk for hepatitis B infection. Hepatitis C Testing is recommended for:  Everyone born from [birth year] through 1964.  Anyone with known risk factors for hepatitis C. Sexually transmitted infections (STIs)  Get screened for STIs, including gonorrhea and chlamydia, if: ? You are sexually active and are younger than 23 years of age. ? You are older than 23 years of age and your health care provider tells you that  you are at risk for this type of infection. ? Your sexual activity has changed since you were last screened, and you are at increased risk for chlamydia or gonorrhea. Ask your health care provider if you are at risk.  Ask your health care provider about whether you are at high risk for HIV. Your health care provider may recommend a prescription medicine to help prevent HIV infection. If you choose to take medicine to prevent HIV, you should  first get tested for HIV. You should then be tested every 3 months for as long as you are taking the medicine. Pregnancy  If you are about to stop having your period (premenopausal) and you may become pregnant, seek counseling before you get pregnant.  Take 400 to 800 micrograms (mcg) of folic acid every day if you become pregnant.  Ask for birth control (contraception) if you want to prevent pregnancy. Osteoporosis and menopause Osteoporosis is a disease in which the bones lose minerals and strength with aging. This can result in bone fractures. If you are 53 years old or older, or if you are at risk for osteoporosis and fractures, ask your health care provider if you should:  Be screened for bone loss.  Take a calcium or vitamin D supplement to lower your risk of fractures.  Be given hormone replacement therapy (HRT) to treat symptoms of menopause. Follow these instructions at home: Lifestyle  Do not use any products that contain nicotine or tobacco, such as cigarettes, e-cigarettes, and chewing tobacco. If you need help quitting, ask your health care provider.  Do not use street drugs.  Do not share needles.  Ask your health care provider for help if you need support or information about quitting drugs. Alcohol use  Do not drink alcohol if: ? Your health care provider tells you not to drink. ? You are pregnant, may be pregnant, or are planning to become pregnant.  If you drink alcohol: ? Limit how much you use to 0-1 drink a day. ? Limit intake if you are breastfeeding.  Be aware of how much alcohol is in your drink. In the [place] one drink equals one 12 oz bottle of beer (355 mL), one 5 oz glass of wine (148 mL), or one 1 oz glass of hard liquor (44 mL). General instructions  Schedule regular health, dental, and eye exams.  Stay current with your vaccines.  Tell your health care provider if: ? You often feel depressed. ? You have ever been abused or do not feel safe  at home. Summary  Adopting a healthy lifestyle and getting preventive care are important in promoting health and wellness.  Follow your health care provider's instructions about healthy diet, exercising, and getting tested or screened for diseases.  Follow your health care provider's instructions on monitoring your cholesterol and blood pressure. This information is not intended to replace advice given to you by your health care provider. Make sure you discuss any questions you have with your health care provider. Document Revised: 05/03/2018 Document Reviewed: 05/03/2018 Elsevier Patient Education  2020 [employer].

## 2019-11-13 NOTE — Assessment & Plan Note (Signed)
Also discussed treatment options a think she actually be a great candidate for a comprehensive program.  But unfortunately I do not think it will be available in her area so we discussed possibly seeing if her insurance would cover some of the new to her prescription weight loss medications and working on setting dietary and exercise goals.

## 2019-11-13 NOTE — Telephone Encounter (Signed)
Call pt: let her know that Dr. Sunnie Nielsen can remover her Nexplanon in her arm this summer if she would like. We do need to have it removed.

## 2019-11-14 ENCOUNTER — Other Ambulatory Visit: Payer: Self-pay

## 2019-11-14 DIAGNOSIS — G43009 Migraine without aura, not intractable, without status migrainosus: Secondary | ICD-10-CM

## 2019-11-14 LAB — CYTOLOGY - PAP
Adequacy: ABSENT
Chlamydia: NEGATIVE
Comment: NEGATIVE
Comment: NEGATIVE
Comment: NEGATIVE
Comment: NORMAL
Diagnosis: NEGATIVE
High risk HPV: NEGATIVE
Neisseria Gonorrhea: NEGATIVE
Trichomonas: NEGATIVE

## 2019-11-14 MED ORDER — SAXENDA 18 MG/3ML ~~LOC~~ SOPN: 0.6000 mg | PEN_INJECTOR | Freq: Every day | SUBCUTANEOUS | 0 refills | Status: DC

## 2019-11-14 MED ORDER — AJOVY 225 MG/1.5ML ~~LOC~~ SOAJ: SUBCUTANEOUS | 2 refills | Status: DC

## 2019-11-14 NOTE — Telephone Encounter (Signed)
Left message advising of recommendations.  

## 2019-11-14 NOTE — Telephone Encounter (Signed)
Pt advised and will call to schedule.  

## 2019-11-19 ENCOUNTER — Telehealth: Payer: Self-pay | Admitting: Family Medicine

## 2019-11-19 NOTE — Telephone Encounter (Signed)
Received fax for PA Saxenda sent through cover my meds waiting on determination.   Received fax from [facility] and they approved coverage on Saxenda from 11/19/19 - 03/20/20. - CF

## 2019-11-23 MED ORDER — INSULIN PEN NEEDLE 31G X 8 MM MISC: 99 refills | Status: DC

## 2019-11-23 NOTE — Addendum Note (Signed)
Addended by: Chalmers Cater on: 11/23/2019 08:06 AM   Modules accepted: Orders

## 2019-12-14 ENCOUNTER — Other Ambulatory Visit: Payer: Self-pay | Admitting: Family Medicine

## 2019-12-14 NOTE — Telephone Encounter (Signed)
Pt advise of appointment required for refill. Scheduled w/Joy next week

## 2019-12-14 NOTE — Telephone Encounter (Signed)
Yes, she needs an appointment before can be refilled.  If I do not have any availability we can get her in with Di Kindle or Joy to do the 1 month follow-up.

## 2019-12-21 ENCOUNTER — Ambulatory Visit: Payer: BC Managed Care – PPO | Admitting: Medical-Surgical

## 2019-12-21 DIAGNOSIS — R635 Abnormal weight gain: Secondary | ICD-10-CM | POA: Diagnosis not present

## 2019-12-21 DIAGNOSIS — Z13220 Encounter for screening for lipoid disorders: Secondary | ICD-10-CM | POA: Diagnosis not present

## 2019-12-21 DIAGNOSIS — F39 Unspecified mood [affective] disorder: Secondary | ICD-10-CM | POA: Diagnosis not present

## 2019-12-28 ENCOUNTER — Encounter: Payer: Self-pay | Admitting: Family Medicine

## 2019-12-31 DIAGNOSIS — Z713 Dietary counseling and surveillance: Secondary | ICD-10-CM | POA: Diagnosis not present

## 2019-12-31 MED ORDER — FLUCONAZOLE 150 MG PO TABS
150.0000 mg | ORAL_TABLET | Freq: Once | ORAL | 0 refills | Status: AC
Start: 2019-12-31 — End: 2019-12-31

## 2020-01-15 DIAGNOSIS — R05 Cough: Secondary | ICD-10-CM | POA: Diagnosis not present

## 2020-01-15 DIAGNOSIS — R109 Unspecified abdominal pain: Secondary | ICD-10-CM | POA: Diagnosis not present

## 2020-01-15 DIAGNOSIS — R112 Nausea with vomiting, unspecified: Secondary | ICD-10-CM | POA: Diagnosis not present

## 2020-01-15 DIAGNOSIS — I880 Nonspecific mesenteric lymphadenitis: Secondary | ICD-10-CM | POA: Diagnosis not present

## 2020-01-15 DIAGNOSIS — J029 Acute pharyngitis, unspecified: Secondary | ICD-10-CM | POA: Diagnosis not present

## 2020-01-15 DIAGNOSIS — Z79899 Other long term (current) drug therapy: Secondary | ICD-10-CM | POA: Diagnosis not present

## 2020-01-15 DIAGNOSIS — R519 Headache, unspecified: Secondary | ICD-10-CM | POA: Diagnosis not present

## 2020-01-15 DIAGNOSIS — K3580 Unspecified acute appendicitis: Secondary | ICD-10-CM | POA: Diagnosis not present

## 2020-01-15 DIAGNOSIS — R1011 Right upper quadrant pain: Secondary | ICD-10-CM | POA: Diagnosis not present

## 2020-01-15 DIAGNOSIS — F319 Bipolar disorder, unspecified: Secondary | ICD-10-CM | POA: Diagnosis not present

## 2020-01-15 DIAGNOSIS — R1031 Right lower quadrant pain: Secondary | ICD-10-CM | POA: Diagnosis not present

## 2020-01-18 ENCOUNTER — Telehealth (INDEPENDENT_AMBULATORY_CARE_PROVIDER_SITE_OTHER): Payer: BC Managed Care – PPO | Admitting: Nurse Practitioner

## 2020-01-18 ENCOUNTER — Encounter: Payer: Self-pay | Admitting: Nurse Practitioner

## 2020-01-18 DIAGNOSIS — I880 Nonspecific mesenteric lymphadenitis: Secondary | ICD-10-CM

## 2020-01-18 MED ORDER — PROMETHAZINE HCL 25 MG PO TABS: 25.0000 mg | ORAL_TABLET | Freq: Four times a day (QID) | ORAL | 0 refills | Status: DC | PRN

## 2020-01-18 MED ORDER — ONDANSETRON HCL 8 MG PO TABS: 8.0000 mg | ORAL_TABLET | Freq: Three times a day (TID) | ORAL | 2 refills | Status: DC | PRN

## 2020-01-18 NOTE — Progress Notes (Signed)
Virtual Video Visit via MyChart Note  I connected with  Tonya Clark on 01/18/20 at 11:20 AM EDT by the video enabled telemedicine application for , MyChart, and verified that I am speaking with the correct person using two identifiers.   I introduced myself as a Publishing rights manager with the practice. We discussed the limitations of evaluation and management by telemedicine and the availability of in person appointments. The patient expressed understanding and agreed to proceed.  The patient is: at home I am: in the office  Subjective:    CC:  Chief Complaint  Patient presents with  . Follow-up    HPI: Tonya Clark is a 24 y.o. y/o female presenting via MyChart today for ER follow-up from this past Tuesday. She was seen at [facility] and sent to [facility] for symptoms of abdominal pain, nausea, vomiting, sore throat, cough, and congestion. She had been exposed to COVID-19, but was tested twice for that and it has been ruled out.   Lab work at the hospital revealed elevated WBC and CT revealed mesenteric adenitis. Strep at the hospital was negative.   She reports they told her she likely had a gastrointestinal illness and possibly cold that caused the symptoms. She was told to follow-up with her PCP if her symptoms were not gone by Thursday.   She reports that her diarrhea has resolved as of yesterday and she is able to eat small amounts, but she is still very nauseated. She also reports her throat is still sore, but that has improved somewhat.  She is able to hold down liquids and reports that she is drinking adequately.   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   Past medical history, Surgical history, Family history not pertinant except as noted below, Social history, Allergies, and medications have been  entered into the medical record, reviewed, and corrections made.   Review of Systems:  See HPI for pertinent positive and negatives  Objective:    General: Speaking clearly in complete sentences without any shortness of breath.   Alert and oriented x3.   Normal judgment.  No apparent acute distress.   Impression and Recommendations:   1. Acute mesenteric adenitis Mesenteric adenitis diagnosis by [facility] emergency room by CT on Tuesday of this week.  Etiology of lymphadenopathy suspected at the time to be from acute gastrointestinal illness.   Testing for COVID-19 and strep throat were both negative.  Unfortunately I do not have access to the emergency room notes or laboratory results to evaluate myself however the patient does report that she had an elevated white blood count at that time.  Given her symptoms of fatigue and sore throat I do have some concerns for possible Epstein-Barr viral infection in the setting of mesenteric lymphadenitis.  Her symptoms do appear to be improving at this time and therefore we will continue with supportive management of symptoms. Discussed with the patient that mesenteric lymphadenitis can take several weeks to completely resolve and supportive care with hydration and rest is important. Will prescribe Zofran for nausea and Phenergan for irretractable vomiting episodes. Recommend she maintains adequate hydration.  BRAT diet may be helpful as she begins to reintroduce solid foods into her diet. Recommend to the patient if her symptoms worsen over the weekend or her fatigue and  sore throat do not improve to let us know.  At that time it may be beneficial to obtain testing for mononucleosis, although the treatment would not vary from current plan.  May also consider antibiotic treatment for gastrointestinal illness if her symptoms persist. Follow-up if symptoms worsen or fail to improve. - ondansetron (ZOFRAN) 8 MG tablet; Take 1 tablet (8 mg total) by  mouth every 8 (eight) hours as needed for nausea or vomiting.  Dispense: 30 tablet; Refill: 2 - promethazine (PHENERGAN) 25 MG tablet; Take 1 tablet (25 mg total) by mouth every 6 (six) hours as needed for nausea or vomiting.  Dispense: 30 tablet; Refill: 0    I discussed the assessment and treatment plan with the patient. The patient was provided an opportunity to ask questions and all were answered. The patient agreed with the plan and demonstrated an understanding of the instructions.   The patient was advised to call back or seek an in-person evaluation if the symptoms worsen or if the condition fails to improve as anticipated.  I provided 20 minutes of non-face-to-face interaction with this MYCHART visit including intake, same-day documentation, and chart review.   Tollie Eth, NP

## 2020-01-18 NOTE — Patient Instructions (Addendum)
Please contact me via MyChart if your symptoms worsen over the weekend.  If you are unable to hold down liquids, please let us know immediately.  I am hopeful that your symptoms will completely resolve in approximately 2 weeks.  If you are still experiencing symptoms beyond this point we may need to consider further testing or possibly treatment with antibiotics for suspected bacterial infection.  Mesenteric Adenitis, Adult  Mesenteric adenitis is inflammation of lymph nodes in the membrane that attaches the intestines to the inside wall of the abdomen (mesentery). Lymph nodes are collections of tissue that filter bacteria, viruses, and waste from the bloodstream. They are located in several different areas of the body and are part of the body's disease-fighting system (immune system). Symptoms of this condition are often similar to inflammation of the appendix (appendicitis). Mesenteric adenitis is a painful condition, but it usually clears up either without treatment or with antibiotic medicine. What are the causes? A viral or bacterial infection is the most common cause of this condition. The infection usually starts somewhere else in the body. What are the signs or symptoms? Symptoms of this condition include:  Abdominal pain and tenderness.  Fever.  Nausea and vomiting.  Diarrhea. How is this diagnosed? This condition may be diagnosed based on:  A physical exam and your medical history.  Blood tests.  Abdominal ultrasound.  Abdominal CT scan. How is this treated? If your condition is caused by a virus, it usually goes away without treatment within 1-2 weeks. If the cause is a bacterial infection, you will be treated with antibiotic medicines. Your health care provider may recommend medicines to help relieve pain or fever. Follow these instructions at home:   Take over-the-counter and prescription medicines only as told by your health care provider.  Do not drive or use  heavy machinery while taking prescription pain medicines.  If you were prescribed an antibiotic medicine, take it as told by your health care provider. Do not stop taking the antibiotic even if you start to feel better.  Get plenty of rest.  Drink enough fluid to keep your urine pale yellow.  Follow instructions from your health care provider about eating or drinking restrictions.  Keep all follow-up visits as told by your health care provider. This is important. Contact a health care provider if:  You have a fever. Get help right away if:  Your pain becomes severe or does not go away.  You vomit repeatedly.  You have severe pain in the lower, right part of your abdomen. This may be appendicitis.  You have bright red or black, tarry stools. Summary  Mesenteric adenitis is inflammation of lymph nodes in the membrane that attaches the intestines to the inside wall of the abdomen (mesentery).  Mesenteric adenitis is a painful condition, but it usually clears up either without treatment or with antibiotic medicine.  A viral or bacterial infection is the most common cause of this condition. This information is not intended to replace advice given to you by your health care provider. Make sure you discuss any questions you have with your health care provider. Document Revised: 01/27/2018 Document Reviewed: 08/23/2016 Elsevier Patient Education  2020 [employer].

## 2020-01-22 DIAGNOSIS — E875 Hyperkalemia: Secondary | ICD-10-CM | POA: Diagnosis not present

## 2020-01-22 DIAGNOSIS — K3580 Unspecified acute appendicitis: Secondary | ICD-10-CM | POA: Diagnosis not present

## 2020-01-22 DIAGNOSIS — R112 Nausea with vomiting, unspecified: Secondary | ICD-10-CM | POA: Diagnosis not present

## 2020-01-22 DIAGNOSIS — R109 Unspecified abdominal pain: Secondary | ICD-10-CM | POA: Diagnosis not present

## 2020-01-22 DIAGNOSIS — F319 Bipolar disorder, unspecified: Secondary | ICD-10-CM | POA: Diagnosis not present

## 2020-01-22 DIAGNOSIS — I880 Nonspecific mesenteric lymphadenitis: Secondary | ICD-10-CM | POA: Diagnosis not present

## 2020-01-22 DIAGNOSIS — R030 Elevated blood-pressure reading, without diagnosis of hypertension: Secondary | ICD-10-CM | POA: Diagnosis not present

## 2020-01-22 DIAGNOSIS — R1031 Right lower quadrant pain: Secondary | ICD-10-CM | POA: Diagnosis not present

## 2020-01-22 DIAGNOSIS — R000 Tachycardia, unspecified: Secondary | ICD-10-CM | POA: Diagnosis not present

## 2020-01-22 DIAGNOSIS — R197 Diarrhea, unspecified: Secondary | ICD-10-CM | POA: Diagnosis not present

## 2020-01-24 ENCOUNTER — Other Ambulatory Visit: Payer: Self-pay | Admitting: Nurse Practitioner

## 2020-01-24 DIAGNOSIS — I880 Nonspecific mesenteric lymphadenitis: Secondary | ICD-10-CM

## 2020-01-24 MED ORDER — ONDANSETRON HCL 8 MG PO TABS: 8.0000 mg | ORAL_TABLET | Freq: Three times a day (TID) | ORAL | 2 refills | Status: DC | PRN

## 2020-01-24 MED ORDER — HYDROCODONE-ACETAMINOPHEN 10-325 MG PO TABS: ORAL_TABLET | ORAL | 0 refills | Status: DC

## 2020-01-24 MED ORDER — PROMETHAZINE HCL 25 MG PO TABS: 25.0000 mg | ORAL_TABLET | Freq: Four times a day (QID) | ORAL | 2 refills | Status: DC | PRN

## 2020-01-29 DIAGNOSIS — R197 Diarrhea, unspecified: Secondary | ICD-10-CM | POA: Diagnosis not present

## 2020-01-29 DIAGNOSIS — K2900 Acute gastritis without bleeding: Secondary | ICD-10-CM | POA: Diagnosis not present

## 2020-01-29 DIAGNOSIS — I880 Nonspecific mesenteric lymphadenitis: Secondary | ICD-10-CM | POA: Diagnosis not present

## 2020-01-29 DIAGNOSIS — R112 Nausea with vomiting, unspecified: Secondary | ICD-10-CM | POA: Diagnosis not present

## 2020-02-03 DIAGNOSIS — R1030 Lower abdominal pain, unspecified: Secondary | ICD-10-CM | POA: Diagnosis not present

## 2020-02-03 DIAGNOSIS — N390 Urinary tract infection, site not specified: Secondary | ICD-10-CM | POA: Diagnosis not present

## 2020-02-03 DIAGNOSIS — R350 Frequency of micturition: Secondary | ICD-10-CM | POA: Diagnosis not present

## 2020-02-05 DIAGNOSIS — R197 Diarrhea, unspecified: Secondary | ICD-10-CM | POA: Diagnosis not present

## 2020-02-05 DIAGNOSIS — K3189 Other diseases of stomach and duodenum: Secondary | ICD-10-CM | POA: Diagnosis not present

## 2020-02-05 DIAGNOSIS — K2100 Gastro-esophageal reflux disease with esophagitis, without bleeding: Secondary | ICD-10-CM | POA: Diagnosis not present

## 2020-02-05 DIAGNOSIS — K319 Disease of stomach and duodenum, unspecified: Secondary | ICD-10-CM | POA: Diagnosis not present

## 2020-02-05 DIAGNOSIS — K449 Diaphragmatic hernia without obstruction or gangrene: Secondary | ICD-10-CM | POA: Diagnosis not present

## 2020-02-15 ENCOUNTER — Telehealth: Payer: Self-pay | Admitting: Family Medicine

## 2020-02-15 NOTE — Telephone Encounter (Signed)
Received fax for renewal PA on Ajovy sent through cover my meds waiting on determination. - CF

## 2020-02-16 ENCOUNTER — Other Ambulatory Visit: Payer: Self-pay | Admitting: Family Medicine

## 2020-02-16 DIAGNOSIS — G43009 Migraine without aura, not intractable, without status migrainosus: Secondary | ICD-10-CM

## 2020-02-18 NOTE — Telephone Encounter (Signed)
Received fax from [facility] they approved Ajovy  Valid 02/14/2020 - 02/13/2021  I will let pharmacy know so they can get medication ready for patient. - CF

## 2020-03-07 DIAGNOSIS — Z309 Encounter for contraceptive management, unspecified: Secondary | ICD-10-CM | POA: Diagnosis not present

## 2020-03-07 DIAGNOSIS — G43009 Migraine without aura, not intractable, without status migrainosus: Secondary | ICD-10-CM | POA: Diagnosis not present

## 2020-03-07 DIAGNOSIS — F3132 Bipolar disorder, current episode depressed, moderate: Secondary | ICD-10-CM | POA: Diagnosis not present

## 2020-03-07 DIAGNOSIS — R002 Palpitations: Secondary | ICD-10-CM | POA: Diagnosis not present

## 2020-03-08 ENCOUNTER — Encounter: Payer: Self-pay | Admitting: Family Medicine

## 2020-03-21 ENCOUNTER — Telehealth: Payer: Self-pay | Admitting: *Deleted

## 2020-03-21 NOTE — Telephone Encounter (Signed)
Prior auth started for continuing Saxenda.

## 2020-03-24 NOTE — Telephone Encounter (Signed)
I am confused. It looks like we sent the script in June with no refills. So has she actually been filling the medication. If so, who is the prescriber?  She hasn't been following we me montly for weight checks, etc.

## 2020-03-27 NOTE — Telephone Encounter (Signed)
Sorry about that!  I went ahead and deleted the PA request from Cover My Meds.  Looks like it was automatically send for renewal.  Please disregard.

## 2020-04-01 DIAGNOSIS — F39 Unspecified mood [affective] disorder: Secondary | ICD-10-CM | POA: Diagnosis not present

## 2020-04-01 DIAGNOSIS — Z6841 Body Mass Index (BMI) 40.0 and over, adult: Secondary | ICD-10-CM | POA: Diagnosis not present

## 2020-04-01 DIAGNOSIS — I493 Ventricular premature depolarization: Secondary | ICD-10-CM | POA: Diagnosis not present

## 2020-04-03 ENCOUNTER — Encounter: Payer: Self-pay | Admitting: Family Medicine

## 2020-04-22 DIAGNOSIS — Z3046 Encounter for surveillance of implantable subdermal contraceptive: Secondary | ICD-10-CM | POA: Diagnosis not present

## 2020-04-29 DIAGNOSIS — J029 Acute pharyngitis, unspecified: Secondary | ICD-10-CM | POA: Diagnosis not present

## 2020-04-29 DIAGNOSIS — Z20822 Contact with and (suspected) exposure to covid-19: Secondary | ICD-10-CM | POA: Diagnosis not present

## 2020-05-04 ENCOUNTER — Other Ambulatory Visit: Payer: Self-pay | Admitting: Family Medicine

## 2020-05-23 ENCOUNTER — Other Ambulatory Visit: Payer: Self-pay | Admitting: Family Medicine

## 2020-05-23 DIAGNOSIS — I493 Ventricular premature depolarization: Secondary | ICD-10-CM

## 2020-05-23 DIAGNOSIS — I491 Atrial premature depolarization: Secondary | ICD-10-CM

## 2020-05-29 DIAGNOSIS — R000 Tachycardia, unspecified: Secondary | ICD-10-CM | POA: Diagnosis not present

## 2020-05-29 DIAGNOSIS — R0789 Other chest pain: Secondary | ICD-10-CM | POA: Diagnosis not present

## 2020-05-29 DIAGNOSIS — R42 Dizziness and giddiness: Secondary | ICD-10-CM | POA: Diagnosis not present

## 2020-05-29 DIAGNOSIS — R059 Cough, unspecified: Secondary | ICD-10-CM | POA: Diagnosis not present

## 2020-05-30 DIAGNOSIS — R000 Tachycardia, unspecified: Secondary | ICD-10-CM | POA: Diagnosis not present

## 2020-05-30 DIAGNOSIS — Z79899 Other long term (current) drug therapy: Secondary | ICD-10-CM | POA: Diagnosis not present

## 2020-05-30 DIAGNOSIS — R079 Chest pain, unspecified: Secondary | ICD-10-CM | POA: Diagnosis not present

## 2020-05-30 DIAGNOSIS — R0600 Dyspnea, unspecified: Secondary | ICD-10-CM | POA: Diagnosis not present

## 2020-05-30 DIAGNOSIS — I10 Essential (primary) hypertension: Secondary | ICD-10-CM | POA: Diagnosis not present

## 2020-05-30 DIAGNOSIS — R791 Abnormal coagulation profile: Secondary | ICD-10-CM | POA: Diagnosis not present

## 2020-05-30 DIAGNOSIS — R0602 Shortness of breath: Secondary | ICD-10-CM | POA: Diagnosis not present

## 2020-05-30 DIAGNOSIS — R059 Cough, unspecified: Secondary | ICD-10-CM | POA: Diagnosis not present

## 2020-05-30 DIAGNOSIS — U071 COVID-19: Secondary | ICD-10-CM | POA: Diagnosis not present

## 2020-05-30 DIAGNOSIS — F319 Bipolar disorder, unspecified: Secondary | ICD-10-CM | POA: Diagnosis not present

## 2020-05-31 ENCOUNTER — Other Ambulatory Visit: Payer: Self-pay | Admitting: Family Medicine

## 2020-06-06 DIAGNOSIS — U071 COVID-19: Secondary | ICD-10-CM | POA: Diagnosis not present

## 2020-06-06 DIAGNOSIS — M2550 Pain in unspecified joint: Secondary | ICD-10-CM | POA: Diagnosis not present

## 2020-06-13 DIAGNOSIS — M2550 Pain in unspecified joint: Secondary | ICD-10-CM | POA: Diagnosis not present

## 2020-07-14 DIAGNOSIS — E559 Vitamin D deficiency, unspecified: Secondary | ICD-10-CM | POA: Diagnosis not present

## 2020-07-14 DIAGNOSIS — M2550 Pain in unspecified joint: Secondary | ICD-10-CM | POA: Diagnosis not present

## 2020-07-14 DIAGNOSIS — R112 Nausea with vomiting, unspecified: Secondary | ICD-10-CM | POA: Diagnosis not present

## 2020-07-14 DIAGNOSIS — R894 Abnormal immunological findings in specimens from other organs, systems and tissues: Secondary | ICD-10-CM | POA: Diagnosis not present

## 2020-07-14 DIAGNOSIS — F3170 Bipolar disorder, currently in remission, most recent episode unspecified: Secondary | ICD-10-CM | POA: Diagnosis not present

## 2020-07-14 DIAGNOSIS — R5383 Other fatigue: Secondary | ICD-10-CM | POA: Diagnosis not present

## 2020-07-15 DIAGNOSIS — R059 Cough, unspecified: Secondary | ICD-10-CM | POA: Diagnosis not present

## 2020-07-15 DIAGNOSIS — J029 Acute pharyngitis, unspecified: Secondary | ICD-10-CM | POA: Diagnosis not present

## 2020-07-15 DIAGNOSIS — J329 Chronic sinusitis, unspecified: Secondary | ICD-10-CM | POA: Diagnosis not present

## 2020-07-15 DIAGNOSIS — Z20822 Contact with and (suspected) exposure to covid-19: Secondary | ICD-10-CM | POA: Diagnosis not present

## 2020-08-26 DIAGNOSIS — M2550 Pain in unspecified joint: Secondary | ICD-10-CM | POA: Diagnosis not present

## 2020-08-26 DIAGNOSIS — Z79899 Other long term (current) drug therapy: Secondary | ICD-10-CM | POA: Diagnosis not present

## 2020-08-26 DIAGNOSIS — F3130 Bipolar disorder, current episode depressed, mild or moderate severity, unspecified: Secondary | ICD-10-CM | POA: Diagnosis not present

## 2020-08-26 DIAGNOSIS — M064 Inflammatory polyarthropathy: Secondary | ICD-10-CM | POA: Diagnosis not present

## 2020-09-05 DIAGNOSIS — R000 Tachycardia, unspecified: Secondary | ICD-10-CM | POA: Diagnosis not present

## 2020-09-05 DIAGNOSIS — I10 Essential (primary) hypertension: Secondary | ICD-10-CM | POA: Diagnosis not present

## 2020-09-05 DIAGNOSIS — J302 Other seasonal allergic rhinitis: Secondary | ICD-10-CM | POA: Diagnosis not present

## 2020-09-05 DIAGNOSIS — F3132 Bipolar disorder, current episode depressed, moderate: Secondary | ICD-10-CM | POA: Diagnosis not present

## 2020-09-06 DIAGNOSIS — G43909 Migraine, unspecified, not intractable, without status migrainosus: Secondary | ICD-10-CM | POA: Diagnosis not present

## 2020-09-06 DIAGNOSIS — R519 Headache, unspecified: Secondary | ICD-10-CM | POA: Diagnosis not present

## 2020-09-22 DIAGNOSIS — Z79899 Other long term (current) drug therapy: Secondary | ICD-10-CM | POA: Diagnosis not present

## 2020-09-22 DIAGNOSIS — M064 Inflammatory polyarthropathy: Secondary | ICD-10-CM | POA: Diagnosis not present

## 2020-09-26 ENCOUNTER — Ambulatory Visit: Payer: BC Managed Care – PPO | Admitting: Family Medicine

## 2020-09-26 ENCOUNTER — Encounter: Payer: Self-pay | Admitting: Family Medicine

## 2020-09-26 ENCOUNTER — Other Ambulatory Visit: Payer: Self-pay

## 2020-09-26 VITALS — BP 135/77 | HR 119 | Ht 66.0 in | Wt 315.0 lb

## 2020-09-26 DIAGNOSIS — M2550 Pain in unspecified joint: Secondary | ICD-10-CM

## 2020-09-26 DIAGNOSIS — M064 Inflammatory polyarthropathy: Secondary | ICD-10-CM

## 2020-09-26 DIAGNOSIS — R55 Syncope and collapse: Secondary | ICD-10-CM | POA: Diagnosis not present

## 2020-09-26 DIAGNOSIS — R000 Tachycardia, unspecified: Secondary | ICD-10-CM

## 2020-09-26 DIAGNOSIS — I491 Atrial premature depolarization: Secondary | ICD-10-CM | POA: Diagnosis not present

## 2020-09-26 DIAGNOSIS — G8929 Other chronic pain: Secondary | ICD-10-CM

## 2020-09-26 DIAGNOSIS — U099 Post covid-19 condition, unspecified: Secondary | ICD-10-CM

## 2020-09-26 MED ORDER — METOPROLOL SUCCINATE ER 100 MG PO TB24: 100.0000 mg | ORAL_TABLET | Freq: Every day | ORAL | 0 refills | Status: DC

## 2020-09-26 NOTE — Progress Notes (Signed)
Established Patient Office Visit  Subjective:  Patient ID: Tonya Clark, female    DOB: 09-Apr-1996  Age: 25 y.o. MRN: [id number]  CC:  Chief Complaint  Patient presents with  . Follow-up    HPI [name] presents for sxs that started since Jan after having had COVID.    In particular she has had a lot of joint pain and swelling particularly in her hands and feet but really multiple joints over her body.  In fact she ended up going to her primary care provider where she was living previously and they did some lab work.  Evidently some of her blood work levels were mildly elevated including her sed rate and ANA.  So they referred her to rheumatology they feel like she has some type of inflammatory polyarthritis but they have not named a discrete disease process.  Unfortunately her pain was severe enough that she had difficulty working as a Runner, broadcasting/film/video.  Unclear if it is related to COVID or COVID actually just revealed what was underlying.  She has been started on sulfasalazine but she has not noticed any significant improvement in her symptoms currently.  Bipolar disorder-also some of her psychiatric medications have changed since she was last here.  Again she has moved back into the area so is here to reestablish care.  She is currently on Vraylar instead of Jordan and says she is actually doing really well with that.  She has also had several episodes of feeling lightheaded and nearly passing out.  Past Medical History:  Diagnosis Date  . Arrhythmia   . Bipolar 1 disorder (HCC) 08/21/2013  . Jaundice, neonatal   . Kidney stone   . Premature birth    37 weeks    Past Surgical History:  Procedure Laterality Date  . TONSILLECTOMY  2009    Family History  Problem Relation Age of Onset  . Hypertension Mother   . Allergies Mother   . Anxiety disorder Father   . Hypertension Father     Social History   Socioeconomic History  . Marital status: Single    Spouse name: Not on file   . Number of children: Not on file  . Years of education: Not on file  . Highest education level: Not on file  Occupational History  . Occupation: Consulting civil engineer  Tobacco Use  . Smoking status: Never Smoker  . Smokeless tobacco: Never Used  Vaping Use  . Vaping Use: Never used  Substance and Sexual Activity  . Alcohol use: No  . Drug use: No  . Sexual activity: Not on file  Other Topics Concern  . Not on file  Social History Narrative   Was home schooled for HS. She is now working at a [employer] 2-3 times per week. Born in [place].    Social Determinants of Health   Financial Resource Strain: Not on file  Food Insecurity: Not on file  Transportation Needs: Not on file  Physical Activity: Not on file  Stress: Not on file  Social Connections: Not on file  Intimate Partner Violence: Not on file    Outpatient Medications Prior to Visit  Medication Sig Dispense Refill  . amitriptyline (ELAVIL) 100 MG tablet Take 100 mg by mouth at bedtime.    Colleen Can FE 1.5/30 1.5-30 MG-MCG tablet Take 1 tablet by mouth daily.    Marland Kitchen losartan (COZAAR) 50 MG tablet Take 1 tablet by mouth daily.    . montelukast (SINGULAIR) 10 MG tablet Take  1 tablet by mouth daily.    Marland Kitchen omeprazole (PRILOSEC) 40 MG capsule Take by mouth every morning.    . Oxcarbazepine (TRILEPTAL) 300 MG tablet Take 300 mg by mouth 2 (two) times daily.    . QUEtiapine (SEROQUEL) 100 MG tablet Take 200 mg by mouth at bedtime.    . sulfaSALAzine (AZULFIDINE) 500 MG EC tablet Take by mouth 2 (two) times daily.    Marland Kitchen VRAYLAR capsule Take 3 mg by mouth at bedtime.    Marland Kitchen VYVANSE 30 MG capsule Take 30 mg by mouth every morning.    . metoprolol tartrate (LOPRESSOR) 50 MG tablet Take 50 mg by mouth daily.    . AJOVY 225 MG/1.5ML SOAJ INJECT 1 PEN INTO THE SKIN EVERY 30 DAYS 6 mL 2  . buPROPion (WELLBUTRIN XL) 150 MG 24 hr tablet     . HYDROcodone-acetaminophen (NORCO) 10-325 MG tablet Take 1/2 tab to 1 tab by mouth every six hours  as needed for severe pain. 20 tablet 0  . hydrOXYzine (VISTARIL) 25 MG capsule 1 capsule.    . Insulin Pen Needle 31G X 8 MM MISC Inject into skin once daily. 100 each PRN  . JUNEL 1.5/30 1.5-30 MG-MCG tablet TAKE 1 TABLET BY MOUTH EVERY DAY 84 tablet 3  . LATUDA 80 MG TABS tablet Take 80 mg by mouth daily.    . Liraglutide -Weight Management (SAXENDA) 18 MG/3ML SOPN Inject 0.1 mLs (0.6 mg total) into the skin daily. 6 mL 0  . metoprolol succinate (TOPROL-XL) 25 MG 24 hr tablet TAKE 1 TABLET BY MOUTH EVERY DAY 90 tablet 1  . ondansetron (ZOFRAN) 8 MG tablet Take 1 tablet (8 mg total) by mouth every 8 (eight) hours as needed for nausea or vomiting. 60 tablet 2  . OXcarbazepine (TRILEPTAL) 150 MG tablet Take 1 tablet (150 mg total) by mouth 2 (two) times daily. (Patient taking differently: Take 300 mg by mouth 2 (two) times daily.) 60 tablet 0  . promethazine (PHENERGAN) 25 MG tablet Take 1 tablet (25 mg total) by mouth every 6 (six) hours as needed for nausea or vomiting. 30 tablet 2  . QUEtiapine (SEROQUEL) 50 MG tablet Take 100 mg by mouth at bedtime as needed.     No facility-administered medications prior to visit.    Allergies  Allergen Reactions  . Lamictal [Lamotrigine] Rash  . Trazodone And Nefazodone Other (See Comments)    Pt states, "sleep paralysis"    ROS Review of Systems    Objective:    Physical Exam  BP 135/77   Pulse (!) 119   Ht 5\' 6"  (1.676 m)   Wt (!) 315 lb (142.9 kg)   SpO2 100%   BMI 50.84 kg/m  Wt Readings from Last 3 Encounters:  09/26/20 (!) 315 lb (142.9 kg)  11/13/19 (!) 303 lb (137.4 kg)  04/23/19 266 lb (120.7 kg)     Health Maintenance Due  Topic Date Due  . HIV Screening  Never done  . Hepatitis C Screening  Never done  . COVID-19 Vaccine (3 - Booster for Moderna series) 03/02/2020    There are no preventive care reminders to display for this patient.  Lab Results  Component Value Date   TSH 1.03 09/05/2017   Lab Results   Component Value Date   WBC 14.1 (H) 05/30/2017   HGB 12.4 05/30/2017   HCT 39.5 05/30/2017   MCV 74.4 (L) 05/30/2017   PLT 322 05/30/2017   Lab Results  Component  Value Date   NA 137 05/30/2017   K 4.0 05/30/2017   CO2 22 05/30/2017   GLUCOSE 87 05/30/2017   BUN 9 05/30/2017   CREATININE 0.82 05/30/2017   BILITOT 0.7 05/30/2017   ALKPHOS 68 05/30/2017   AST 20 05/30/2017   ALT 16 05/30/2017   PROT 7.9 05/30/2017   ALBUMIN 4.3 05/30/2017   CALCIUM 9.5 05/30/2017   ANIONGAP 12 05/30/2017   Lab Results  Component Value Date   CHOL 148 09/05/2017   Lab Results  Component Value Date   HDL 32 (A) 09/05/2017   Lab Results  Component Value Date   LDLCALC 95 09/05/2017   Lab Results  Component Value Date   TRIG 105 09/05/2017   No results found for: [facility] Lab Results  Component Value Date   HGBA1C 5.6 11/13/2019      Assessment & Plan:   Problem List Items Addressed This Visit      Cardiovascular and Mediastinum   PAC (premature atrial contraction)    I increased your metoprolol also lets give that a try for the next 2 weeks and see if you feel like it is helping at all with your heart rate control if not then we can try switching medications.      Relevant Medications   losartan (COZAAR) 50 MG tablet   metoprolol succinate (TOPROL-XL) 100 MG 24 hr tablet     Musculoskeletal and Integument   Inflammatory polyarthritis (HCC)    We will go ahead and place referral to rheumatology to get her established here locally.  It may take a month or 2 to get her a local appointment so she may want to keep her regularly scheduled follow-up for next week.  Continue with sulfasalazine.      Relevant Orders   Ambulatory referral to Rheumatology     Other   Tachycardia    I increased your metoprolol also lets give that a try for the next 2 weeks and see if you feel like it is helping at all with your heart rate control if not then we can try switching medications.       Post-COVID chronic joint pain   Relevant Medications   amitriptyline (ELAVIL) 100 MG tablet   Oxcarbazepine (TRILEPTAL) 300 MG tablet    Other Visit Diagnoses    Near syncope    -  Primary   Relevant Medications   losartan (COZAAR) 50 MG tablet   metoprolol succinate (TOPROL-XL) 100 MG 24 hr tablet   Other Relevant Orders   Ambulatory referral to Cardiology   ECHOCARDIOGRAM COMPLETE      Near syncope-we discussed maximizing blood pressure control.  Continue with losartan 50 mg and metoprolol 100 mg.  Going to place referral to cardiology.  We will also go ahead and place echocardiogram for further work-up.  Meds ordered this encounter  Medications  . metoprolol succinate (TOPROL-XL) 100 MG 24 hr tablet    Sig: Take 1 tablet (100 mg total) by mouth daily. Take with or immediately following a meal.    Dispense:  90 tablet    Refill:  0    Follow-up: Return in about 4 weeks (around 10/24/2020) for Recheck blood pressure and pulse.Nani Gasser, MD

## 2020-09-26 NOTE — Patient Instructions (Signed)
I increased your metoprolol also lets give that a try for the next 2 weeks and see if you feel like it is helping at all with your heart rate control if not then we can try switching medications.  Your repeat blood pressure actually looks much better today so we will keep an eye on it just continue to work on low-sodium diet.  You need refills just let me know.

## 2020-09-29 DIAGNOSIS — R000 Tachycardia, unspecified: Secondary | ICD-10-CM | POA: Insufficient documentation

## 2020-09-29 DIAGNOSIS — G8929 Other chronic pain: Secondary | ICD-10-CM | POA: Insufficient documentation

## 2020-09-29 DIAGNOSIS — M064 Inflammatory polyarthropathy: Secondary | ICD-10-CM | POA: Insufficient documentation

## 2020-09-29 DIAGNOSIS — U099 Post covid-19 condition, unspecified: Secondary | ICD-10-CM | POA: Insufficient documentation

## 2020-09-29 NOTE — Assessment & Plan Note (Signed)
We will go ahead and place referral to rheumatology to get her established here locally.  It may take a month or 2 to get her a local appointment so she may want to keep her regularly scheduled follow-up for next week.  Continue with sulfasalazine.

## 2020-09-29 NOTE — Assessment & Plan Note (Signed)
I increased your metoprolol also lets give that a try for the next 2 weeks and see if you feel like it is helping at all with your heart rate control if not then we can try switching medications. 

## 2020-09-29 NOTE — Assessment & Plan Note (Signed)
I increased your metoprolol also lets give that a try for the next 2 weeks and see if you feel like it is helping at all with your heart rate control if not then we can try switching medications.

## 2020-10-02 ENCOUNTER — Encounter: Payer: Self-pay | Admitting: Family Medicine

## 2020-10-02 DIAGNOSIS — M2550 Pain in unspecified joint: Secondary | ICD-10-CM | POA: Diagnosis not present

## 2020-10-02 DIAGNOSIS — Z1159 Encounter for screening for other viral diseases: Secondary | ICD-10-CM | POA: Diagnosis not present

## 2020-10-02 DIAGNOSIS — F3130 Bipolar disorder, current episode depressed, mild or moderate severity, unspecified: Secondary | ICD-10-CM | POA: Diagnosis not present

## 2020-10-02 DIAGNOSIS — R7982 Elevated C-reactive protein (CRP): Secondary | ICD-10-CM | POA: Diagnosis not present

## 2020-10-02 DIAGNOSIS — Z79899 Other long term (current) drug therapy: Secondary | ICD-10-CM | POA: Diagnosis not present

## 2020-10-02 DIAGNOSIS — E611 Iron deficiency: Secondary | ICD-10-CM | POA: Diagnosis not present

## 2020-10-02 DIAGNOSIS — R059 Cough, unspecified: Secondary | ICD-10-CM | POA: Diagnosis not present

## 2020-10-02 DIAGNOSIS — M064 Inflammatory polyarthropathy: Secondary | ICD-10-CM | POA: Diagnosis not present

## 2020-10-03 ENCOUNTER — Encounter: Payer: Self-pay | Admitting: Family Medicine

## 2020-10-13 NOTE — Telephone Encounter (Signed)
Tonya Clark has decided to stay with her other rheumatologist for now I closed the referral. - CF

## 2020-10-15 ENCOUNTER — Ambulatory Visit (HOSPITAL_BASED_OUTPATIENT_CLINIC_OR_DEPARTMENT_OTHER)
Admission: RE | Admit: 2020-10-15 | Discharge: 2020-10-15 | Disposition: A | Discharge status: Home / Self Care | Payer: BC Managed Care – PPO | Source: Ambulatory Visit | Attending: Family Medicine | Admitting: Family Medicine

## 2020-10-15 ENCOUNTER — Encounter (HOSPITAL_BASED_OUTPATIENT_CLINIC_OR_DEPARTMENT_OTHER): Payer: Self-pay

## 2020-10-15 ENCOUNTER — Other Ambulatory Visit: Payer: Self-pay

## 2020-10-15 DIAGNOSIS — R55 Syncope and collapse: Secondary | ICD-10-CM | POA: Diagnosis not present

## 2020-10-15 MED ORDER — PERFLUTREN LIPID MICROSPHERE
1.0000 mL | INTRAVENOUS | Status: AC | PRN
  Administered 2020-10-15: 2 mL via INTRAVENOUS

## 2020-10-15 NOTE — Progress Notes (Unsigned)
Patient here for echocardiogram complete. Administered 2 cc's of Definity (Perflutern Lipid Microsphere) and she had a reaction 45 seconds later. She felt nauseated, hot all over, and tingling in her tongue, throat, hands and feet.  All symptoms subsided after 2-3 minutes.  An incident report was documented in the Safety Portal.   Elliot Gault, RDCS, RVT [facility]

## 2020-10-17 DIAGNOSIS — R7982 Elevated C-reactive protein (CRP): Secondary | ICD-10-CM | POA: Diagnosis not present

## 2020-10-17 DIAGNOSIS — M064 Inflammatory polyarthropathy: Secondary | ICD-10-CM | POA: Diagnosis not present

## 2020-10-17 DIAGNOSIS — M2550 Pain in unspecified joint: Secondary | ICD-10-CM | POA: Diagnosis not present

## 2020-10-21 LAB — ECHOCARDIOGRAM COMPLETE
AR max vel: 2.37 cm2
AV Area VTI: 2.52 cm2
AV Area mean vel: 2.4 cm2
AV Mean grad: 5 mmHg
AV Peak grad: 11.3 mmHg
Ao pk vel: 1.68 m/s
Area-P 1/2: 5.27 cm2
S' Lateral: 1.8 cm

## 2020-10-23 DIAGNOSIS — N200 Calculus of kidney: Secondary | ICD-10-CM | POA: Insufficient documentation

## 2020-10-23 DIAGNOSIS — I499 Cardiac arrhythmia, unspecified: Secondary | ICD-10-CM | POA: Insufficient documentation

## 2020-10-24 ENCOUNTER — Other Ambulatory Visit: Payer: Self-pay

## 2020-10-24 ENCOUNTER — Encounter: Payer: Self-pay | Admitting: Family Medicine

## 2020-10-24 ENCOUNTER — Ambulatory Visit: Payer: BC Managed Care – PPO | Admitting: Family Medicine

## 2020-10-24 VITALS — BP 141/83 | HR 136 | Ht 66.0 in | Wt 324.0 lb

## 2020-10-24 DIAGNOSIS — R61 Generalized hyperhidrosis: Secondary | ICD-10-CM

## 2020-10-24 DIAGNOSIS — E538 Deficiency of other specified B group vitamins: Secondary | ICD-10-CM | POA: Diagnosis not present

## 2020-10-24 DIAGNOSIS — F319 Bipolar disorder, unspecified: Secondary | ICD-10-CM

## 2020-10-24 DIAGNOSIS — I10 Essential (primary) hypertension: Secondary | ICD-10-CM

## 2020-10-24 DIAGNOSIS — R000 Tachycardia, unspecified: Secondary | ICD-10-CM

## 2020-10-24 DIAGNOSIS — Z6841 Body Mass Index (BMI) 40.0 and over, adult: Secondary | ICD-10-CM

## 2020-10-24 MED ORDER — LOSARTAN POTASSIUM-HCTZ 50-12.5 MG PO TABS: 1.0000 | ORAL_TABLET | Freq: Every day | ORAL | 0 refills | Status: DC

## 2020-10-24 NOTE — Assessment & Plan Note (Signed)
Has appoint with cardiology early next week.  Could consider switching her metoprolol to Bystolic.  We will have cardiology address further.  I still think could be related to post-COVID symptoms.

## 2020-10-24 NOTE — Assessment & Plan Note (Signed)
Uncontrolled.  Discussed adding in hydrochlorothiazide to her regimen we will combine it with the losartan and see if we can get just a little bit better blood pressure control.  Also discussed DASH diet.  Also would like to work on improving activity level and losing weight.

## 2020-10-24 NOTE — Progress Notes (Signed)
Pt reports that her HR goes as low as 110 and as high as 170. She averages around 120-150.   She notices that when she goes to stand up she will get a sensation of dizziness and feeling spacey. She said that when this happens her heart rate shoots up and it back down after 5-10 minutes.   She wanted to know if she could get her B12 checked. Her Rheumatologist suggested this. Her next appt is in July.

## 2020-10-24 NOTE — Patient Instructions (Addendum)

## 2020-10-24 NOTE — Assessment & Plan Note (Signed)
Gust starting introductory yoga which was mostly focused on just stretching and deep breathing.  And then advancing as she builds up some strength and tolerance and improves her core strength.

## 2020-10-24 NOTE — Assessment & Plan Note (Signed)
Plan to recheck B12.  She has had to have injections previously.

## 2020-10-24 NOTE — Progress Notes (Signed)
Established Patient Office Visit  Subjective:  Patient ID: Tonya Clark, female    DOB: August 29, 1995  Age: 25 y.o. MRN: [id number]  CC:  Chief Complaint  Patient presents with  . Hypertension    HPI Tonya Clark presents for   Tonya Clark reports that her HR goes as low as 110 and as high as 170. She averages around 120-150.  She does have an appoint with cardiology next week.  She is currently on metoprolol 100 mg.  She notices that when she goes to stand up she will get a sensation of dizziness and feeling spacey. She said that when this happens her heart rate shoots up and it back down after 5-10 minutes.   She wanted to know if she could get her B12 checked. Her Rheumatologist suggested this. Her next appt is in July.  They recently tried to increase her sulfasalazine but she did not tolerate it well she had GI side effects.  So they are actually looking at considering Biologics as an option.  She did get a new job but she is worried that the joint pain may keep her from being able to continue her job effectively.  She would like to start exercising again but wants to know what I would recommend she try doing some swimming with even just doggy paddling and really it worsened her pain.  Is also been experiencing sweats to the point where she feels completely drenched and her clothes are wet.  Can happen during the day at any time.    Past Medical History:  Diagnosis Date  . Arrhythmia   . Bipolar 1 disorder (HCC) 08/21/2013  . Jaundice, neonatal   . Kidney stone   . Premature birth    37 weeks    Past Surgical History:  Procedure Laterality Date  . TONSILLECTOMY  2009    Family History  Problem Relation Age of Onset  . Hypertension Mother   . Allergies Mother   . Anxiety disorder Father   . Hypertension Father     Social History   Socioeconomic History  . Marital status: Single    Spouse name: Not on file  . Number of children: Not on file  . Years of education:  Not on file  . Highest education level: Not on file  Occupational History  . Occupation: Consulting civil engineer  Tobacco Use  . Smoking status: Never Smoker  . Smokeless tobacco: Never Used  Vaping Use  . Vaping Use: Never used  Substance and Sexual Activity  . Alcohol use: No  . Drug use: No  . Sexual activity: Not on file  Other Topics Concern  . Not on file  Social History Narrative   Was home schooled for HS. She is now working at a [employer] 2-3 times per week. Born in [place].    Social Determinants of Health   Financial Resource Strain: Not on file  Food Insecurity: Not on file  Transportation Needs: Not on file  Physical Activity: Not on file  Stress: Not on file  Social Connections: Not on file  Intimate Partner Violence: Not on file    Outpatient Medications Prior to Visit  Medication Sig Dispense Refill  . AJOVY 225 MG/1.5ML SOAJ INJECT 1 PEN INTO THE SKIN EVERY 30 DAYS 6 mL 2  . amitriptyline (ELAVIL) 100 MG tablet Take 100 mg by mouth at bedtime.    . clonazePAM (KLONOPIN) 0.5 MG tablet Take 0.5 mg by mouth daily as needed.    Marland Kitchen  JUNEL FE 1.5/30 1.5-30 MG-MCG tablet Take 1 tablet by mouth daily.    . metoprolol succinate (TOPROL-XL) 100 MG 24 hr tablet Take 1 tablet (100 mg total) by mouth daily. Take with or immediately following a meal. 90 tablet 0  . montelukast (SINGULAIR) 10 MG tablet Take 1 tablet by mouth daily.    Marland Kitchen omeprazole (PRILOSEC) 40 MG capsule Take by mouth every morning.    . Oxcarbazepine (TRILEPTAL) 300 MG tablet Take 300 mg by mouth 2 (two) times daily.    . QUEtiapine (SEROQUEL) 200 MG tablet Take 200 mg by mouth at bedtime.    . sulfaSALAzine (AZULFIDINE) 500 MG EC tablet Take by mouth 2 (two) times daily.    Marland Kitchen VRAYLAR 4.5 MG CAPS Take 1 capsule by mouth daily.    Marland Kitchen VYVANSE 30 MG capsule Take 30 mg by mouth every morning.    Marland Kitchen losartan (COZAAR) 50 MG tablet Take 1 tablet by mouth daily.    . QUEtiapine (SEROQUEL) 100 MG tablet Take 200 mg  by mouth at bedtime.    Marland Kitchen VRAYLAR capsule Take 3 mg by mouth at bedtime.     No facility-administered medications prior to visit.    Allergies  Allergen Reactions  . Lamictal [Lamotrigine] Rash  . Trazodone And Nefazodone Other (See Comments)    Tonya Clark states, "sleep paralysis"    ROS Review of Systems    Objective:    Physical Exam Constitutional:      Appearance: She is well-developed.  HENT:     Head: Normocephalic and atraumatic.  Cardiovascular:     Rate and Rhythm: Normal rate and regular rhythm.     Heart sounds: Normal heart sounds.  Pulmonary:     Effort: Pulmonary effort is normal.     Breath sounds: Normal breath sounds.  Skin:    General: Skin is warm and dry.  Neurological:     Mental Status: She is alert and oriented to person, place, and time.  Psychiatric:        Behavior: Behavior normal.     BP (!) 141/83   Pulse (!) 136   Ht 5\' 6"  (1.676 m)   Wt (!) 324 lb (147 kg)   SpO2 99%   BMI 52.29 kg/m  Wt Readings from Last 3 Encounters:  10/24/20 (!) 324 lb (147 kg)  09/26/20 (!) 315 lb (142.9 kg)  11/13/19 (!) 303 lb (137.4 kg)     Health Maintenance Due  Topic Date Due  . HIV Screening  Never done  . Hepatitis C Screening  Never done  . COVID-19 Vaccine (3 - Booster for Moderna series) 02/01/2020    There are no preventive care reminders to display for this patient.  Lab Results  Component Value Date   TSH 1.03 09/05/2017   Lab Results  Component Value Date   WBC 14.1 (H) 05/30/2017   HGB 12.4 05/30/2017   HCT 39.5 05/30/2017   MCV 74.4 (L) 05/30/2017   PLT 322 05/30/2017   Lab Results  Component Value Date   NA 137 05/30/2017   K 4.0 05/30/2017   CO2 22 05/30/2017   GLUCOSE 87 05/30/2017   BUN 9 05/30/2017   CREATININE 0.82 05/30/2017   BILITOT 0.7 05/30/2017   ALKPHOS 68 05/30/2017   AST 20 05/30/2017   ALT 16 05/30/2017   PROT 7.9 05/30/2017   ALBUMIN 4.3 05/30/2017   CALCIUM 9.5 05/30/2017   ANIONGAP 12 05/30/2017    Lab Results  Component Value  Date   CHOL 148 09/05/2017   Lab Results  Component Value Date   HDL 32 (A) 09/05/2017   Lab Results  Component Value Date   LDLCALC 95 09/05/2017   Lab Results  Component Value Date   TRIG 105 09/05/2017   No results found for: [facility] Lab Results  Component Value Date   HGBA1C 5.6 11/13/2019      Assessment & Plan:   Problem List Items Addressed This Visit      Cardiovascular and Mediastinum   Essential hypertension    Uncontrolled.  Discussed adding in hydrochlorothiazide to her regimen we will combine it with the losartan and see if we can get just a little bit better blood pressure control.  Also discussed DASH diet.  Also would like to work on improving activity level and losing weight.      Relevant Medications   losartan-hydrochlorothiazide (HYZAAR) 50-12.5 MG tablet   Other Relevant Orders   TSH     Other   Tachycardia    Has appoint with cardiology early next week.  Could consider switching her metoprolol to Bystolic.  We will have cardiology address further.  I still think could be related to post-COVID symptoms.      BMI 50.0-59.9, adult (HCC)    Gust starting introductory yoga which was mostly focused on just stretching and deep breathing.  And then advancing as she builds up some strength and tolerance and improves her core strength.      Bipolar 1 disorder (HCC)    Follows with psychiatry.  Doing well on her current regimen.      B12 deficiency    Plan to recheck B12.  She has had to have injections previously.      Relevant Orders   B12    Other Visit Diagnoses    Sweating profusely    -  Primary   Relevant Orders   TSH      Excess sweating-unclear etiology.  Consider medication side effect is a possibility.  We will check thyroid level.  Unfortunately there is not a lot of treatment except for just trying to keep cool using fans wearing more loose clothing that is moisture absorbing.  Meds ordered this  encounter  Medications  . losartan-hydrochlorothiazide (HYZAAR) 50-12.5 MG tablet    Sig: Take 1 tablet by mouth daily.    Dispense:  90 tablet    Refill:  0    Follow-up: Return in about 8 weeks (around 12/19/2020) for Hypertension.    Nani Gasser, MD

## 2020-10-24 NOTE — Assessment & Plan Note (Signed)
Follows with psychiatry.  Doing well on her current regimen. °

## 2020-10-25 LAB — TSH: TSH: 1.32 mIU/L

## 2020-10-25 LAB — VITAMIN B12: Vitamin B-12: 226 pg/mL (ref 200–1100)

## 2020-10-27 ENCOUNTER — Ambulatory Visit (INDEPENDENT_AMBULATORY_CARE_PROVIDER_SITE_OTHER): Payer: BC Managed Care – PPO | Admitting: Cardiology

## 2020-10-27 ENCOUNTER — Encounter: Payer: Self-pay | Admitting: Cardiology

## 2020-10-27 ENCOUNTER — Other Ambulatory Visit: Payer: Self-pay

## 2020-10-27 VITALS — BP 140/100 | HR 121 | Ht 66.0 in | Wt 323.0 lb

## 2020-10-27 DIAGNOSIS — I10 Essential (primary) hypertension: Secondary | ICD-10-CM | POA: Diagnosis not present

## 2020-10-27 DIAGNOSIS — R000 Tachycardia, unspecified: Secondary | ICD-10-CM

## 2020-10-27 DIAGNOSIS — R600 Localized edema: Secondary | ICD-10-CM

## 2020-10-27 DIAGNOSIS — Z8616 Personal history of COVID-19: Secondary | ICD-10-CM | POA: Diagnosis not present

## 2020-10-27 DIAGNOSIS — R0602 Shortness of breath: Secondary | ICD-10-CM

## 2020-10-27 MED ORDER — DILTIAZEM HCL ER COATED BEADS 180 MG PO CP24: 180.0000 mg | ORAL_CAPSULE | Freq: Every day | ORAL | 3 refills | Status: DC

## 2020-10-27 NOTE — Progress Notes (Signed)
Cardiology Office Note:    Date:  10/27/2020   ID:  Tonya Clark, DOB [birth year], MRN [id number]  PCP:  Agapito Games, MD  Cardiologist:  [name], DO  Electrophysiologist:  None   Referring MD: Agapito Games, *   Her blood pressure and my heart rate has been high since COVID  History of Present Illness:    Tonya Clark is a 25 y.o. female with a hx of recently diagnosed hypertension currently is on losartan 50 mg daily, hydrochlorothiazide 12.5 mg daily, Toprol-XL 100 mg daily, morbid obesity, history of COVID-19 infection in January 2022, autoimmune arthritis is here today at the request of her primary care provider.  The patient tells me since COVID-19 her blood pressure has been going up and has not been able to be controlled.  She also notes that since that time as well she has had some high heart rates that her PCP has been helping her control.  She offers no complaints at this time.  She did see her PCP on 06/22/2020 at that time her medication was changed from losartan 50 mg to losartan-hydrochlorothiazide 50-12.5 mg daily.  The patient tells me since COVID-19 infection she has had significant tachycardia which has been suspected to be autonomic dysfunction in the setting of her long COVID syndrome.  She had an echocardiogram in May which was essentially normal.  Past Medical History:  Diagnosis Date  . Arrhythmia   . Bipolar 1 disorder (HCC) 08/21/2013  . Jaundice, neonatal   . Kidney stone   . Premature birth    37 weeks    Past Surgical History:  Procedure Laterality Date  . TONSILLECTOMY  2009    Current Medications: Current Meds  Medication Sig  . AJOVY 225 MG/1.5ML SOAJ INJECT 1 PEN INTO THE SKIN EVERY 30 DAYS  . amitriptyline (ELAVIL) 100 MG tablet Take 100 mg by mouth at bedtime.  . clonazePAM (KLONOPIN) 0.5 MG tablet Take 0.5 mg by mouth daily as needed for anxiety.  Colleen Can FE 1.5/30 1.5-30 MG-MCG tablet Take 1 tablet by mouth daily.   Marland Kitchen losartan-hydrochlorothiazide (HYZAAR) 50-12.5 MG tablet Take 1 tablet by mouth daily.  . metoprolol succinate (TOPROL-XL) 100 MG 24 hr tablet Take 1 tablet (100 mg total) by mouth daily. Take with or immediately following a meal.  . montelukast (SINGULAIR) 10 MG tablet Take 1 tablet by mouth daily.  Marland Kitchen omeprazole (PRILOSEC) 40 MG capsule Take by mouth every morning.  . Oxcarbazepine (TRILEPTAL) 300 MG tablet Take 300 mg by mouth 2 (two) times daily.  . QUEtiapine (SEROQUEL) 200 MG tablet Take 200 mg by mouth at bedtime.  . sulfaSALAzine (AZULFIDINE) 500 MG EC tablet Take by mouth 2 (two) times daily.  Marland Kitchen VRAYLAR 4.5 MG CAPS Take 1 capsule by mouth daily.  Marland Kitchen VYVANSE 30 MG capsule Take 30 mg by mouth every morning.     Allergies:   Lamictal [lamotrigine], Trazodone and nefazodone, and Definity [perflutren lipid microsphere]   Social History   Socioeconomic History  . Marital status: Single    Spouse name: Not on file  . Number of children: Not on file  . Years of education: Not on file  . Highest education level: Not on file  Occupational History  . Occupation: Consulting civil engineer  Tobacco Use  . Smoking status: Never Smoker  . Smokeless tobacco: Never Used  Vaping Use  . Vaping Use: Never used  Substance and Sexual Activity  . Alcohol use: No  . Drug use:  No  . Sexual activity: Not on file  Other Topics Concern  . Not on file  Social History Narrative   Was home schooled for HS. She is now working at a [employer] 2-3 times per week. Born in [place].    Social Determinants of Health   Financial Resource Strain: Not on file  Food Insecurity: Not on file  Transportation Needs: Not on file  Physical Activity: Not on file  Stress: Not on file  Social Connections: Not on file     Family History: The patient's family history includes Allergies in her mother; Anxiety disorder in her father; Hypertension in her father and mother.  ROS:   Review of Systems  Constitution:  Negative for decreased appetite, fever and weight gain.  HENT: Negative for congestion, ear discharge, hoarse voice and sore throat.   Eyes: Negative for discharge, redness, vision loss in right eye and visual halos.  Cardiovascular: Negative for chest pain, dyspnea on exertion, leg swelling, orthopnea and palpitations.  Respiratory: Negative for cough, hemoptysis, shortness of breath and snoring.   Endocrine: Negative for heat intolerance and polyphagia.  Hematologic/Lymphatic: Negative for bleeding problem. Does not bruise/bleed easily.  Skin: Negative for flushing, nail changes, rash and suspicious lesions.  Musculoskeletal: Negative for arthritis, joint pain, muscle cramps, myalgias, neck pain and stiffness.  Gastrointestinal: Negative for abdominal pain, bowel incontinence, diarrhea and excessive appetite.  Genitourinary: Negative for decreased libido, genital sores and incomplete emptying.  Neurological: Negative for brief paralysis, focal weakness, headaches and loss of balance.  Psychiatric/Behavioral: Negative for altered mental status, depression and suicidal ideas.  Allergic/Immunologic: Negative for HIV exposure and persistent infections.    EKGs/Labs/Other Studies Reviewed:    The following studies were reviewed today:   EKG:  The ekg ordered today demonstrates sinus tachycardia, heart rate 121 bpm.  Transthoracic echocardiogram May 2022 IMPRESSIONS    1. Left ventricular ejection fraction, by estimation, is 60 to 65%. The  left ventricle has normal function. Left ventricular endocardial border  not optimally defined to evaluate regional wall motion. There is mild  concentric left ventricular  hypertrophy. Left ventricular diastolic parameters were normal.  2. Right ventricular systolic function is normal. The right ventricular  size is normal.  3. The mitral valve is normal in structure. No evidence of mitral valve  regurgitation. No evidence of mitral stenosis.   4. The aortic valve is tricuspid. Aortic valve regurgitation is not  visualized. No aortic stenosis is present.  5. The inferior vena cava is normal in size with greater than 50%  respiratory variability, suggesting right atrial pressure of 3 mmHg.   FINDINGS  Left Ventricle: Left ventricular ejection fraction, by estimation, is 60  to 65%. The left ventricle has normal function. Left ventricular  endocardial border not optimally defined to evaluate regional wall motion.  Definity contrast agent was given IV to  delineate the left ventricular endocardial borders. The left ventricular  internal cavity size was normal in size. There is mild concentric left  ventricular hypertrophy. Left ventricular diastolic parameters were  normal. Normal left ventricular filling  pressure.   Right Ventricle: The right ventricular size is normal. No increase in  right ventricular wall thickness. Right ventricular systolic function is  normal.   Left Atrium: Left atrial size was normal in size.   Right Atrium: Right atrial size was normal in size.   Pericardium: There is no evidence of pericardial effusion.   Mitral Valve: The mitral valve is normal in  structure. No evidence of  mitral valve regurgitation. No evidence of mitral valve stenosis.   Tricuspid Valve: The tricuspid valve is normal in structure. Tricuspid  valve regurgitation is trivial. No evidence of tricuspid stenosis.   Aortic Valve: The aortic valve is tricuspid. Aortic valve regurgitation is  not visualized. No aortic stenosis is present. Aortic valve mean gradient  measures 5.0 mmHg. Aortic valve peak gradient measures 11.3 mmHg. Aortic  valve area, by VTI measures 2.52  cm.   Pulmonic Valve: The pulmonic valve was normal in structure. Pulmonic valve  regurgitation is not visualized. No evidence of pulmonic stenosis.   Aorta: The aortic root, ascending aorta and aortic arch are all  structurally normal, with no evidence  of dilitation or obstruction.   Venous: The pulmonary veins were not well visualized. The inferior vena  cava was not well visualized. The inferior vena cava is normal in size  with greater than 50% respiratory variability, suggesting right atrial  pressure of 3 mmHg.   IAS/Shunts: No atrial level shunt detected by color flow Doppler.   Recent Labs: 10/24/2020: TSH 1.32  Recent Lipid Panel    Component Value Date/Time   CHOL 148 09/05/2017 0000   TRIG 105 09/05/2017 0000   HDL 32 (A) 09/05/2017 0000   LDLCALC 95 09/05/2017 0000    Physical Exam:    VS:  BP (!) 140/100 (BP Location: Left Arm)   Pulse (!) 121   Ht 5\' 6"  (1.676 m)   Wt (!) 323 lb (146.5 kg)   SpO2 98%   BMI 52.13 kg/m     Wt Readings from Last 3 Encounters:  10/27/20 (!) 323 lb (146.5 kg)  10/24/20 (!) 324 lb (147 kg)  09/26/20 (!) 315 lb (142.9 kg)     GEN: Well nourished, well developed in no acute distress HEENT: Normal NECK: No JVD; No carotid bruits LYMPHATICS: No lymphadenopathy CARDIAC: S1S2 noted,RRR, no murmurs, rubs, gallops RESPIRATORY:  Clear to auscultation without rales, wheezing or rhonchi  ABDOMEN: Soft, non-tender, non-distended, +bowel sounds, no guarding. EXTREMITIES: No edema, No cyanosis, no clubbing MUSCULOSKELETAL:  No deformity  SKIN: Warm and dry, with tattoos NEUROLOGIC:  Alert and oriented x 3, non-focal PSYCHIATRIC:  Normal affect, good insight  ASSESSMENT:    1. Essential hypertension   2. Sinus tachycardia   3. History of COVID-19   4. Morbid obesity (HCC)    PLAN:     She is hypertensive in the office today.  I am can keep the patient on her losartan-hydrochlorothiazide combination 50-12.5 mg daily.  I am going to titrate down and stop the beta-blocker, and started Cardizem 180 mg daily. I am hoping this will help with both her blood pressure and her heart rate.  I discussed with the patient there is multiple 20 days to optimize her antihypertensive medications.   But it would be beneficial for her to change 1 medication at a time.  She is agreeable to this.  She also does have morbid obesity which can affect the improvement of her blood pressure I discussed with the patient about medical weight management but she has had this in the past with [facility] and she declined.  She is going to be starting yoga hopefully this will help.  The patient understands the need to lose weight with diet and exercise. We have discussed specific strategies for this.  She follows with rheumatology for her autoimmune arthritis.  We talked about the potential factors for regular hypertension as she  is still young she has had a sleep study which did not show evidence of sleep apnea, recent TSH was normal.  If her blood pressure does not improve plan will be to get renal ultrasound, also rule out adrenal insufficiency.  The patient is in agreement with the above plan. The patient left the office in stable condition.  The patient will follow up in 4 weeks or sooner if needed.   Medication Adjustments/Labs and Tests Ordered: Current medicines are reviewed at length with the patient today.  Concerns regarding medicines are outlined above.  No orders of the defined types were placed in this encounter.  No orders of the defined types were placed in this encounter.   There are no Patient Instructions on file for this visit.   Adopting a Healthy Lifestyle.  Know what a healthy weight is for you (roughly BMI <25) and aim to maintain this   Aim for 7+ servings of fruits and vegetables daily   65-80+ fluid ounces of water or unsweet tea for healthy kidneys   Limit to max 1 drink of alcohol per day; avoid smoking/tobacco   Limit animal fats in diet for cholesterol and heart health - choose grass fed whenever available   Avoid highly processed foods, and foods high in saturated/trans fats   Aim for low stress - take time to unwind and care for your mental health   Aim  for 150 min of moderate intensity exercise weekly for heart health, and weights twice weekly for bone health   Aim for 7-9 hours of sleep daily   When it comes to diets, agreement about the perfect plan isnt easy to find, even among the experts. Experts at the [facility] developed an idea known as the Healthy Eating Plate. Just imagine a plate divided into logical, healthy portions.   The emphasis is on diet quality:   Load up on vegetables and fruits - one-half of your plate: Aim for color and variety, and remember that potatoes dont count.   Go for whole grains - one-quarter of your plate: Whole wheat, barley, wheat berries, quinoa, oats, brown rice, and foods made with them. If you want pasta, go with whole wheat pasta.   Protein power - one-quarter of your plate: Fish, chicken, beans, and nuts are all healthy, versatile protein sources. Limit red meat.   The diet, however, does go beyond the plate, offering a few other suggestions.   Use healthy plant oils, such as olive, canola, soy, corn, sunflower and peanut. Check the labels, and avoid partially hydrogenated oil, which have unhealthy trans fats.   If youre thirsty, drink water. Coffee and tea are good in moderation, but skip sugary drinks and limit milk and dairy products to one or two daily servings.   The type of carbohydrate in the diet is more important than the amount. Some sources of carbohydrates, such as vegetables, fruits, whole grains, and beans-are healthier than others.   Finally, stay active  Signed, Thomasene RippleKardie Rishit Burkhalter, DO  10/27/2020 4:12 PM    [facility]

## 2020-10-27 NOTE — Patient Instructions (Addendum)
Medication Instructions:  Your physician has recommended you make the following change in your medication:  TAPER: Toprol-XLTake 50 mg Tuesday and Wednesday. Skip Thursday. Take 25 mg Friday and Saturday. Stop on Sunday START: Cardizem 180 mg once daily on Thursday.  *If you need a refill on your cardiac medications before your next appointment, please call your pharmacy*   Lab Work: None If you have labs (blood work) drawn today and your tests are completely normal, you will receive your results only by: Marland Kitchen MyChart Message (if you have MyChart) OR . A paper copy in the mail If you have any lab test that is abnormal or we need to change your treatment, we will call you to review the results.   Testing/Procedures: None   Follow-Up: At [facility], you and your health needs are our priority.  As part of our continuing mission to provide you with exceptional heart care, we have created designated Provider Care Teams.  These Care Teams include your primary Cardiologist (physician) and Advanced Practice Providers (APPs -  Physician Assistants and Nurse Practitioners) who all work together to provide you with the care you need, when you need it.  We recommend signing up for the patient portal called "MyChart".  Sign up information is provided on this After Visit Summary.  MyChart is used to connect with patients for Virtual Visits (Telemedicine).  Patients are able to view lab/test results, encounter notes, upcoming appointments, etc.  Non-urgent messages can be sent to your provider as well.   To learn more about what you can do with MyChart, go to [email or web address].    Your next appointment:   4 week(s)  The format for your next appointment:   In Person  Provider:   [name], DO   Other Instructions

## 2020-10-29 ENCOUNTER — Other Ambulatory Visit: Payer: Self-pay

## 2020-10-29 ENCOUNTER — Ambulatory Visit (INDEPENDENT_AMBULATORY_CARE_PROVIDER_SITE_OTHER): Payer: BC Managed Care – PPO | Admitting: Physician Assistant

## 2020-10-29 VITALS — BP 142/89 | HR 120 | Temp 98.1°F | Resp 20 | Ht 66.0 in | Wt 323.0 lb

## 2020-10-29 DIAGNOSIS — E538 Deficiency of other specified B group vitamins: Secondary | ICD-10-CM

## 2020-10-29 MED ORDER — CYANOCOBALAMIN 1000 MCG/ML IJ SOLN
1000.0000 ug | Freq: Once | INTRAMUSCULAR | Status: AC
  Administered 2020-10-29: 1000 ug via INTRAMUSCULAR

## 2020-10-29 NOTE — Progress Notes (Signed)
Established Patient Office Visit  Subjective:  Patient ID: Tonya Clark, female    DOB: Aug 31, 1995  Age: 25 y.o. MRN: [id number]  CC:  Chief Complaint  Patient presents with  . Pernicious Anemia    HPI Kellin Fifer presents for a B12 injection. Denies muscle cramps, weakness or irregular heart rate. Injection given in left deltoid, pt tolerated well with no apparent complications.  Past Medical History:  Diagnosis Date  . Arrhythmia   . Bipolar 1 disorder (HCC) 08/21/2013  . Jaundice, neonatal   . Kidney stone   . Premature birth    37 weeks    Past Surgical History:  Procedure Laterality Date  . TONSILLECTOMY  2009    Family History  Problem Relation Age of Onset  . Hypertension Mother   . Allergies Mother   . Anxiety disorder Father   . Hypertension Father     Social History   Socioeconomic History  . Marital status: Single    Spouse name: Not on file  . Number of children: Not on file  . Years of education: Not on file  . Highest education level: Not on file  Occupational History  . Occupation: Consulting civil engineer  Tobacco Use  . Smoking status: Never Smoker  . Smokeless tobacco: Never Used  Vaping Use  . Vaping Use: Never used  Substance and Sexual Activity  . Alcohol use: No  . Drug use: No  . Sexual activity: Not on file  Other Topics Concern  . Not on file  Social History Narrative   Was home schooled for HS. She is now working at a [employer] 2-3 times per week. Born in [place].    Social Determinants of Health   Financial Resource Strain: Not on file  Food Insecurity: Not on file  Transportation Needs: Not on file  Physical Activity: Not on file  Stress: Not on file  Social Connections: Not on file  Intimate Partner Violence: Not on file    Outpatient Medications Prior to Visit  Medication Sig Dispense Refill  . AJOVY 225 MG/1.5ML SOAJ INJECT 1 PEN INTO THE SKIN EVERY 30 DAYS 6 mL 2  . amitriptyline (ELAVIL) 100 MG tablet Take  100 mg by mouth at bedtime.    . clonazePAM (KLONOPIN) 0.5 MG tablet Take 0.5 mg by mouth daily as needed for anxiety.    Marland Kitchen diltiazem (CARDIZEM CD) 180 MG 24 hr capsule Take 1 capsule (180 mg total) by mouth daily. 90 capsule 3  . JUNEL FE 1.5/30 1.5-30 MG-MCG tablet Take 1 tablet by mouth daily.    Marland Kitchen losartan-hydrochlorothiazide (HYZAAR) 50-12.5 MG tablet Take 1 tablet by mouth daily. 90 tablet 0  . metoprolol succinate (TOPROL-XL) 100 MG 24 hr tablet Take 1 tablet (100 mg total) by mouth daily. Take with or immediately following a meal. 90 tablet 0  . montelukast (SINGULAIR) 10 MG tablet Take 1 tablet by mouth daily.    Marland Kitchen omeprazole (PRILOSEC) 40 MG capsule Take by mouth every morning.    . Oxcarbazepine (TRILEPTAL) 300 MG tablet Take 300 mg by mouth 2 (two) times daily.    . QUEtiapine (SEROQUEL) 200 MG tablet Take 200 mg by mouth at bedtime.    . sulfaSALAzine (AZULFIDINE) 500 MG EC tablet Take by mouth 2 (two) times daily.    Marland Kitchen VRAYLAR 4.5 MG CAPS Take 1 capsule by mouth daily.    Marland Kitchen VYVANSE 30 MG capsule Take 30 mg by mouth every morning.  No facility-administered medications prior to visit.    Allergies  Allergen Reactions  . Lamictal [Lamotrigine] Rash  . Trazodone And Nefazodone Other (See Comments)    Pt states, "sleep paralysis"  . Definity [Perflutren Lipid Microsphere] Nausea And Vomiting and Other (See Comments)    Nauseated, felt hot all over, with tingling in throat, tongue, hands and feet    ROS Review of Systems    Objective:    Physical Exam  BP (!) 142/89 (BP Location: Left Arm, Patient Position: Sitting, Cuff Size: Large)   Pulse (!) 120   Temp 98.1 F (36.7 C) (Oral)   Resp 20   Ht 5\' 6"  (1.676 m)   Wt (!) 323 lb (146.5 kg)   SpO2 99%   BMI 52.13 kg/m  Wt Readings from Last 3 Encounters:  10/29/20 (!) 323 lb (146.5 kg)  10/27/20 (!) 323 lb (146.5 kg)  10/24/20 (!) 324 lb (147 kg)     Health Maintenance Due  Topic Date Due  . HIV Screening   Never done  . Hepatitis C Screening  Never done  . COVID-19 Vaccine (3 - Booster for Moderna series) 02/01/2020    There are no preventive care reminders to display for this patient.  Lab Results  Component Value Date   TSH 1.32 10/24/2020   Lab Results  Component Value Date   WBC 14.1 (H) 05/30/2017   HGB 12.4 05/30/2017   HCT 39.5 05/30/2017   MCV 74.4 (L) 05/30/2017   PLT 322 05/30/2017   Lab Results  Component Value Date   NA 137 05/30/2017   K 4.0 05/30/2017   CO2 22 05/30/2017   GLUCOSE 87 05/30/2017   BUN 9 05/30/2017   CREATININE 0.82 05/30/2017   BILITOT 0.7 05/30/2017   ALKPHOS 68 05/30/2017   AST 20 05/30/2017   ALT 16 05/30/2017   PROT 7.9 05/30/2017   ALBUMIN 4.3 05/30/2017   CALCIUM 9.5 05/30/2017   ANIONGAP 12 05/30/2017   Lab Results  Component Value Date   CHOL 148 09/05/2017   Lab Results  Component Value Date   HDL 32 (A) 09/05/2017   Lab Results  Component Value Date   LDLCALC 95 09/05/2017   Lab Results  Component Value Date   TRIG 105 09/05/2017   No results found for: [facility] Lab Results  Component Value Date   HGBA1C 5.6 11/13/2019      Assessment & Plan:  Denies muscle cramps, weakness or irregular heart rate. Injection given in left deltoid, pt tolerated well with no apparent complications. Pt will return in 2 weeks for next injection.  Problem List Items Addressed This Visit      Other   B12 deficiency - Primary      Meds ordered this encounter  Medications  . cyanocobalamin ((VITAMIN B-12)) injection 1,000 mcg    Follow-up: Return in about 2 weeks (around 11/12/2020) for B12 Injection .    11/14/2020, CMA

## 2020-10-30 NOTE — Progress Notes (Signed)
Agree with above plan. 

## 2020-11-02 ENCOUNTER — Encounter: Payer: Self-pay | Admitting: Family Medicine

## 2020-11-05 DIAGNOSIS — M461 Sacroiliitis, not elsewhere classified: Secondary | ICD-10-CM | POA: Diagnosis not present

## 2020-11-07 ENCOUNTER — Ambulatory Visit (INDEPENDENT_AMBULATORY_CARE_PROVIDER_SITE_OTHER): Payer: BC Managed Care – PPO | Admitting: Family Medicine

## 2020-11-07 ENCOUNTER — Encounter: Payer: Self-pay | Admitting: Family Medicine

## 2020-11-07 ENCOUNTER — Other Ambulatory Visit: Payer: Self-pay

## 2020-11-07 ENCOUNTER — Other Ambulatory Visit: Payer: Self-pay | Admitting: *Deleted

## 2020-11-07 VITALS — BP 122/72 | HR 106 | Ht 66.0 in | Wt 320.0 lb

## 2020-11-07 DIAGNOSIS — I10 Essential (primary) hypertension: Secondary | ICD-10-CM | POA: Diagnosis not present

## 2020-11-07 DIAGNOSIS — G43009 Migraine without aura, not intractable, without status migrainosus: Secondary | ICD-10-CM | POA: Diagnosis not present

## 2020-11-07 DIAGNOSIS — E538 Deficiency of other specified B group vitamins: Secondary | ICD-10-CM | POA: Diagnosis not present

## 2020-11-07 DIAGNOSIS — M064 Inflammatory polyarthropathy: Secondary | ICD-10-CM

## 2020-11-07 DIAGNOSIS — R413 Other amnesia: Secondary | ICD-10-CM | POA: Diagnosis not present

## 2020-11-07 DIAGNOSIS — F5102 Adjustment insomnia: Secondary | ICD-10-CM

## 2020-11-07 MED ORDER — CYANOCOBALAMIN 1000 MCG/ML IJ SOLN
1000.0000 ug | Freq: Once | INTRAMUSCULAR | Status: AC
  Administered 2020-11-07: 1000 ug via INTRAMUSCULAR

## 2020-11-07 NOTE — Addendum Note (Signed)
Addended by: Deno Etienne on: 11/07/2020 03:38 PM   Modules accepted: Orders

## 2020-11-07 NOTE — Progress Notes (Signed)
Established Patient Office Visit  Subjective:  Patient ID: Tonya Clark, female    DOB: 12-01-1995  Age: 25 y.o. MRN: [id number]  CC: No chief complaint on file.   HPI Tonya Clark presents for memory problems.    She c/ of recent onset or memory problems and confusion.  Has had increased fatigue as well.  She had recently started on 2 new medications 1 being the Cardizem CD about a week and a half ago and she also started triazolam about a week ago. She says she can feel disoriented and lost at times. She says she got to work and didn't realize where she was.  He has had a little bit more light sensitivity but no other new symptoms.  No recent increase or change in headaches.  He was started on the triazolam specifically for sleep about 8 days ago. It does work well for sleep.  She has been on some type of chronic sleep agents she was about 62 or 25 years old.  She is tried multiple options more recently she was on a combination of Seroquel and amitriptyline but it eventually just quit working.  Past Medical History:  Diagnosis Date   Arrhythmia    Bipolar 1 disorder (HCC) 08/21/2013   Jaundice, neonatal    Kidney stone    Premature birth    54 weeks    Past Surgical History:  Procedure Laterality Date   TONSILLECTOMY  2009    Family History  Problem Relation Age of Onset   Hypertension Mother    Allergies Mother    Anxiety disorder Father    Hypertension Father     Social History   Socioeconomic History   Marital status: Single    Spouse name: Not on file   Number of children: Not on file   Years of education: Not on file   Highest education level: Not on file  Occupational History   Occupation: Student  Tobacco Use   Smoking status: Never   Smokeless tobacco: Never  Vaping Use   Vaping Use: Never used  Substance and Sexual Activity   Alcohol use: No   Drug use: No   Sexual activity: Not on file  Other Topics Concern   Not on file  Social History  Narrative   Was home schooled for HS. She is now working at a [employer] 2-3 times per week. Born in [place].    Social Determinants of Health   Financial Resource Strain: Not on file  Food Insecurity: Not on file  Transportation Needs: Not on file  Physical Activity: Not on file  Stress: Not on file  Social Connections: Not on file  Intimate Partner Violence: Not on file    Outpatient Medications Prior to Visit  Medication Sig Dispense Refill   AJOVY 225 MG/1.5ML SOAJ INJECT 1 PEN INTO THE SKIN EVERY 30 DAYS 6 mL 2   diltiazem (CARDIZEM CD) 180 MG 24 hr capsule Take 1 capsule (180 mg total) by mouth daily. 90 capsule 3   JUNEL FE 1.5/30 1.5-30 MG-MCG tablet Take 1 tablet by mouth daily.     losartan-hydrochlorothiazide (HYZAAR) 50-12.5 MG tablet Take 1 tablet by mouth daily. 90 tablet 0   montelukast (SINGULAIR) 10 MG tablet Take 1 tablet by mouth daily.     omeprazole (PRILOSEC) 40 MG capsule Take by mouth every morning.     Oxcarbazepine (TRILEPTAL) 300 MG tablet Take 300 mg by mouth 2 (two) times daily.  sulfaSALAzine (AZULFIDINE) 500 MG EC tablet Take by mouth 2 (two) times daily.     triazolam (HALCION) 0.25 MG tablet Take 0.25-0.5 mg by mouth at bedtime.     VRAYLAR 4.5 MG CAPS Take 1 capsule by mouth daily.     VYVANSE 30 MG capsule Take 30 mg by mouth every morning.     amitriptyline (ELAVIL) 100 MG tablet Take 100 mg by mouth at bedtime.     clonazePAM (KLONOPIN) 0.5 MG tablet Take 0.5 mg by mouth daily as needed for anxiety.     metoprolol succinate (TOPROL-XL) 100 MG 24 hr tablet Take 1 tablet (100 mg total) by mouth daily. Take with or immediately following a meal. 90 tablet 0   QUEtiapine (SEROQUEL) 200 MG tablet Take 200 mg by mouth at bedtime.     No facility-administered medications prior to visit.    Allergies  Allergen Reactions   Lamictal [Lamotrigine] Rash   Trazodone And Nefazodone Other (See Comments)    Pt states, "sleep paralysis"    Definity [Perflutren Lipid Microsphere] Nausea And Vomiting and Other (See Comments)    Nauseated, felt hot all over, with tingling in throat, tongue, hands and feet    ROS Review of Systems    Objective:    Physical Exam Vitals reviewed.  Constitutional:      Appearance: She is well-developed.  HENT:     Head: Normocephalic and atraumatic.  Eyes:     Conjunctiva/sclera: Conjunctivae normal.  Cardiovascular:     Rate and Rhythm: Normal rate.  Pulmonary:     Effort: Pulmonary effort is normal.  Skin:    General: Skin is dry.     Coloration: Skin is not pale.  Neurological:     Mental Status: She is alert and oriented to person, place, and time.  Psychiatric:        Behavior: Behavior normal.    BP 132/85   Pulse (!) 113   Ht 5\' 6"  (1.676 m)   Wt (!) 320 lb (145.2 kg)   SpO2 100%   BMI 51.65 kg/m  Wt Readings from Last 3 Encounters:  11/07/20 (!) 320 lb (145.2 kg)  10/29/20 (!) 323 lb (146.5 kg)  10/27/20 (!) 323 lb (146.5 kg)     Health Maintenance Due  Topic Date Due   HIV Screening  Never done   Hepatitis C Screening  Never done   COVID-19 Vaccine (3 - Booster for Moderna series) 02/01/2020    There are no preventive care reminders to display for this patient.  Lab Results  Component Value Date   TSH 1.32 10/24/2020   Lab Results  Component Value Date   WBC 14.1 (H) 05/30/2017   HGB 12.4 05/30/2017   HCT 39.5 05/30/2017   MCV 74.4 (L) 05/30/2017   PLT 322 05/30/2017   Lab Results  Component Value Date   NA 137 05/30/2017   K 4.0 05/30/2017   CO2 22 05/30/2017   GLUCOSE 87 05/30/2017   BUN 9 05/30/2017   CREATININE 0.82 05/30/2017   BILITOT 0.7 05/30/2017   ALKPHOS 68 05/30/2017   AST 20 05/30/2017   ALT 16 05/30/2017   PROT 7.9 05/30/2017   ALBUMIN 4.3 05/30/2017   CALCIUM 9.5 05/30/2017   ANIONGAP 12 05/30/2017   Lab Results  Component Value Date   CHOL 148 09/05/2017   Lab Results  Component Value Date   HDL 32 (A) 09/05/2017    Lab Results  Component Value Date   LDLCALC  95 09/05/2017   Lab Results  Component Value Date   TRIG 105 09/05/2017   No results found for: [facility] Lab Results  Component Value Date   HGBA1C 5.6 11/13/2019      Assessment & Plan:   Problem List Items Addressed This Visit       Cardiovascular and Mediastinum   Migraine headache    Doing well on the Ajovy.       Essential hypertension    Blood pressure looks much much better today after the addition of Cardizem by cardiology.  We discussed that I think that this medication is least likely to be causing her side effects that she is currently experiencing.  But certainly if she wants to try holding it over the weekend she can it just may allow her blood pressure to go up a little bit and she may have a few more palpitations or flutters but if she wants to hold it for couple of days that would not be unreasonable.         Musculoskeletal and Integument   Inflammatory polyarthritis (HCC)    We discussed that her symptoms are actually quite dramatic and I do not feel like this is consistent with brain fog which is usually more mild.  I truly think that she is experiencing a medication/drug side effect.         Other   Insomnia    I think she would be a good candidate to cycle sleep aids.  There are some people who do build up a tolerance and so switching back to something that she is used in the past that was effective at least for a period of time would be a great option I really feel like the triazolam is causing her symptoms and only way to know would be to hold the medication for a period of time and see if it improves.       Other Visit Diagnoses     Memory difficulties    -  Primary       Memory difficulty-We discussed that her symptoms are actually quite dramatic and I do not feel like this is consistent with brain fog which is usually more mild.  I truly think that she is experiencing a medication/drug side  effect.  No orders of the defined types were placed in this encounter.   Follow-up: No follow-ups on file.    Nani Gasser, MD

## 2020-11-07 NOTE — Assessment & Plan Note (Signed)
I think she would be a good candidate to cycle sleep aids.  There are some people who do build up a tolerance and so switching back to something that she is used in the past that was effective at least for a period of time would be a great option I really feel like the triazolam is causing her symptoms and only way to know would be to hold the medication for a period of time and see if it improves.

## 2020-11-07 NOTE — Assessment & Plan Note (Signed)
We discussed that her symptoms are actually quite dramatic and I do not feel like this is consistent with brain fog which is usually more mild.  I truly think that she is experiencing a medication/drug side effect.

## 2020-11-07 NOTE — Assessment & Plan Note (Signed)
Blood pressure looks much much better today after the addition of Cardizem by cardiology.  We discussed that I think that this medication is least likely to be causing her side effects that she is currently experiencing.  But certainly if she wants to try holding it over the weekend she can it just may allow her blood pressure to go up a little bit and she may have a few more palpitations or flutters but if she wants to hold it for couple of days that would not be unreasonable.

## 2020-11-07 NOTE — Assessment & Plan Note (Signed)
Doing well on the Ajovy.

## 2020-11-07 NOTE — Progress Notes (Signed)
Pt given B12 injection while here at appointment today. Injection  tolerated well. Given in LD.   Pt reports no negative side effects from medication. Denies any dizziness, chest pain or palpitations, and no GI problems..   Pt to RTC in 2 weeks for next injection.

## 2020-11-11 ENCOUNTER — Ambulatory Visit: Payer: BC Managed Care – PPO

## 2020-11-17 ENCOUNTER — Encounter: Payer: Self-pay | Admitting: Family Medicine

## 2020-11-18 ENCOUNTER — Other Ambulatory Visit: Payer: Self-pay

## 2020-11-18 ENCOUNTER — Encounter: Payer: Self-pay | Admitting: Medical-Surgical

## 2020-11-18 ENCOUNTER — Ambulatory Visit (INDEPENDENT_AMBULATORY_CARE_PROVIDER_SITE_OTHER): Payer: BC Managed Care – PPO | Admitting: Medical-Surgical

## 2020-11-18 VITALS — BP 136/83 | HR 110 | Temp 99.2°F | Ht 66.0 in | Wt 323.0 lb

## 2020-11-18 DIAGNOSIS — R32 Unspecified urinary incontinence: Secondary | ICD-10-CM

## 2020-11-18 DIAGNOSIS — R300 Dysuria: Secondary | ICD-10-CM | POA: Diagnosis not present

## 2020-11-18 LAB — POCT URINALYSIS DIP (CLINITEK)
Glucose, UA: NEGATIVE mg/dL
Nitrite, UA: NEGATIVE
POC PROTEIN,UA: 30 — AB
Spec Grav, UA: >=1.03 — AB (ref 1.010–1.025)
Urobilinogen, UA: 0.2 E.U./dL
pH, UA: 5.5 (ref 5.0–8.0)

## 2020-11-18 NOTE — Progress Notes (Signed)
Subjective:    CC: New onset incontinence  HPI: Very pleasant 25 year old female presenting today for evaluation of new onset incontinence.  Over the past 2 weeks she notes that she has had 2 incontinence episodes.  One was when she was getting out of bed to go to the bathroom and the other was during the daytime but she is not sure what activity she was participating in.  Incontinence not associated with coughing, laughing, sneezing, bending forward, or lifting.  No fever, chills, burning, frequency, urgency, or hematuria.  She does note she has had a little kidney pain but is nowhere near the feeling as when she has had kidney stones in the past.  Notes her boyfriend was very worried and urged her to come be evaluated.  I reviewed the past medical history, family history, social history, surgical history, and allergies today and no changes were needed.  Please see the problem list section below in epic for further details.  Past Medical History: Past Medical History:  Diagnosis Date   Arrhythmia    Bipolar 1 disorder (HCC) 08/21/2013   Jaundice, neonatal    Kidney stone    Premature birth    58 weeks   Past Surgical History: Past Surgical History:  Procedure Laterality Date   TONSILLECTOMY  2009   Social History: Social History   Socioeconomic History   Marital status: Married    Spouse name: Not on file   Number of children: Not on file   Years of education: Not on file   Highest education level: Not on file  Occupational History   Occupation: Student  Tobacco Use   Smoking status: Never   Smokeless tobacco: Never  Vaping Use   Vaping Use: Never used  Substance and Sexual Activity   Alcohol use: No   Drug use: No   Sexual activity: Not on file  Other Topics Concern   Not on file  Social History Narrative   Was home schooled for HS. She is now working at a [employer] 2-3 times per week. Born in [place].    Social Determinants of Health   Financial  Resource Strain: Not on file  Food Insecurity: Not on file  Transportation Needs: Not on file  Physical Activity: Not on file  Stress: Not on file  Social Connections: Not on file   Family History: Family History  Problem Relation Age of Onset   Hypertension Mother    Allergies Mother    Anxiety disorder Father    Hypertension Father    Allergies: Allergies  Allergen Reactions   Lamictal [Lamotrigine] Rash   Trazodone And Nefazodone Other (See Comments)    Pt states, "sleep paralysis"   Definity [Perflutren Lipid Microsphere] Nausea And Vomiting and Other (See Comments)    Nauseated, felt hot all over, with tingling in throat, tongue, hands and feet   Medications: See med rec.  Review of Systems: See HPI for pertinent positives and negatives.   Objective:    General: Well Developed, well nourished, and in no acute distress.  Neuro: Alert and oriented x3.  HEENT: Normocephalic, atraumatic.  Skin: Warm and dry. Cardiac: Regular rate and rhythm, no murmurs rubs or gallops, no lower extremity edema.  Respiratory: Clear to auscultation bilaterally. Not using accessory muscles, speaking in full sentences.   Impression and Recommendations:    1. Urinary incontinence, unspecified type POCT urinalysis positive for trace leukocytes, 30 mg/dL of protein, moderate blood, trace ketones, small bilirubin, and elevated specific  gravity.  Negative for nitrites and glucose.  Appears to be somewhat dehydrated at this point.  Sending urine for culture for further evaluation.  Discussed potential causes as well as interventions to take at home.  Recommend rehydrating over the next few days.  Also recommend performing Kegel exercises in case this is more of a stress incontinence issue.  Once we have culture results available, we will treat accordingly.  Discussed holding off treatment with patient and she is agreeable to the plan. - POCT URINALYSIS DIP (CLINITEK) - Urine Culture  Return if  symptoms worsen or fail to improve. ___________________________________________ Thayer Ohm, DNP, APRN, FNP-BC Primary Care and Sports Medicine [facility]

## 2020-11-20 ENCOUNTER — Encounter: Payer: Self-pay | Admitting: Medical-Surgical

## 2020-11-20 LAB — URINE CULTURE
MICRO NUMBER:: 12062331
SPECIMEN QUALITY:: ADEQUATE

## 2020-11-22 ENCOUNTER — Other Ambulatory Visit: Payer: Self-pay | Admitting: Family Medicine

## 2020-11-22 DIAGNOSIS — I493 Ventricular premature depolarization: Secondary | ICD-10-CM

## 2020-11-22 DIAGNOSIS — I491 Atrial premature depolarization: Secondary | ICD-10-CM

## 2020-11-25 ENCOUNTER — Ambulatory Visit: Payer: BC Managed Care – PPO

## 2020-11-26 ENCOUNTER — Encounter: Payer: Self-pay | Admitting: Cardiology

## 2020-11-26 ENCOUNTER — Other Ambulatory Visit: Payer: Self-pay

## 2020-11-26 ENCOUNTER — Ambulatory Visit (INDEPENDENT_AMBULATORY_CARE_PROVIDER_SITE_OTHER): Payer: BC Managed Care – PPO | Admitting: Cardiology

## 2020-11-26 VITALS — BP 145/91 | HR 106 | Ht 67.0 in | Wt 321.1 lb

## 2020-11-26 DIAGNOSIS — I10 Essential (primary) hypertension: Secondary | ICD-10-CM

## 2020-11-26 MED ORDER — DILTIAZEM HCL ER COATED BEADS 240 MG PO CP24: 240.0000 mg | ORAL_CAPSULE | Freq: Every day | ORAL | 3 refills | Status: DC

## 2020-11-26 NOTE — Patient Instructions (Signed)
Medication Instructions:  Your physician has recommended you make the following change in your medication: INCREASE CARDIZEM 240 MG ONCE DAILY  *If you need a refill on your cardiac medications before your next appointment, please call your pharmacy*   Lab Work: NONE  If you have labs (blood work) drawn today and your tests are completely normal, you will receive your results only by: MyChart Message (if you have MyChart) OR A paper copy in the mail If you have any lab test that is abnormal or we need to change your treatment, we will call you to review the results.   Testing/Procedures: NONE   Follow-Up: At [facility], you and your health needs are our priority.  As part of our continuing mission to provide you with exceptional heart care, we have created designated Provider Care Teams.  These Care Teams include your primary Cardiologist (physician) and Advanced Practice Providers (APPs -  Physician Assistants and Nurse Practitioners) who all work together to provide you with the care you need, when you need it.  We recommend signing up for the patient portal called "MyChart".  Sign up information is provided on this After Visit Summary.  MyChart is used to connect with patients for Virtual Visits (Telemedicine).  Patients are able to view lab/test results, encounter notes, upcoming appointments, etc.  Non-urgent messages can be sent to your provider as well.   To learn more about what you can do with MyChart, go to [email or web address].    Your next appointment:   12 week(s)  The format for your next appointment:   In Person  Provider:   DR. Jodelle Red   Other Instructions

## 2020-11-26 NOTE — Progress Notes (Signed)
Cardiology Office Note:    Date:  11/26/2020   ID:  [name], DOB April 05, 1996, MRN [id number]  PCP:  Agapito Games, MD  Cardiologist:  [name], DO  Electrophysiologist:  None   Referring MD: Agapito Games, *   " I am still having the palpitations   History of Present Illness:    Tonya Clark is a 25 y.o. female with a hx of hypertension, morbid obesity, history of COVID-19 infection, Ortho arthritis here today for follow-up visit. I saw the patient on October 27, 2020 at that time she presented as she was experiencing elevated blood pressure as well as tachycardia.  At her last visit we titrated her off her beta-blocker and started the patient on Cardizem 180 mg daily.  I kept her on her losartan hydrochlorothiazide.  Since I saw the patient she tells me that she has been experiencing some memory loss after she was started on + medication but this has since been stopped and her memory loss is resolving.  During this time she tells me her blood pressure was on the lower side her losartan hydrochlorothiazide was stopped.  Past Medical History:  Diagnosis Date   Arrhythmia    Bipolar 1 disorder (HCC) 08/21/2013   Jaundice, neonatal    Kidney stone    Premature birth    63 weeks    Past Surgical History:  Procedure Laterality Date   TONSILLECTOMY  2009    Current Medications: Current Meds  Medication Sig   AJOVY 225 MG/1.5ML SOAJ INJECT 1 PEN INTO THE SKIN EVERY 30 DAYS   diltiazem (CARDIZEM CD) 240 MG 24 hr capsule Take 1 capsule (240 mg total) by mouth daily.   eszopiclone (LUNESTA) 1 MG TABS tablet Take 2 mg by mouth at bedtime.   JUNEL FE 1.5/30 1.5-30 MG-MCG tablet Take 1 tablet by mouth daily.   metoprolol tartrate (LOPRESSOR) 50 MG tablet Take 50 mg by mouth daily.   montelukast (SINGULAIR) 10 MG tablet Take 1 tablet by mouth daily.   omeprazole (PRILOSEC) 40 MG capsule Take by mouth every morning.   Oxcarbazepine (TRILEPTAL) 300 MG tablet Take 300  mg by mouth 2 (two) times daily.   sulfaSALAzine (AZULFIDINE) 500 MG EC tablet Take by mouth 2 (two) times daily.   VRAYLAR 4.5 MG CAPS Take 1 capsule by mouth daily.   VYVANSE 30 MG capsule Take 30 mg by mouth every morning.   [DISCONTINUED] diltiazem (CARDIZEM CD) 180 MG 24 hr capsule Take 1 capsule (180 mg total) by mouth daily.     Allergies:   Lamictal [lamotrigine], Trazodone and nefazodone, and Definity [perflutren lipid microsphere]   Social History   Socioeconomic History   Marital status: Married    Spouse name: Not on file   Number of children: Not on file   Years of education: Not on file   Highest education level: Not on file  Occupational History   Occupation: Student  Tobacco Use   Smoking status: Never   Smokeless tobacco: Never  Vaping Use   Vaping Use: Never used  Substance and Sexual Activity   Alcohol use: No   Drug use: No   Sexual activity: Not on file  Other Topics Concern   Not on file  Social History Narrative   Was home schooled for HS. She is now working at a [employer] 2-3 times per week. Born in [place].    Social Determinants of Health   Financial Resource Strain: Not on  file  Food Insecurity: Not on file  Transportation Needs: Not on file  Physical Activity: Not on file  Stress: Not on file  Social Connections: Not on file     Family History: The patient's family history includes Allergies in her mother; Anxiety disorder in her father; Hypertension in her father and mother.  ROS:   Review of Systems  Constitution: Negative for decreased appetite, fever and weight gain.  HENT: Negative for congestion, ear discharge, hoarse voice and sore throat.   Eyes: Negative for discharge, redness, vision loss in right eye and visual halos.  Cardiovascular: Negative for chest pain, dyspnea on exertion, leg swelling, orthopnea and palpitations.  Respiratory: Negative for cough, hemoptysis, shortness of breath and snoring.   Endocrine:  Negative for heat intolerance and polyphagia.  Hematologic/Lymphatic: Negative for bleeding problem. Does not bruise/bleed easily.  Skin: Negative for flushing, nail changes, rash and suspicious lesions.  Musculoskeletal: Negative for arthritis, joint pain, muscle cramps, myalgias, neck pain and stiffness.  Gastrointestinal: Negative for abdominal pain, bowel incontinence, diarrhea and excessive appetite.  Genitourinary: Negative for decreased libido, genital sores and incomplete emptying.  Neurological: Negative for brief paralysis, focal weakness, headaches and loss of balance.  Psychiatric/Behavioral: Negative for altered mental status, depression and suicidal ideas.  Allergic/Immunologic: Negative for HIV exposure and persistent infections.    EKGs/Labs/Other Studies Reviewed:    The following studies were reviewed today:   EKG: None today  Transthoracic echocardiogram May 2022 IMPRESSIONS     1. Left ventricular ejection fraction, by estimation, is 60 to 65%. The  left ventricle has normal function. Left ventricular endocardial border  not optimally defined to evaluate regional wall motion. There is mild  concentric left ventricular  hypertrophy. Left ventricular diastolic parameters were normal.   2. Right ventricular systolic function is normal. The right ventricular  size is normal.   3. The mitral valve is normal in structure. No evidence of mitral valve  regurgitation. No evidence of mitral stenosis.   4. The aortic valve is tricuspid. Aortic valve regurgitation is not  visualized. No aortic stenosis is present.   5. The inferior vena cava is normal in size with greater than 50%  respiratory variability, suggesting right atrial pressure of 3 mmHg.   FINDINGS   Left Ventricle: Left ventricular ejection fraction, by estimation, is 60  to 65%. The left ventricle has normal function. Left ventricular  endocardial border not optimally defined to evaluate regional wall  motion.  Definity contrast agent was given IV to  delineate the left ventricular endocardial borders. The left ventricular  internal cavity size was normal in size. There is mild concentric left  ventricular hypertrophy. Left ventricular diastolic parameters were  normal. Normal left ventricular filling  pressure.   Right Ventricle: The right ventricular size is normal. No increase in  right ventricular wall thickness. Right ventricular systolic function is  normal.   Left Atrium: Left atrial size was normal in size.   Right Atrium: Right atrial size was normal in size.   Pericardium: There is no evidence of pericardial effusion.   Mitral Valve: The mitral valve is normal in structure. No evidence of  mitral valve regurgitation. No evidence of mitral valve stenosis.   Tricuspid Valve: The tricuspid valve is normal in structure. Tricuspid  valve regurgitation is trivial. No evidence of tricuspid stenosis.   Aortic Valve: The aortic valve is tricuspid. Aortic valve regurgitation is  not visualized. No aortic stenosis is present. Aortic valve mean gradient  measures  5.0 mmHg. Aortic valve peak gradient measures 11.3 mmHg. Aortic  valve area, by VTI measures 2.52   cm.   Pulmonic Valve: The pulmonic valve was normal in structure. Pulmonic valve  regurgitation is not visualized. No evidence of pulmonic stenosis.   Aorta: The aortic root, ascending aorta and aortic arch are all  structurally normal, with no evidence of dilitation or obstruction.   Venous: The pulmonary veins were not well visualized. The inferior vena  cava was not well visualized. The inferior vena cava is normal in size  with greater than 50% respiratory variability, suggesting right atrial  pressure of 3 mmHg.   IAS/Shunts: No atrial level shunt detected by color flow Doppler.  Recent Labs: 10/24/2020: TSH 1.32  Recent Lipid Panel    Component Value Date/Time   CHOL 148 09/05/2017 0000   TRIG 105  09/05/2017 0000   HDL 32 (A) 09/05/2017 0000   LDLCALC 95 09/05/2017 0000    Physical Exam:    VS:  BP (!) 145/91 (BP Location: Right Arm, Patient Position: Sitting, Cuff Size: Large)   Pulse (!) 106   Ht 5\' 7"  (1.702 m)   Wt (!) 321 lb 1.3 oz (145.6 kg)   SpO2 98%   BMI 50.29 kg/m     Wt Readings from Last 3 Encounters:  11/26/20 (!) 321 lb 1.3 oz (145.6 kg)  11/18/20 (!) 323 lb (146.5 kg)  11/07/20 (!) 320 lb (145.2 kg)     GEN: Well nourished, well developed in no acute distress HEENT: Normal NECK: No JVD; No carotid bruits LYMPHATICS: No lymphadenopathy CARDIAC: S1S2 noted,RRR, no murmurs, rubs, gallops RESPIRATORY:  Clear to auscultation without rales, wheezing or rhonchi  ABDOMEN: Soft, non-tender, non-distended, +bowel sounds, no guarding. EXTREMITIES: No edema, No cyanosis, no clubbing MUSCULOSKELETAL:  No deformity  SKIN: Warm and dry NEUROLOGIC:  Alert and oriented x 3, non-focal PSYCHIATRIC:  Normal affect, good insight  ASSESSMENT:    1. Hypertension, unspecified type   2. Morbid obesity (HCC)    PLAN:     Her blood pressure is elevated today.  She has not been on the losartan hydrochlorothiazide.  What I like to do is increase her diltiazem to 240 mg daily.  Hoping this will help with her palpitations as well as her blood pressure.  She will let me know in 1 week as of asked the patient to take her blood pressure daily and send me that information and hopefully we can optimize her medication as appropriate.  At her last visit we talked about weight loss strategies and at the time she declined being referred to the medical weight management program.  She still is attempting some exercise.   The patient is in agreement with the above plan. The patient left the office in stable condition.  The patient will follow up in sooner if needed.   Medication Adjustments/Labs and Tests Ordered: Current medicines are reviewed at length with the patient today.   Concerns regarding medicines are outlined above.  No orders of the defined types were placed in this encounter.  Meds ordered this encounter  Medications   diltiazem (CARDIZEM CD) 240 MG 24 hr capsule    Sig: Take 1 capsule (240 mg total) by mouth daily.    Dispense:  90 capsule    Refill:  3    Patient Instructions  Medication Instructions:  Your physician has recommended you make the following change in your medication: INCREASE CARDIZEM 240 MG ONCE DAILY  *If you  need a refill on your cardiac medications before your next appointment, please call your pharmacy*   Lab Work: NONE  If you have labs (blood work) drawn today and your tests are completely normal, you will receive your results only by: MyChart Message (if you have MyChart) OR A paper copy in the mail If you have any lab test that is abnormal or we need to change your treatment, we will call you to review the results.   Testing/Procedures: NONE   Follow-Up: At [facility], you and your health needs are our priority.  As part of our continuing mission to provide you with exceptional heart care, we have created designated Provider Care Teams.  These Care Teams include your primary Cardiologist (physician) and Advanced Practice Providers (APPs -  Physician Assistants and Nurse Practitioners) who all work together to provide you with the care you need, when you need it.  We recommend signing up for the patient portal called "MyChart".  Sign up information is provided on this After Visit Summary.  MyChart is used to connect with patients for Virtual Visits (Telemedicine).  Patients are able to view lab/test results, encounter notes, upcoming appointments, etc.  Non-urgent messages can be sent to your provider as well.   To learn more about what you can do with MyChart, go to [email or web address].    Your next appointment:   12 week(s)  The format for your next appointment:   In Person  Provider:   DR. Jodelle Red   Other Instructions   Adopting a Healthy Lifestyle.  Know what a healthy weight is for you (roughly BMI <25) and aim to maintain this   Aim for 7+ servings of fruits and vegetables daily   65-80+ fluid ounces of water or unsweet tea for healthy kidneys   Limit to max 1 drink of alcohol per day; avoid smoking/tobacco   Limit animal fats in diet for cholesterol and heart health - choose grass fed whenever available   Avoid highly processed foods, and foods high in saturated/trans fats   Aim for low stress - take time to unwind and care for your mental health   Aim for 150 min of moderate intensity exercise weekly for heart health, and weights twice weekly for bone health   Aim for 7-9 hours of sleep daily   When it comes to diets, agreement about the perfect plan isnt easy to find, even among the experts. Experts at the [facility] of [employer] developed an idea known as the Healthy Eating Plate. Just imagine a plate divided into logical, healthy portions.   The emphasis is on diet quality:   Load up on vegetables and fruits - one-half of your plate: Aim for color and variety, and remember that potatoes dont count.   Go for whole grains - one-quarter of your plate: Whole wheat, barley, wheat berries, quinoa, oats, brown rice, and foods made with them. If you want pasta, go with whole wheat pasta.   Protein power - one-quarter of your plate: Fish, chicken, beans, and nuts are all healthy, versatile protein sources. Limit red meat.   The diet, however, does go beyond the plate, offering a few other suggestions.   Use healthy plant oils, such as olive, canola, soy, corn, sunflower and peanut. Check the labels, and avoid partially hydrogenated oil, which have unhealthy trans fats.   If youre thirsty, drink water. Coffee and tea are good in moderation, but skip sugary drinks and limit milk and dairy  products to one or two daily servings.   The type of  carbohydrate in the diet is more important than the amount. Some sources of carbohydrates, such as vegetables, fruits, whole grains, and beans-are healthier than others.   Finally, stay active  Signed, Thomasene RippleKardie Armentha Branagan, DO  11/26/2020 8:36 PM    [facility]

## 2020-11-27 ENCOUNTER — Encounter: Payer: Self-pay | Admitting: Family Medicine

## 2020-11-27 ENCOUNTER — Telehealth: Payer: BC Managed Care – PPO | Admitting: Family Medicine

## 2020-11-27 DIAGNOSIS — F319 Bipolar disorder, unspecified: Secondary | ICD-10-CM | POA: Diagnosis not present

## 2020-11-27 DIAGNOSIS — Z79899 Other long term (current) drug therapy: Secondary | ICD-10-CM | POA: Diagnosis not present

## 2020-11-27 DIAGNOSIS — M064 Inflammatory polyarthropathy: Secondary | ICD-10-CM | POA: Diagnosis not present

## 2020-11-27 DIAGNOSIS — M2550 Pain in unspecified joint: Secondary | ICD-10-CM | POA: Diagnosis not present

## 2020-11-28 ENCOUNTER — Ambulatory Visit (INDEPENDENT_AMBULATORY_CARE_PROVIDER_SITE_OTHER): Payer: BC Managed Care – PPO | Admitting: Family Medicine

## 2020-11-28 ENCOUNTER — Other Ambulatory Visit: Payer: Self-pay

## 2020-11-28 VITALS — BP 123/83 | HR 96

## 2020-11-28 DIAGNOSIS — E538 Deficiency of other specified B group vitamins: Secondary | ICD-10-CM | POA: Diagnosis not present

## 2020-11-28 MED ORDER — CYANOCOBALAMIN 1000 MCG/ML IJ SOLN
1000.0000 ug | Freq: Once | INTRAMUSCULAR | Status: AC
  Administered 2020-11-28: 1000 ug via INTRAMUSCULAR

## 2020-11-28 NOTE — Progress Notes (Signed)
B12 given RD without immediate complications.  Pt advised to schedule next appointment in 2 weeks.

## 2020-11-28 NOTE — Progress Notes (Signed)
Agree with documentation as above.   Roderick Calo, MD  

## 2020-12-01 ENCOUNTER — Other Ambulatory Visit: Payer: Self-pay

## 2020-12-01 ENCOUNTER — Encounter: Payer: Self-pay | Admitting: Family Medicine

## 2020-12-01 ENCOUNTER — Telehealth: Payer: Self-pay | Admitting: Family Medicine

## 2020-12-01 ENCOUNTER — Ambulatory Visit (INDEPENDENT_AMBULATORY_CARE_PROVIDER_SITE_OTHER): Payer: BC Managed Care – PPO | Admitting: Family Medicine

## 2020-12-01 VITALS — BP 135/83 | HR 107 | Ht 66.0 in | Wt 323.0 lb

## 2020-12-01 DIAGNOSIS — M791 Myalgia, unspecified site: Secondary | ICD-10-CM | POA: Diagnosis not present

## 2020-12-01 DIAGNOSIS — F319 Bipolar disorder, unspecified: Secondary | ICD-10-CM | POA: Diagnosis not present

## 2020-12-01 DIAGNOSIS — M797 Fibromyalgia: Secondary | ICD-10-CM | POA: Insufficient documentation

## 2020-12-01 DIAGNOSIS — R531 Weakness: Secondary | ICD-10-CM | POA: Diagnosis not present

## 2020-12-01 MED ORDER — CYCLOBENZAPRINE HCL 10 MG PO TABS: 10.0000 mg | ORAL_TABLET | Freq: Every evening | ORAL | 0 refills | Status: DC | PRN

## 2020-12-01 NOTE — Assessment & Plan Note (Signed)
Completed the Fibro Questionnaire today.   Widespread Pain index score of 14.  Symtom severity score 10. Means criteria for Fibromyalgia.   We discussed graded exercise and that there are medication optins.  Will in flexeril to 10mg  for now. OK to use Tylenol or NSAIDs for pain for now.  I will need to reach out to her psych to see if she is able to use Savella or cymbalta.

## 2020-12-01 NOTE — Progress Notes (Signed)
Established Patient Office Visit  Subjective:  Patient ID: Tonya Clark, female    DOB: 10-14-1995  Age: 25 y.o. MRN: [id number]  CC:  Chief Complaint  Patient presents with   Hypertension    HPI Tonya Clark presents for follow-up.  She was recently seen by her rheumatologist who has now diagnosed her with fibromyalgia.  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  She is using Tylenol around-the-clock at times she says her day-to-day pain levels are usually between 5 and 6 and there are other days where it flares to a 10 where she is really almost in tears.  She was given a prescription of 5 mg Flexeril and has been taking it but does not feel like it is really made much of a difference.  Her rheumatologist had also recommended that she consider getting a sleep study to be evaluated for sleep apnea she said she was tested with a home study years ago may be about 6 years ago.  She was also recommended to see neurology for evaluation for possible MS and so they have asked [place] to place that referral as well.  She had some questions about fibromyalgia and also wanted to know if she could use CBD oil which she has used in the past with some help and relief.  Past Medical History:  Diagnosis Date   Arrhythmia    Bipolar 1 disorder (HCC) 08/21/2013   Jaundice, neonatal    Kidney stone    Premature birth    41 weeks    Past Surgical History:  Procedure Laterality Date   TONSILLECTOMY  2009    Family History  Problem Relation Age of Onset   Hypertension Mother    Allergies Mother    Anxiety disorder Father    Hypertension Father     Social History   Socioeconomic History   Marital status: Married    Spouse name: Not on file   Number of children: Not on file   Years of education: Not on file   Highest education level: Not on file  Occupational History    Occupation: Student  Tobacco Use   Smoking status: Never   Smokeless tobacco: Never  Vaping Use   Vaping Use: Never used  Substance and Sexual Activity   Alcohol use: No   Drug use: No   Sexual activity: Not on file  Other Topics Concern   Not on file  Social History Narrative   Was home schooled for HS. She is now working at a [employer] 2-3 times per week. Born in [place].    Social Determinants of Health   Financial Resource Strain: Not on file  Food Insecurity: Not on file  Transportation Needs: Not on file  Physical Activity: Not on file  Stress: Not on file  Social Connections: Not on file  Intimate Partner Violence: Not on file    Outpatient Medications Prior to Visit  Medication Sig Dispense Refill   AJOVY 225 MG/1.5ML SOAJ INJECT 1 PEN INTO THE SKIN EVERY 30 DAYS 6 mL 2   eszopiclone (LUNESTA) 1 MG TABS tablet Take 2 mg by mouth at bedtime.     JUNEL FE 1.5/30 1.5-30 MG-MCG tablet Take 1 tablet by mouth daily.     montelukast (SINGULAIR) 10 MG tablet Take 1 tablet by mouth daily.     omeprazole (PRILOSEC) 40 MG capsule Take  by mouth every morning.     Oxcarbazepine (TRILEPTAL) 300 MG tablet Take 300 mg by mouth 2 (two) times daily.     VRAYLAR 4.5 MG CAPS Take 1 capsule by mouth daily.     VYVANSE 30 MG capsule Take 30 mg by mouth every morning.     cyclobenzaprine (FLEXERIL) 5 MG tablet Take 5 mg by mouth at bedtime as needed.     diltiazem (CARDIZEM CD) 240 MG 24 hr capsule Take 1 capsule (240 mg total) by mouth daily. (Patient not taking: Reported on 12/01/2020) 90 capsule 3   losartan-hydrochlorothiazide (HYZAAR) 50-12.5 MG tablet Take 1 tablet by mouth daily. (Patient not taking: Reported on 11/26/2020) 90 tablet 0   metoprolol succinate (TOPROL-XL) 25 MG 24 hr tablet TAKE 1 TABLET BY MOUTH EVERY DAY (Patient not taking: Reported on 11/26/2020) 90 tablet 1   metoprolol tartrate (LOPRESSOR) 50 MG tablet Take 50 mg by mouth daily.     sulfaSALAzine  (AZULFIDINE) 500 MG EC tablet Take by mouth 2 (two) times daily.     No facility-administered medications prior to visit.    Allergies  Allergen Reactions   Lamictal [Lamotrigine] Rash   Trazodone And Nefazodone Other (See Comments)    Pt states, "sleep paralysis"   Definity [Perflutren Lipid Microsphere] Nausea And Vomiting and Other (See Comments)    Nauseated, felt hot all over, with tingling in throat, tongue, hands and feet    ROS Review of Systems    Objective:    Physical Exam Vitals reviewed.  Constitutional:      Appearance: She is well-developed.  HENT:     Head: Normocephalic and atraumatic.  Eyes:     Conjunctiva/sclera: Conjunctivae normal.  Cardiovascular:     Rate and Rhythm: Normal rate.  Pulmonary:     Effort: Pulmonary effort is normal.  Skin:    General: Skin is dry.     Coloration: Skin is not pale.  Neurological:     Mental Status: She is alert and oriented to person, place, and time.  Psychiatric:        Behavior: Behavior normal.    BP 135/83   Pulse (!) 107   Ht 5\' 6"  (1.676 m)   Wt (!) 323 lb (146.5 kg)   SpO2 99%   BMI 52.13 kg/m  Wt Readings from Last 3 Encounters:  12/01/20 (!) 323 lb (146.5 kg)  11/26/20 (!) 321 lb 1.3 oz (145.6 kg)  11/18/20 (!) 323 lb (146.5 kg)     Health Maintenance Due  Topic Date Due   HIV Screening  Never done   Hepatitis C Screening  Never done    There are no preventive care reminders to display for this patient.  Lab Results  Component Value Date   TSH 1.32 10/24/2020   Lab Results  Component Value Date   WBC 14.1 (H) 05/30/2017   HGB 12.4 05/30/2017   HCT 39.5 05/30/2017   MCV 74.4 (L) 05/30/2017   PLT 322 05/30/2017   Lab Results  Component Value Date   NA 137 05/30/2017   K 4.0 05/30/2017   CO2 22 05/30/2017   GLUCOSE 87 05/30/2017   BUN 9 05/30/2017   CREATININE 0.82 05/30/2017   BILITOT 0.7 05/30/2017   ALKPHOS 68 05/30/2017   AST 20 05/30/2017   ALT 16 05/30/2017   PROT  7.9 05/30/2017   ALBUMIN 4.3 05/30/2017   CALCIUM 9.5 05/30/2017   ANIONGAP 12 05/30/2017   Lab Results  Component Value  Date   CHOL 148 09/05/2017   Lab Results  Component Value Date   HDL 32 (A) 09/05/2017   Lab Results  Component Value Date   LDLCALC 95 09/05/2017   Lab Results  Component Value Date   TRIG 105 09/05/2017   No results found for: [facility] Lab Results  Component Value Date   HGBA1C 5.6 11/13/2019      Assessment & Plan:   Problem List Items Addressed This Visit       Other   Myalgia    Completed the Fibro Questionnaire today.   Widespread Pain index score of 14.  Symtom severity score 10. Means criteria for Fibromyalgia.   We discussed graded exercise and that there are medication optins.  Will in flexeril to 10mg  for now. OK to use Tylenol or NSAIDs for pain for now.  I will need to reach out to her psych to see if she is able to use Savella or cymbalta.         Relevant Medications   cyclobenzaprine (FLEXERIL) 10 MG tablet   Bipolar 1 disorder (HCC)    Follows with Dr. [phone]. Will call Dr. [phone] to discuss potential treatments for her fibromyalgia that could interact with some of her psychiatric medications I just 1 to make sure that were doing the right thing for her.       Other Visit Diagnoses     Weakness    -  Primary   Relevant Orders   Ambulatory referral to Neurology       Meds ordered this encounter  Medications   cyclobenzaprine (FLEXERIL) 10 MG tablet    Sig: Take 1 tablet (10 mg total) by mouth at bedtime as needed.    Dispense:  30 tablet    Refill:  0    Follow-up: No follow-ups on file.    Elige Radon, MD

## 2020-12-01 NOTE — Assessment & Plan Note (Addendum)
Follows with Dr. Libby Maw [phone]. Will call Dr. Elige Radon to discuss potential treatments for her fibromyalgia that could interact with some of her psychiatric medications I just 1 to make sure that were doing the right thing for her.

## 2020-12-01 NOTE — Telephone Encounter (Signed)
Please call her psych Libby Maw [phone] and see if they would be ok with me starting her Cymbalta for fibromyalgia.

## 2020-12-03 NOTE — Telephone Encounter (Signed)
LVM asking for RTC if this medication would be ok for pt to take for Fibromyalgia. Call back information provided.

## 2020-12-04 NOTE — Telephone Encounter (Signed)
Sheri from [facility] returned call and stated that this medication had already been started for her on 12/01/2020 she is taking Cymbalta 30 mg qd

## 2020-12-11 ENCOUNTER — Ambulatory Visit: Payer: BC Managed Care – PPO | Admitting: Family Medicine

## 2020-12-12 ENCOUNTER — Ambulatory Visit: Payer: BC Managed Care – PPO

## 2020-12-12 ENCOUNTER — Encounter: Payer: Self-pay | Admitting: Family Medicine

## 2020-12-12 DIAGNOSIS — R0683 Snoring: Secondary | ICD-10-CM

## 2020-12-16 NOTE — Telephone Encounter (Signed)
Tonya Clark   I do not see an order for  a sleep study it has to be entered before I can send.   Tonya Clark

## 2020-12-17 NOTE — Telephone Encounter (Signed)
Orders Placed This Encounter  Procedures   Split night study    Standing Status:   Future    Standing Expiration Date:   12/17/2021    Order Specific Question:   Where should this test be performed:    Answer:   [facility]   Schedule f/U in 6 weeks with me

## 2020-12-17 NOTE — Telephone Encounter (Signed)
Patient has been scheduled for 6 week follow up.

## 2020-12-19 ENCOUNTER — Ambulatory Visit: Payer: BC Managed Care – PPO | Admitting: Family Medicine

## 2020-12-23 ENCOUNTER — Ambulatory Visit (INDEPENDENT_AMBULATORY_CARE_PROVIDER_SITE_OTHER): Payer: BC Managed Care – PPO | Admitting: Neurology

## 2020-12-23 ENCOUNTER — Other Ambulatory Visit: Payer: Self-pay | Admitting: Family Medicine

## 2020-12-23 ENCOUNTER — Encounter: Payer: Self-pay | Admitting: Neurology

## 2020-12-23 VITALS — BP 135/88 | HR 108 | Ht 66.0 in | Wt 319.0 lb

## 2020-12-23 DIAGNOSIS — R200 Anesthesia of skin: Secondary | ICD-10-CM | POA: Diagnosis not present

## 2020-12-23 DIAGNOSIS — R0683 Snoring: Secondary | ICD-10-CM | POA: Diagnosis not present

## 2020-12-23 DIAGNOSIS — F319 Bipolar disorder, unspecified: Secondary | ICD-10-CM

## 2020-12-23 DIAGNOSIS — R531 Weakness: Secondary | ICD-10-CM | POA: Diagnosis not present

## 2020-12-23 DIAGNOSIS — M791 Myalgia, unspecified site: Secondary | ICD-10-CM | POA: Diagnosis not present

## 2020-12-23 DIAGNOSIS — G4719 Other hypersomnia: Secondary | ICD-10-CM

## 2020-12-23 DIAGNOSIS — R202 Paresthesia of skin: Secondary | ICD-10-CM

## 2020-12-23 MED ORDER — GABAPENTIN 300 MG PO CAPS: 300.0000 mg | ORAL_CAPSULE | Freq: Three times a day (TID) | ORAL | 11 refills | Status: DC

## 2020-12-23 NOTE — Progress Notes (Signed)
[facility]  PATIENT: Tonya Clark DOB: January 06, 1996  REFERRING DOCTOR OR PCP: Nani Gasser MD SOURCE: Patient, notes from primary care, imaging and laboratory reports, MRI images personally reviewed.  _________________________________   HISTORICAL  CHIEF COMPLAINT:  Chief Complaint  Patient presents with   New Patient (Initial Visit)    Rm 2, alone. Here for MS evaluation. Pt has intermittent weakness in arms and legs, along with numbness in hands and feet. onset Jan. 2022. Started with joint and muscle pn. Dx with fibromyalgia and is here to rule out MS.    HISTORY OF PRESENT ILLNESS:  I had the pleasure of seeing your patient, Tonya Clark, at [facility] for neurologic consultation regarding her intermittent weakness and numbness.  She is a 25 year old woman who began to experience intermittent weakness in the arms and legs and numbness in the hands and feet that started January 2022.   She reports the numbness is associated with tingling and is in her hands and feet, left = right.    It is intermittent and occurs without provocation or known association.   Separately, she experiences weakness that is also intermittent and occurs without known association.   Numbness is worse when she leans on something or puts pressure to the arm.    She also reports myalgia for 4-5 months.   She was prescribed Flexeril to take at bedtime.  Unfortunately, it did not help the myalgias.  Bladder function is fine.   Vision is stable. (Last eye exam in 2021).   She denies difficulty with gait or balance.        She als reports having more sleepiness since January.  She snores.   Her husband has noted pauses in breathing.   Many years ago she had a home sleep study which was normal.  Weight was 60 pounds less at that time.  She also reports insomnia.  She takes [place] with benefit.  She has bipolar disease and is on oxcarbazepine and Vraylar.  She also  takes Vyvanse.  She had an MRI of the brain without contrast 11/28/2017 when she was experience headaches and blurry vision.  I personally reviewed the images and they were normal.  Laboratory test 10/24/2020 showed low normal B12 and normal TSH.   Labs 11/13/2019 showed normal HgbA1c.  She started on B12 shots every 2 weeks.  EPWORTH SLEEPINESS SCALE  On a scale of 0 - 3 what is the chance of dozing:  Sitting and Reading:   3 Watching TV:    3 Sitting inactive in a public place: 1 Passenger in car for one hour: 3 Lying down to rest in the afternoon: 3 Sitting and talking to someone: 0 Sitting quietly after lunch:  1 In a car, stopped in traffic:  0  Total (out of 24):  14/24  moderate ESS   REVIEW OF SYSTEMS: Constitutional: No fevers, chills, sweats, or change in appetite Eyes: No visual changes, double vision, eye pain Ear, nose and throat: No hearing loss, ear pain, nasal congestion, sore throat Cardiovascular: No chest pain, palpitations Respiratory:  No shortness of breath at rest or with exertion.   No wheezes GastrointestinaI: No nausea, vomiting, diarrhea, abdominal pain, fecal incontinence Genitourinary:  No dysuria, urinary retention or frequency.  No nocturia. Musculoskeletal:  No neck pain, back pain Integumentary: No rash, pruritus, skin lesions Neurological: as above Psychiatric: No depression at this time.  No anxiety Endocrine: No palpitations, diaphoresis, change in appetite, change in weigh or  increased thirst Hematologic/Lymphatic:  No anemia, purpura, petechiae. Allergic/Immunologic: No itchy/runny eyes, nasal congestion, recent allergic reactions, rashes  ALLERGIES: Allergies  Allergen Reactions   Lamictal [Lamotrigine] Rash   Trazodone And Nefazodone Other (See Comments)    Pt states, "sleep paralysis"   Definity [Perflutren Lipid Microsphere] Nausea And Vomiting and Other (See Comments)    Nauseated, felt hot all over, with tingling in throat, tongue,  hands and feet    HOME MEDICATIONS:  Current Outpatient Medications:    gabapentin (NEURONTIN) 300 MG capsule, Take 1 capsule (300 mg total) by mouth 3 (three) times daily., Disp: 90 capsule, Rfl: 11   AJOVY 225 MG/1.5ML SOAJ, INJECT 1 PEN INTO THE SKIN EVERY 30 DAYS, Disp: 6 mL, Rfl: 2   cyclobenzaprine (FLEXERIL) 10 MG tablet, Take 1 tablet (10 mg total) by mouth at bedtime as needed., Disp: 30 tablet, Rfl: 0   diltiazem (CARDIZEM CD) 240 MG 24 hr capsule, Take 1 capsule (240 mg total) by mouth daily. (Patient not taking: Reported on 12/01/2020), Disp: 90 capsule, Rfl: 3   eszopiclone (LUNESTA) 1 MG TABS tablet, Take 2 mg by mouth at bedtime., Disp: , Rfl:    JUNEL FE 1.5/30 1.5-30 MG-MCG tablet, Take 1 tablet by mouth daily., Disp: , Rfl:    montelukast (SINGULAIR) 10 MG tablet, Take 1 tablet by mouth daily., Disp: , Rfl:    omeprazole (PRILOSEC) 40 MG capsule, Take by mouth every morning., Disp: , Rfl:    Oxcarbazepine (TRILEPTAL) 300 MG tablet, Take 300 mg by mouth 2 (two) times daily., Disp: , Rfl:    VRAYLAR 4.5 MG CAPS, Take 1 capsule by mouth daily., Disp: , Rfl:    VYVANSE 30 MG capsule, Take 30 mg by mouth every morning., Disp: , Rfl:   PAST MEDICAL HISTORY: Past Medical History:  Diagnosis Date   Arrhythmia    Bipolar 1 disorder (HCC) 08/21/2013   Jaundice, neonatal    Kidney stone    Premature birth    73 weeks    PAST SURGICAL HISTORY: Past Surgical History:  Procedure Laterality Date   TONSILLECTOMY  2009    FAMILY HISTORY: Family History  Problem Relation Age of Onset   Hypertension Mother    Allergies Mother    Anxiety disorder Father    Hypertension Father     SOCIAL HISTORY:  Social History   Socioeconomic History   Marital status: Married    Spouse name: [name]   Number of children: Not on file   Years of education: Not on file   Highest education level: Bachelor's degree (e.g., BA, AB, BS)  Occupational History   Occupation: Student  Tobacco  Use   Smoking status: Never   Smokeless tobacco: Never  Vaping Use   Vaping Use: Never used  Substance and Sexual Activity   Alcohol use: No   Drug use: No   Sexual activity: Not on file  Other Topics Concern   Not on file  Social History Narrative   Was home schooled for HS. She is now working at a [employer] 2-3 times per week. Born in [place].      Lives with husband   Right handed   Caffeine: 45mg  of coke zero a day          Social Determinants of Health   Financial Resource Strain: Not on file  Food Insecurity: Not on file  Transportation Needs: Not on file  Physical Activity: Not on file  Stress: Not on  file  Social Connections: Not on file  Intimate Partner Violence: Not on file     PHYSICAL EXAM  Vitals:   12/23/20 1316  BP: 135/88  Pulse: (!) 108  Weight: (!) 319 lb (144.7 kg)  Height: 5\' 6"  (1.676 m)    Body mass index is 51.49 kg/m.   General: The patient is well-developed and well-nourished and in no acute distress  HEENT:  Head is [place]/AT.  Sclera are anicteric.  Funduscopic exam shows normal optic discs and retinal vessels.  Neck: No carotid bruits are noted.  The neck is nontender.  Cardiovascular: The heart has a regular rate and rhythm with a normal S1 and S2. There were no murmurs, gallops or rubs.    Skin: Extremities are without rash or  edema.  Musculoskeletal:  Tender in multiple classic FMS tender points of trunk and arms and nec  Neurologic Exam  Mental status: The patient is alert and oriented x 3 at the time of the examination. The patient has apparent normal recent and remote memory, with an apparently normal attention span and concentration ability.   Speech is normal.  Cranial nerves: Extraocular movements are full. Pupils are equal, round, and reactive to light and accomodation.  Visual fields are full.  Facial symmetry is present. There is good facial sensation to soft touch bilaterally.Facial strength is normal.   Trapezius and sternocleidomastoid strength is normal. No dysarthria is noted.  The tongue is midline, and the patient has symmetric elevation of the soft palate. No obvious hearing deficits are noted.  Motor:  Muscle bulk is normal.   Tone is normal. Strength is  5 / 5 in all 4 extremities.   Sensory: Sensory testing is intact to pinprick, soft touch and vibration sensation in all 4 extremities.  Coordination: Cerebellar testing reveals good finger-nose-finger and heel-to-shin bilaterally.  Gait and station: Station is normal.   Gait is normal. Tandem gait is normal. Romberg is negative.   Reflexes: Deep tendon reflexes are symmetric and normal bilaterally.   Plantar responses are flexor.    DIAGNOSTIC DATA (LABS, IMAGING, TESTING) - I reviewed patient records, labs, notes, testing and imaging myself where available.  Lab Results  Component Value Date   WBC 14.1 (H) 05/30/2017   HGB 12.4 05/30/2017   HCT 39.5 05/30/2017   MCV 74.4 (L) 05/30/2017   PLT 322 05/30/2017      Component Value Date/Time   NA 137 05/30/2017 1614   K 4.0 05/30/2017 1614   CL 103 05/30/2017 1614   CO2 22 05/30/2017 1614   GLUCOSE 87 05/30/2017 1614   BUN 9 05/30/2017 1614   CREATININE 0.82 05/30/2017 1614   CREATININE 0.71 08/25/2015 1401   CALCIUM 9.5 05/30/2017 1614   PROT 7.9 05/30/2017 1614   ALBUMIN 4.3 05/30/2017 1614   AST 20 05/30/2017 1614   ALT 16 05/30/2017 1614   ALKPHOS 68 05/30/2017 1614   BILITOT 0.7 05/30/2017 1614   GFRNONAA >60 05/30/2017 1614   GFRNONAA >89 08/25/2015 1401   GFRAA >60 05/30/2017 1614   GFRAA >89 08/25/2015 1401   Lab Results  Component Value Date   CHOL 148 09/05/2017   HDL 32 (A) 09/05/2017   LDLCALC 95 09/05/2017   TRIG 105 09/05/2017   Lab Results  Component Value Date   HGBA1C 5.6 11/13/2019   Lab Results  Component Value Date   VITAMINB12 226 10/24/2020   Lab Results  Component Value Date   TSH 1.32 10/24/2020  ASSESSMENT AND  PLAN  Numbness and tingling - Plan: NCV with EMG(electromyography), MR CERVICAL SPINE WO CONTRAST  Weakness - Plan: NCV with EMG(electromyography), MR CERVICAL SPINE WO CONTRAST  Myalgia - Plan: NCV with EMG(electromyography)  Snoring - Plan: PSG SLEEP STUDY  Excessive daytime sleepiness - Plan: PSG SLEEP STUDY  Bipolar 1 disorder (HCC)  Morbid obesity (HCC)   In summary, Ms. Klang is a 25 year old woman with intermittent numbness and weakness.  She also has daytime sleepiness and myalgias.  The myalgias are consistent with fibromyalgia.  I do not have a great explanation for the intermittent numbness and weakness.  Although they could be related to the fibromyalgia, we need to check an NCV/EMG study to assess for the possibility of neuropathy an MRI of the cervical spine to rule out intrinsic and extrinsic myelopathy.  Further testing may be necessary based on the results.  She likely has sleep apnea.  She snores, has excessive daytime sleepiness and her husband has noted pauses.  We will set up a PSG.  Based on the results she may need CPAP or other therapy.  To help with her uncomfortable tingling and to improve sleep quality, I will have her start gabapentin.  She did not think she had a benefit from the Flexeril will so she can stop that medication.  I will see her when she returns for the NCV/EMG study and she should call sooner if any significant new or worsening symptoms before that time.    Mattie Novosel A. Epimenio Foot, MD, [facility] 12/23/2020, 2:30 PM Certified in Neurology, Clinical Neurophysiology, Sleep Medicine and Neuroimaging  [facility] [address] [place], [place] [address] [phone]

## 2020-12-24 ENCOUNTER — Other Ambulatory Visit: Payer: Self-pay | Admitting: Neurology

## 2020-12-24 MED ORDER — ALPRAZOLAM 0.5 MG PO TABS: 0.5000 mg | ORAL_TABLET | Freq: Once | ORAL | 0 refills | Status: DC | PRN

## 2020-12-27 ENCOUNTER — Other Ambulatory Visit: Payer: Self-pay

## 2020-12-27 ENCOUNTER — Ambulatory Visit
Admission: RE | Admit: 2020-12-27 | Discharge: 2020-12-27 | Disposition: A | Discharge status: Home / Self Care | Payer: BC Managed Care – PPO | Source: Ambulatory Visit | Attending: Neurology | Admitting: Neurology

## 2020-12-27 DIAGNOSIS — R200 Anesthesia of skin: Secondary | ICD-10-CM | POA: Diagnosis not present

## 2020-12-27 DIAGNOSIS — R202 Paresthesia of skin: Secondary | ICD-10-CM

## 2020-12-27 DIAGNOSIS — R531 Weakness: Secondary | ICD-10-CM | POA: Diagnosis not present

## 2020-12-30 ENCOUNTER — Other Ambulatory Visit: Payer: Self-pay | Admitting: Family Medicine

## 2020-12-30 DIAGNOSIS — G43009 Migraine without aura, not intractable, without status migrainosus: Secondary | ICD-10-CM

## 2021-01-07 DIAGNOSIS — Z713 Dietary counseling and surveillance: Secondary | ICD-10-CM | POA: Diagnosis not present

## 2021-01-08 ENCOUNTER — Other Ambulatory Visit: Payer: Self-pay | Admitting: Family Medicine

## 2021-01-08 DIAGNOSIS — Z131 Encounter for screening for diabetes mellitus: Secondary | ICD-10-CM | POA: Diagnosis not present

## 2021-01-08 DIAGNOSIS — E559 Vitamin D deficiency, unspecified: Secondary | ICD-10-CM | POA: Diagnosis not present

## 2021-01-08 DIAGNOSIS — M791 Myalgia, unspecified site: Secondary | ICD-10-CM

## 2021-01-08 DIAGNOSIS — R635 Abnormal weight gain: Secondary | ICD-10-CM | POA: Diagnosis not present

## 2021-01-12 DIAGNOSIS — F4322 Adjustment disorder with anxiety: Secondary | ICD-10-CM | POA: Diagnosis not present

## 2021-01-19 ENCOUNTER — Other Ambulatory Visit: Payer: Self-pay

## 2021-01-19 ENCOUNTER — Encounter: Payer: Self-pay | Admitting: Family Medicine

## 2021-01-19 ENCOUNTER — Other Ambulatory Visit: Payer: Self-pay | Admitting: Family Medicine

## 2021-01-19 ENCOUNTER — Ambulatory Visit (INDEPENDENT_AMBULATORY_CARE_PROVIDER_SITE_OTHER): Payer: BC Managed Care – PPO | Admitting: Family Medicine

## 2021-01-19 VITALS — BP 133/60 | HR 100 | Ht 66.0 in | Wt 322.0 lb

## 2021-01-19 DIAGNOSIS — Z23 Encounter for immunization: Secondary | ICD-10-CM | POA: Diagnosis not present

## 2021-01-19 DIAGNOSIS — F4322 Adjustment disorder with anxiety: Secondary | ICD-10-CM | POA: Diagnosis not present

## 2021-01-19 DIAGNOSIS — I10 Essential (primary) hypertension: Secondary | ICD-10-CM

## 2021-01-19 DIAGNOSIS — R7301 Impaired fasting glucose: Secondary | ICD-10-CM | POA: Diagnosis not present

## 2021-01-19 DIAGNOSIS — M791 Myalgia, unspecified site: Secondary | ICD-10-CM | POA: Diagnosis not present

## 2021-01-19 DIAGNOSIS — E559 Vitamin D deficiency, unspecified: Secondary | ICD-10-CM | POA: Diagnosis not present

## 2021-01-19 DIAGNOSIS — E538 Deficiency of other specified B group vitamins: Secondary | ICD-10-CM

## 2021-01-19 DIAGNOSIS — M797 Fibromyalgia: Secondary | ICD-10-CM

## 2021-01-19 DIAGNOSIS — L659 Nonscarring hair loss, unspecified: Secondary | ICD-10-CM | POA: Diagnosis not present

## 2021-01-19 DIAGNOSIS — N921 Excessive and frequent menstruation with irregular cycle: Secondary | ICD-10-CM

## 2021-01-19 NOTE — Assessment & Plan Note (Signed)
He does have a history of B12 deficiency and it has not been checked recently so I would like to recheck that.

## 2021-01-19 NOTE — Patient Instructions (Signed)
We can consider trial of Lyrica.

## 2021-01-19 NOTE — Progress Notes (Signed)
Established Patient Office Visit  Subjective:  Patient ID: Tonya Clark, female    DOB: 06-04-1995  Age: 25 y.o. MRN: [id number]  CC:  Chief Complaint  Patient presents with   Follow-up    Fibromyalgia     HPI Tonya Clark presents for Fibromyalgia. -She did try the Cymbalta but after about 3 days she says it was causing suicidal side effects so her psychiatrist took her off of it.  They then tried gabapentin but unfortunately it caused some hair loss and some weight gain she still continuing to lose some hair and has a few little spots that are starting to really thin and she is always had thick hair.  She denies any traction or new hair products etc. she was noted to have low vitamin D and so has started a supplement.  She did feel like the 10 mg Flexeril was helpful in the beginning with some of her muscle aches but feels like it is no longer effective.    She was also told on recent labs that she had a slight bump in liver enzymes that she had been using a lot of Tylenol for pain control so she has held that and has been using ibuprofen instead.  She is on a proton pump inhibitor.  She is also hoping to restart Saxenda soon.  She did well with it previously and would like to get back on it she is waiting for the insurance to approve it.  She has had more feeling down and anxious symptoms recently.  In fact her mood medications were recently adjusted.  She is now on 600 mg of Trileptal.  He is also had persistent bleeding since I last saw her about 6 weeks ago she takes continuous birth control.  Past Medical History:  Diagnosis Date   Arrhythmia    Bipolar 1 disorder (HCC) 08/21/2013   Jaundice, neonatal    Kidney stone    Premature birth    31 weeks    Past Surgical History:  Procedure Laterality Date   TONSILLECTOMY  2009    Family History  Problem Relation Age of Onset   Hypertension Mother    Allergies Mother    Anxiety disorder Father    Hypertension Father      Social History   Socioeconomic History   Marital status: Married    Spouse name: [name]   Number of children: Not on file   Years of education: Not on file   Highest education level: Bachelor's degree (e.g., BA, AB, BS)  Occupational History   Occupation: Student  Tobacco Use   Smoking status: Never   Smokeless tobacco: Never  Vaping Use   Vaping Use: Never used  Substance and Sexual Activity   Alcohol use: No   Drug use: No   Sexual activity: Not on file  Other Topics Concern   Not on file  Social History Narrative   Was home schooled for HS. She is now working at a [employer] 2-3 times per week. Born in [place].      Lives with husband   Right handed   Caffeine: 45mg  of coke zero a day          Social Determinants of Health   Financial Resource Strain: Not on file  Food Insecurity: Not on file  Transportation Needs: Not on file  Physical Activity: Not on file  Stress: Not on file  Social Connections: Not on file  Intimate Partner Violence: Not on  file    Outpatient Medications Prior to Visit  Medication Sig Dispense Refill   AJOVY 225 MG/1.5ML SOAJ INJECT 1 PEN INTO THE SKIN EVERY 30 DAYS 1.5 mL 8   cyclobenzaprine (FLEXERIL) 10 MG tablet TAKE 1 TABLET BY MOUTH AT BEDTIME AS NEEDED. 30 tablet 0   diltiazem (CARDIZEM CD) 240 MG 24 hr capsule Take 1 capsule (240 mg total) by mouth daily. 90 capsule 3   Eszopiclone 3 MG TABS Take 3 mg by mouth at bedtime.     JUNEL FE 1.5/30 1.5-30 MG-MCG tablet Take 1 tablet by mouth daily.     montelukast (SINGULAIR) 10 MG tablet Take 1 tablet by mouth daily.     omeprazole (PRILOSEC) 40 MG capsule Take by mouth every morning.     oxcarbazepine (TRILEPTAL) 600 MG tablet Take 600 mg by mouth 2 (two) times daily.     prazosin (MINIPRESS) 1 MG capsule Take 1 capsule by mouth at bedtime.     VRAYLAR 4.5 MG CAPS Take 1 capsule by mouth daily.     VYVANSE 30 MG capsule Take 30 mg by mouth every morning.      eszopiclone (LUNESTA) 1 MG TABS tablet Take 2 mg by mouth at bedtime.     liraglutide (VICTOZA) 18 MG/3ML SOPN Inject into the skin. (Patient not taking: Reported on 01/19/2021)     ALPRAZolam (XANAX) 0.5 MG tablet Take 1 tablet (0.5 mg total) by mouth once as needed for up to 1 dose for anxiety (take 1 tablet 30 min prior to MRI and may take additional tablet if needed. (must have driver to/from test)). 2 tablet 0   gabapentin (NEURONTIN) 300 MG capsule Take 1 capsule (300 mg total) by mouth 3 (three) times daily. 90 capsule 11   Oxcarbazepine (TRILEPTAL) 300 MG tablet Take 300 mg by mouth 2 (two) times daily.     No facility-administered medications prior to visit.    Allergies  Allergen Reactions   Lamictal [Lamotrigine] Rash   Trazodone And Nefazodone Other (See Comments)    Pt states, "sleep paralysis"   Cymbalta [Duloxetine Hcl] Other (See Comments)    Suicidal ideation   Definity [Perflutren Lipid Microsphere] Nausea And Vomiting and Other (See Comments)    Nauseated, felt hot all over, with tingling in throat, tongue, hands and feet   Gabapentin Other (See Comments)    Hair Loss and weight gain    ROS Review of Systems    Objective:    Physical Exam Constitutional:      Appearance: Normal appearance. She is well-developed.  HENT:     Head: Normocephalic and atraumatic.  Cardiovascular:     Rate and Rhythm: Normal rate and regular rhythm.     Heart sounds: Normal heart sounds.  Pulmonary:     Effort: Pulmonary effort is normal.     Breath sounds: Normal breath sounds.  Skin:    General: Skin is warm and dry.  Neurological:     Mental Status: She is alert and oriented to person, place, and time.  Psychiatric:        Behavior: Behavior normal.    BP 133/60   Pulse 100   Ht 5\' 6"  (1.676 m)   Wt (!) 322 lb (146.1 kg)   LMP 12/08/2020 (Approximate)   SpO2 99% Comment: on RA  BMI 51.97 kg/m  Wt Readings from Last 3 Encounters:  01/19/21 (!) 322 lb (146.1 kg)   12/23/20 (!) 319 lb (144.7 kg)  12/01/20 (!) 323  lb (146.5 kg)     Health Maintenance Due  Topic Date Due   HIV Screening  Never done   Hepatitis C Screening  Never done   COVID-19 Vaccine (3 - Booster for Moderna series) 02/01/2020    There are no preventive care reminders to display for this patient.  Lab Results  Component Value Date   TSH 1.32 10/24/2020   Lab Results  Component Value Date   WBC 14.1 (H) 05/30/2017   HGB 12.4 05/30/2017   HCT 39.5 05/30/2017   MCV 74.4 (L) 05/30/2017   PLT 322 05/30/2017   Lab Results  Component Value Date   NA 137 05/30/2017   K 4.0 05/30/2017   CO2 22 05/30/2017   GLUCOSE 87 05/30/2017   BUN 9 05/30/2017   CREATININE 0.82 05/30/2017   BILITOT 0.7 05/30/2017   ALKPHOS 68 05/30/2017   AST 20 05/30/2017   ALT 16 05/30/2017   PROT 7.9 05/30/2017   ALBUMIN 4.3 05/30/2017   CALCIUM 9.5 05/30/2017   ANIONGAP 12 05/30/2017   Lab Results  Component Value Date   CHOL 148 09/05/2017   Lab Results  Component Value Date   HDL 32 (A) 09/05/2017   Lab Results  Component Value Date   LDLCALC 95 09/05/2017   Lab Results  Component Value Date   TRIG 105 09/05/2017   No results found for: [facility] Lab Results  Component Value Date   HGBA1C 5.6 11/13/2019      Assessment & Plan:   Problem List Items Addressed This Visit       Endocrine   IFG (impaired fasting glucose)    A1c in June looks good at 5.6.  Plan to repeat again in December.      Relevant Orders   Vitamin B1   Vitamin B6   B12   Magnesium   Folate   TSH     Other   Fibromyalgia    Discussed options especially since she had a bad reaction to Cymbalta and gabapentin.  She wants to stay away from medications that could affect her mood like Savella.  We could consider a trial of Lyrica.  I would really like for her to get on the Saxenda first before we start the Lyrica.  It is related to gabapentin and she did have some weight gain with the gabapentin.   Also, okay to stop the Flexeril and use more as needed since it did have some efficacy initially but seems to have worn off.      Relevant Medications   oxcarbazepine (TRILEPTAL) 600 MG tablet   B12 deficiency    He does have a history of B12 deficiency and it has not been checked recently so I would like to recheck that.      Other Visit Diagnoses     Need for influenza vaccination    -  Primary   Relevant Orders   Flu Vaccine QUAD 54mo+IM (Fluarix, Fluzone & Alfiuria Quad PF) (Completed)   Hair loss       Relevant Orders   Vitamin B1   Vitamin B6   B12   Magnesium   Folate   TSH   Vitamin D deficiency       Relevant Orders   Vitamin B1   Vitamin B6   B12   Magnesium   Folate   TSH   Metrorrhagia           Hair loss-unclear etiology.  No specific chemicals or traction that  should be causing it certainly could be stress or medication induced that is definitely possible.  Sometimes it can take up to 3 months for new growth to recur.  In the meantime we will check for some deficiencies just to rule out other potential causes.  Metrorrhagia-unfortunately she has been bleeding for about 6 weeks we discussed a couple of different options I think for now could have her stop her pill for a week and then restart it and see if that resets things we could always consider a round of progesterone which will stop the bleeding but then can oftentimes trigger a post withdrawal bleed.  If it does not help then we will move forward with ultrasound of the pelvis and consider consultation with GYN.  No orders of the defined types were placed in this encounter.   Follow-up: Return in about 2 months (around 03/21/2021) for hair loss and fibro.    Nani Gasseratherine Sohil Timko, MD

## 2021-01-19 NOTE — Assessment & Plan Note (Signed)
A1c in June looks good at 5.6.  Plan to repeat again in December.

## 2021-01-19 NOTE — Assessment & Plan Note (Addendum)
Discussed options especially since she had a bad reaction to Cymbalta and gabapentin.  She wants to stay away from medications that could affect her mood like Savella.  We could consider a trial of Lyrica.  I would really like for her to get on the Saxenda first before we start the Lyrica.  It is related to gabapentin and she did have some weight gain with the gabapentin.  Also, okay to stop the Flexeril and use more as needed since it did have some efficacy initially but seems to have worn off.

## 2021-01-20 NOTE — Progress Notes (Signed)
Hi Cesia, B12 looks good.  Your magnesium and folate are also normal.  Thyroid still looks good.  B6 and B1 are still pending.

## 2021-01-23 LAB — TSH: TSH: 0.63 mIU/L

## 2021-01-23 LAB — VITAMIN B12: Vitamin B-12: 395 pg/mL (ref 200–1100)

## 2021-01-23 LAB — MAGNESIUM: Magnesium: 2.1 mg/dL (ref 1.5–2.5)

## 2021-01-23 LAB — FOLATE: Folate: >24 ng/mL

## 2021-01-23 LAB — VITAMIN B1: Vitamin B1 (Thiamine): <6 nmol/L — ABNORMAL LOW (ref 8–30)

## 2021-01-23 LAB — VITAMIN B6: Vitamin B6: 18.9 ng/mL (ref 2.1–21.7)

## 2021-01-23 NOTE — Progress Notes (Signed)
Hi Tonya Clark,  Your B1 is low.  This is also called thiamine.  Low vitamin B1 can cause nausea, stomach pain, worsening depression, and fatigue, and muscle tenderness.  I do think increasing your level could help with your symptoms.  I do not think it will completely resolve everything but I certainly think it could improve some of what you are experiencing.  I would recommend that you start an over-the-counter B1/thiamine supplement daily (100mg ) and then we can recheck your blood work in about 8 weeks to make sure that your levels are coming up nicely.  Your vitamin B6 looks great.  Your B12 looks better than it did 3 months ago which is great.

## 2021-02-02 DIAGNOSIS — F4322 Adjustment disorder with anxiety: Secondary | ICD-10-CM | POA: Diagnosis not present

## 2021-02-09 ENCOUNTER — Other Ambulatory Visit: Payer: Self-pay | Admitting: Family Medicine

## 2021-02-09 DIAGNOSIS — M791 Myalgia, unspecified site: Secondary | ICD-10-CM

## 2021-02-11 ENCOUNTER — Encounter: Payer: BC Managed Care – PPO | Admitting: Neurology

## 2021-02-18 ENCOUNTER — Ambulatory Visit: Payer: BC Managed Care – PPO | Admitting: Cardiology

## 2021-02-18 DIAGNOSIS — F4322 Adjustment disorder with anxiety: Secondary | ICD-10-CM | POA: Diagnosis not present

## 2021-02-21 ENCOUNTER — Other Ambulatory Visit: Payer: Self-pay | Admitting: Family Medicine

## 2021-02-21 DIAGNOSIS — I10 Essential (primary) hypertension: Secondary | ICD-10-CM

## 2021-02-26 ENCOUNTER — Ambulatory Visit (INDEPENDENT_AMBULATORY_CARE_PROVIDER_SITE_OTHER): Payer: BC Managed Care – PPO | Admitting: Neurology

## 2021-02-26 ENCOUNTER — Other Ambulatory Visit: Payer: Self-pay

## 2021-02-26 DIAGNOSIS — G4733 Obstructive sleep apnea (adult) (pediatric): Secondary | ICD-10-CM | POA: Diagnosis not present

## 2021-02-26 DIAGNOSIS — G4719 Other hypersomnia: Secondary | ICD-10-CM

## 2021-02-26 DIAGNOSIS — R0683 Snoring: Secondary | ICD-10-CM

## 2021-02-27 DIAGNOSIS — G4733 Obstructive sleep apnea (adult) (pediatric): Secondary | ICD-10-CM | POA: Insufficient documentation

## 2021-02-27 NOTE — Progress Notes (Signed)
PATIENT'S NAME:  Tonya Clark, Tonya Clark DOB:      January 05, 1996      MR#:    [id number]     DATE OF RECORDING: 02/26/2021 REFERRING M.D.:  Nani Gasser, MD Study Performed:   Baseline Polysomnogram HISTORY:   Season Astacio is a 25 year old with excessive sleepiness since January.  She snores.   Her husband has noted pauses in breathing.   Many years ago she had a home sleep study which was normal.  Weight was 60 pounds less at that time.  She also reports insomnia.  She takes [place] with benefit.  The patient endorsed the Epworth Sleepiness Scale at 14 points.    The patient's weight 319 pounds with a height of 66 (inches), resulting in a BMI of 51.4 kg/m2.  The patient's neck circumference measured  inches.  CURRENT MEDICATIONS: Neurontin, Ajovy, Flexeril, Cardizem, Lunesta, Junel FE, Singulair, Prilosec, Trileptal, Vraylar, Vyvanse   PROCEDURE:  This is a multichannel digital polysomnogram utilizing the Somnostar 11.2 system.  Electrodes and sensors were applied and monitored per AASM Specifications.   EEG, EOG, Chin and Limb EMG, were sampled at 200 Hz.  ECG, Snore and Nasal Pressure, Thermal Airflow, Respiratory Effort, CPAP Flow and Pressure, Oximetry was sampled at 50 Hz. Digital video and audio were recorded.      BASELINE STUDY  Lights Out was at 21:41 and Lights On at 05:00.  Total recording time (TRT) was 439.5 minutes, with a total sleep time (TST) of 405 minutes.   The patient's sleep latency was 10.5 minutes.  REM latency was 123 minutes.  The sleep efficiency was 92.2 %.     SLEEP ARCHITECTURE: WASO (Wake after sleep onset) was 31.5 minutes.  There were 17 minutes in Stage N1, 313.5 minutes Stage N2, 42 minutes Stage N3 and 32.5 minutes in Stage REM.  The percentage of Stage N1 was 4.2%, Stage N2 was 77.4%, Stage N3 was 10.4% and Stage R (REM sleep) was 8.%.  The arousals were noted as: 39 were spontaneous, 0 were associated with PLMs, 147 were associated with respiratory  events.  RESPIRATORY ANALYSIS:  There were a total of 450 respiratory events:  240 obstructive apneas, 0 central apneas and 2 mixed apneas with a total of 242 apneas and an apnea index (AI) of 35.9 /hour. There were 208 hypopneas with a hypopnea index of 30.8 /hour. The patient also had 0 respiratory event related arousals (RERAs).      The total APNEA/HYPOPNEA INDEX (AHI) was 66.7/hour and the total RESPIRATORY DISTURBANCE INDEX was  66.7 /hour.  15 events occurred in REM sleep and 392 events in NREM. The REM AHI was  27.7 /hour, versus a non-REM AHI of 70.1. The patient spent 283.5 minutes of total sleep time in the supine position and 122 minutes in non-supine.. The supine AHI was 66.0 versus a non-supine AHI of 68.2.  OXYGEN SATURATION & C02:  The Wake baseline 02 saturation was 96%, with the lowest being 82%. Time spent below 89% saturation equaled 53 minutes.   PERIODIC LIMB MOVEMENTS:   The patient had a total of 0 Periodic Limb Movements.  The Periodic Limb Movement (PLM) index was 0 and the PLM Arousal index was 0/hour.  OTHER Audio and video analysis did not show any abnormal or unusual movements, behaviors, phonations or vocalizations.   The patient took one bathroom break. EKG was in keeping with normal sinus rhythm (NSR).  Post-study, the patient indicated that sleep was worse than usual.  IMPRESSION:  Severe OSA with an AHI = 66.7  Normal Sleep Efficiency.  All sleep stages were recorded   RECOMMENDATIONS:  Auto-CPAP with pressures 5-20 cm H2O and heated humidifier Download in 30/90 days Follow up with Dr. Epimenio Foot.     I certify that I have reviewed the entire raw data recording prior to the issuance of this report in accordance with the Standards of Accreditation of the American Academy of Sleep Medicine (AASM)   Shearon Balo, PhD Diplomat, American Board of Psychiatry and Neurology, Neurology, Clinical Neurophysiology and Sleep Medicine

## 2021-03-02 ENCOUNTER — Encounter: Payer: Self-pay | Admitting: Family Medicine

## 2021-03-02 DIAGNOSIS — F4322 Adjustment disorder with anxiety: Secondary | ICD-10-CM | POA: Diagnosis not present

## 2021-03-03 MED ORDER — MELOXICAM 15 MG PO TABS: 15.0000 mg | ORAL_TABLET | Freq: Every day | ORAL | 1 refills | Status: DC

## 2021-03-09 ENCOUNTER — Other Ambulatory Visit: Payer: Self-pay | Admitting: *Deleted

## 2021-03-09 DIAGNOSIS — G4733 Obstructive sleep apnea (adult) (pediatric): Secondary | ICD-10-CM

## 2021-03-12 ENCOUNTER — Ambulatory Visit: Payer: BC Managed Care – PPO | Admitting: Cardiology

## 2021-03-16 ENCOUNTER — Ambulatory Visit: Payer: BC Managed Care – PPO | Admitting: Family Medicine

## 2021-04-03 ENCOUNTER — Telehealth: Payer: Self-pay

## 2021-04-03 NOTE — Telephone Encounter (Signed)
Medication: AJOVY 225 MG/1.5ML SOAJ Prior authorization submitted via CoverMyMeds on 04/03/2021 PA submission pending

## 2021-04-06 ENCOUNTER — Encounter: Payer: Self-pay | Admitting: Family Medicine

## 2021-04-06 ENCOUNTER — Ambulatory Visit (INDEPENDENT_AMBULATORY_CARE_PROVIDER_SITE_OTHER): Payer: BC Managed Care – PPO | Admitting: Family Medicine

## 2021-04-06 VITALS — BP 135/65 | HR 112 | Ht 66.0 in | Wt 308.0 lb

## 2021-04-06 DIAGNOSIS — R000 Tachycardia, unspecified: Secondary | ICD-10-CM | POA: Diagnosis not present

## 2021-04-06 DIAGNOSIS — Z3169 Encounter for other general counseling and advice on procreation: Secondary | ICD-10-CM

## 2021-04-06 NOTE — Progress Notes (Signed)
Pt has questions about conceiving:  - how will HBP affect pregnancy  - what medications are you prescribing should I come off of  - would like to find a plus size friendly OB for pregnancy and birth  - do I need more folic acid being that I am plus sized  -  prediabetes/gestational diabetes how will this affect pregnancy  -  can being pregnant affect my fibromyalgia  -  any plus size pregnancy concerns I should be aware of   -  artificial insemination vs intercourse is one more effective than the other  -  how do I know when I'm ovulating  -  what are the next steps.    I have already spoken to my psychiatrist about  meds

## 2021-04-06 NOTE — Progress Notes (Signed)
Established Patient Office Visit  Subjective:  Patient ID: Tonya Clark, female    DOB: [birth year]  Age: 25 y.o. MRN: [id number]  CC:  Chief Complaint  Patient presents with   Follow-up    HPI Tonya Clark presents to discuss preparing to get pregnant.  She and her husband have started trying to get pregnant about 2 weeks ago.  She has been working with her psychiatrist to make some changes on her medication regimen and wanted to come in today to see if there are any changes that we need to make for the medications that we currently prescribe.  And also to have some questions answered.  She is currently off of her Ajovy and she ran out probably a month ago and so just stopped taking it she is actually been doing really well.  They are actually tapering her prazosin which she is using for nightmares says she is down to 2 mg and should hopefully continue to wean that.  He is really not using her Mobic now that her back is feeling better she is still using muscle relaxer little bit here there.  She is already off the Victoza.  She is currently trying to eat healthy and stay well-hydrated.  Also her psychiatrist will taper the Lunesta if she gets pregnant.  Pt has questions about conceiving:   - how will HBP affect pregnancy   - what medications are you prescribing should I come off of   - would like to find a plus size friendly OB for pregnancy and birth   - do I need more folic acid being that I am plus sized   -  prediabetes/gestational diabetes how will this affect pregnancy   -  can being pregnant affect my fibromyalgia   -  any plus size pregnancy concerns I should be aware of    -  artificial insemination vs intercourse is one more effective than the other   -  how do I know when I'm ovulating   -  what are the next steps.     I have already spoken to my psychiatrist about  meds       She also had an episode recently where she got up in the morning and felt her heart  racing and then felt her ear start to ring to the point that she could not hear anything else.  She sat down and it eventually subsided.  She is never really had that happen before.  Past Medical History:  Diagnosis Date   Arrhythmia    Bipolar 1 disorder (HCC) 08/21/2013   Jaundice, neonatal    Kidney stone    Premature birth    17 weeks    Past Surgical History:  Procedure Laterality Date   TONSILLECTOMY  2009    Family History  Problem Relation Age of Onset   Hypertension Mother    Allergies Mother    Anxiety disorder Father    Hypertension Father     Social History   Socioeconomic History   Marital status: Married    Spouse name: [name]   Number of children: Not on file   Years of education: Not on file   Highest education level: Bachelor's degree (e.g., BA, AB, BS)  Occupational History   Occupation: Student  Tobacco Use   Smoking status: Never   Smokeless tobacco: Never  Vaping Use   Vaping Use: Never used  Substance and Sexual Activity   Alcohol use: No   Drug  use: No   Sexual activity: Not on file  Other Topics Concern   Not on file  Social History Narrative   Was home schooled for HS. She is now working at a [employer] 2-3 times per week. Born in [place].      Lives with husband   Right handed   Caffeine: 45mg  of coke zero a day          Social Determinants of Health   Financial Resource Strain: Not on file  Food Insecurity: Not on file  Transportation Needs: Not on file  Physical Activity: Not on file  Stress: Not on file  Social Connections: Not on file  Intimate Partner Violence: Not on file    Outpatient Medications Prior to Visit  Medication Sig Dispense Refill   cyclobenzaprine (FLEXERIL) 10 MG tablet TAKE 1 TABLET BY MOUTH EVERY DAY AT BEDTIME AS NEEDED 30 tablet 0   Eszopiclone 3 MG TABS Take 3 mg by mouth at bedtime.     meloxicam (MOBIC) 15 MG tablet Take 1 tablet (15 mg total) by mouth daily. 30 tablet 1    montelukast (SINGULAIR) 10 MG tablet Take 1 tablet by mouth daily.     omeprazole (PRILOSEC) 40 MG capsule Take by mouth every morning.     oxcarbazepine (TRILEPTAL) 600 MG tablet Take 600 mg by mouth 2 (two) times daily.     VRAYLAR 4.5 MG CAPS Take 1 capsule by mouth daily.     VYVANSE 30 MG capsule Take 30 mg by mouth every morning.     prazosin (MINIPRESS) 1 MG capsule Take 2 capsules by mouth at bedtime.     diltiazem (CARDIZEM CD) 240 MG 24 hr capsule Take 1 capsule (240 mg total) by mouth daily. 90 capsule 3   AJOVY 225 MG/1.5ML SOAJ INJECT 1 PEN INTO THE SKIN EVERY 30 DAYS 1.5 mL 8   JUNEL FE 1.5/30 1.5-30 MG-MCG tablet Take 1 tablet by mouth daily.     liraglutide (VICTOZA) 18 MG/3ML SOPN Inject into the skin. (Patient not taking: Reported on 01/19/2021)     No facility-administered medications prior to visit.    Allergies  Allergen Reactions   Lamictal [Lamotrigine] Rash   Trazodone And Nefazodone Other (See Comments)    Pt states, "sleep paralysis"   Cymbalta [Duloxetine Hcl] Other (See Comments)    Suicidal ideation   Definity [Perflutren Lipid Microsphere] Nausea And Vomiting and Other (See Comments)    Nauseated, felt hot all over, with tingling in throat, tongue, hands and feet   Gabapentin Other (See Comments)    Hair Loss and weight gain    ROS Review of Systems    Objective:    Physical Exam Constitutional:      Appearance: Normal appearance. She is well-developed.  HENT:     Head: Normocephalic and atraumatic.  Cardiovascular:     Rate and Rhythm: Normal rate and regular rhythm.     Heart sounds: Normal heart sounds.  Pulmonary:     Effort: Pulmonary effort is normal.     Breath sounds: Normal breath sounds.  Skin:    General: Skin is warm and dry.  Neurological:     Mental Status: She is alert and oriented to person, place, and time.  Psychiatric:        Behavior: Behavior normal.    BP 135/65   Pulse (!) 112   Ht 5\' 6"  (1.676 m)   Wt (!) 308  lb (139.7 kg)  LMP 04/05/2021   SpO2 100%   BMI 49.71 kg/m  Wt Readings from Last 3 Encounters:  04/06/21 (!) 308 lb (139.7 kg)  01/19/21 (!) 322 lb (146.1 kg)  12/23/20 (!) 319 lb (144.7 kg)     Health Maintenance Due  Topic Date Due   HIV Screening  Never done   Hepatitis C Screening  Never done   COVID-19 Vaccine (3 - Booster for Moderna series) 10/27/2019    There are no preventive care reminders to display for this patient.  Lab Results  Component Value Date   TSH 0.63 01/19/2021   Lab Results  Component Value Date   WBC 14.1 (H) 05/30/2017   HGB 12.4 05/30/2017   HCT 39.5 05/30/2017   MCV 74.4 (L) 05/30/2017   PLT 322 05/30/2017   Lab Results  Component Value Date   NA 137 05/30/2017   K 4.0 05/30/2017   CO2 22 05/30/2017   GLUCOSE 87 05/30/2017   BUN 9 05/30/2017   CREATININE 0.82 05/30/2017   BILITOT 0.7 05/30/2017   ALKPHOS 68 05/30/2017   AST 20 05/30/2017   ALT 16 05/30/2017   PROT 7.9 05/30/2017   ALBUMIN 4.3 05/30/2017   CALCIUM 9.5 05/30/2017   ANIONGAP 12 05/30/2017   Lab Results  Component Value Date   CHOL 148 09/05/2017   Lab Results  Component Value Date   HDL 32 (A) 09/05/2017   Lab Results  Component Value Date   LDLCALC 95 09/05/2017   Lab Results  Component Value Date   TRIG 105 09/05/2017   No results found for: CHOLHDL Lab Results  Component Value Date   HGBA1C 5.6 11/13/2019      Assessment & Plan:   Problem List Items Addressed This Visit       Other   Tachycardia   Other Visit Diagnoses     Encounter for preconception consultation    -  Primary       Preconception counseling answered all of her questions today.  We discussed her medications.  She will need to discontinue the Vyvanse as soon as she finds out that she is pregnant.  She will also need to avoid NSAIDs such as meloxicam the Flexeril is okay for just as needed infrequent use.  Okay to use the Singulair as needed as well.  She is already  started a multivitamin for pregnancy which is perfect which has extra iron and folic acid.  She has been walking and exercising regularly which is great.  We discussed the goal of only getting 10 to 12 pounds during her pregnancy and establishing with an OB GYN.  Her Cardizem which she is currently taking for blood pressure should be okay.  We discussed foods to avoid and cutting back on caffeine.  She does not smoke or drink alcohol.  It sounds like she was getting ready to have a near syncopal event.  Discussed hydrating well and checking her blood pressure at home if this happens again it could be that we actually need to decrease her medication.  No orders of the defined types were placed in this encounter.   Follow-up: Return if symptoms worsen or fail to improve.    Nani Gasser, MD

## 2021-04-11 ENCOUNTER — Other Ambulatory Visit: Payer: Self-pay

## 2021-04-11 ENCOUNTER — Emergency Department (INDEPENDENT_AMBULATORY_CARE_PROVIDER_SITE_OTHER)
Admission: EM | Admit: 2021-04-11 | Discharge: 2021-04-11 | Disposition: A | Discharge status: Home / Self Care | Payer: BC Managed Care – PPO | Source: Home / Self Care

## 2021-04-11 DIAGNOSIS — R509 Fever, unspecified: Secondary | ICD-10-CM

## 2021-04-11 DIAGNOSIS — K122 Cellulitis and abscess of mouth: Secondary | ICD-10-CM | POA: Diagnosis not present

## 2021-04-11 DIAGNOSIS — J029 Acute pharyngitis, unspecified: Secondary | ICD-10-CM

## 2021-04-11 LAB — POCT RAPID STREP A (OFFICE): Rapid Strep A Screen: NEGATIVE

## 2021-04-11 LAB — POCT INFLUENZA A/B
Influenza A, POC: NEGATIVE
Influenza B, POC: NEGATIVE

## 2021-04-11 MED ORDER — PREDNISONE 20 MG PO TABS: ORAL_TABLET | ORAL | 0 refills | Status: DC

## 2021-04-11 MED ORDER — CEFDINIR 300 MG PO CAPS: 300.0000 mg | ORAL_CAPSULE | Freq: Two times a day (BID) | ORAL | 0 refills | Status: AC

## 2021-04-11 NOTE — Discharge Instructions (Addendum)
Advised patient to take medication as directed with food to completion.  Encouraged patient to check oral temperature frequently and to keep below 99.0.  Advised patient to take Prednisone burst with first dose of Cefdinir for 5 of 7 days.  Advised patient may alternate OTC Tylenol 1000 mg 1-2 times daily, as needed with OTC Ibuprofen 600 mg 1-2 times daily, as needed for fever and myalgias.  Encouraged patient to increase daily water intake while taking these medications.  Work note provided prior to discharge.

## 2021-04-11 NOTE — ED Notes (Signed)
Pt took Dayquil approx 1.5 hours ago, which has acetaminophen 650 mg.

## 2021-04-11 NOTE — ED Provider Notes (Signed)
[facility]    CSN: [id number] Arrival date & time: 04/11/21  0843      History   Chief Complaint Chief Complaint  Patient presents with   Sore Throat   Generalized Body Aches    HPI Tonya Clark is a 25 y.o. female.   HPI 25 year old female presents with sore throat for 1 day.  Reports fever, nasal congestion and body aches since early this morning.  Patient reports exposure to influenza.  PMH significant for tachycardia, PVC, and PAC.  Patient is accompanied by her husband today.  Past Medical History:  Diagnosis Date   Arrhythmia    Bipolar 1 disorder (HCC) 08/21/2013   Jaundice, neonatal    Kidney stone    Premature birth    64 weeks    Patient Active Problem List   Diagnosis Date Noted   OSA (obstructive sleep apnea) 02/27/2021   IFG (impaired fasting glucose) 01/19/2021   Numbness and tingling 12/23/2020   Snoring 12/23/2020   Excessive daytime sleepiness 12/23/2020   Fibromyalgia 12/01/2020   Sinus tachycardia 10/27/2020   History of COVID-19 10/27/2020   Morbid obesity (HCC) 10/27/2020   Essential hypertension 10/24/2020   B12 deficiency 10/24/2020   Arrhythmia    Jaundice, neonatal    Kidney stone    Premature birth    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      Home Medications    Prior to Admission medications   Medication Sig Start Date End Date Taking? Authorizing Provider  cefdinir  (OMNICEF) 300 MG capsule Take 1 capsule (300 mg total) by mouth 2 (two) times daily for 7 days. 04/11/21 04/18/21 Yes Trevor Iha, FNP  predniSONE (DELTASONE) 20 MG tablet Take 3 tabs PO daily x 5 days. 04/11/21  Yes Trevor Iha, FNP  Pseudoephedrine-APAP-DM (DAYQUIL MULTI-SYMPTOM PO) Take by mouth.   Yes [provider]  cyclobenzaprine (FLEXERIL) 10 MG tablet TAKE 1 TABLET BY MOUTH EVERY DAY AT BEDTIME AS NEEDED 02/11/21   Agapito Games, MD  diltiazem (CARDIZEM CD) 240 MG 24 hr capsule Take 1 capsule (240 mg total) by mouth daily. 11/26/20 02/24/21  Tobb, Kardie, DO  Eszopiclone 3 MG TABS Take 3 mg by mouth at bedtime. 12/29/20   [provider]  meloxicam (MOBIC) 15 MG tablet Take 1 tablet (15 mg total) by mouth daily. 03/03/21   Agapito Games, MD  montelukast (SINGULAIR) 10 MG tablet Take 1 tablet by mouth daily. 09/05/20   [provider]  omeprazole (PRILOSEC) 40 MG capsule Take by mouth every morning. 08/04/20   [provider]  oxcarbazepine (TRILEPTAL) 600 MG tablet Take 600 mg by mouth 2 (two) times daily. 01/14/21   [provider]  VRAYLAR 4.5 MG CAPS Take 1 capsule by mouth daily. 10/23/20   [provider]  VYVANSE 30 MG capsule Take 30 mg by mouth every morning. 09/24/20   [provider]    Family History Family History  Problem Relation Age of Onset   Hypertension Mother    Allergies Mother    Anxiety disorder Father    Hypertension Father     Social History Social History   Tobacco Use   Smoking status: Never   Smokeless tobacco: Never  Vaping Use   Vaping Use: Never used  Substance Use Topics   Alcohol use: No   Drug use: No     Allergies   Lamictal [lamotrigine], Trazodone and nefazodone, Cymbalta [duloxetine hcl], Definity [perflutren lipid microsphere], and Gabapentin   Review of Systems Review of Systems  Constitutional:  Positive for fever.  HENT:  Positive for congestion and  sore throat.   Musculoskeletal:  Positive for myalgias.  All other systems reviewed and are negative.   Physical Exam Triage Vital Signs ED Triage Vitals  Enc Vitals Group     BP 04/11/21 0913 125/82     Pulse Rate 04/11/21 0913 (!) 137     Resp 04/11/21 0913 20     Temp 04/11/21 0913 (!) 101.6 F (38.7 C)     Temp src --      SpO2 04/11/21 0913 96 %     Weight 04/11/21 0908 300 lb (136.1 kg)     Height 04/11/21 0908 5\' 6"  (1.676 m)     Head Circumference --      Peak Flow --      Pain Score 04/11/21 0908 7     Pain Loc --      Pain Edu? --      Excl. in GC? --    No data found.  Updated Vital Signs BP 125/82   Pulse (!) 137   Temp (!) 101.6 F (38.7 C)   Resp 20   Ht 5\' 6"  (1.676 m)   Wt 300 lb (136.1 kg)   LMP 03/26/2021 (Exact Date) Comment: "unlikely" when asked if there is a chance she is pregnant  SpO2 96%   BMI 48.42 kg/m      Physical Exam Vitals and nursing note reviewed.  Constitutional:      General: She is not in acute distress.    Appearance: She is well-developed. She is obese.  HENT:     Head: Normocephalic and atraumatic.     Right Ear: Tympanic membrane and ear canal normal.     Left Ear: Tympanic membrane and ear canal normal.     Mouth/Throat:     Mouth: Mucous membranes are moist.     Pharynx: Uvula midline. Pharyngeal swelling, posterior oropharyngeal erythema and uvula swelling present.     Tonsils: Tonsillar exudate present. 2+ on the right. 2+ on the left.  Eyes:     Conjunctiva/sclera: Conjunctivae normal.     Pupils: Pupils are equal, round, and reactive to light.  Cardiovascular:     Rate and Rhythm: Regular rhythm. Tachycardia present.     Heart sounds: Normal heart sounds.  Pulmonary:     Effort: Pulmonary effort is normal.     Breath sounds: Normal breath sounds.  Skin:    General: Skin is warm and dry.  Neurological:     General: No focal deficit present.     Mental Status: She is alert and oriented to person, place,  and time.     UC Treatments / Results  Labs (all labs ordered are listed, but only abnormal results are displayed) Labs Reviewed  POCT RAPID STREP A (OFFICE)  POCT INFLUENZA A/B    EKG   Radiology No results  found.  Procedures Procedures (including critical care time)  Medications Ordered in UC Medications - No data to display  Initial Impression / Assessment and Plan / UC Course  I have reviewed the triage vital signs and the nursing notes.  Pertinent labs & imaging results that were available during my care of the patient were reviewed by me and considered in my medical decision making (see chart for details).     MDM: 1.  Uvulitis-Rx'd Cefdinir and Prednisone burst; 2.  Fever-Advised patient may alternate OTC Tylenol 1000 mg 1-2 times daily, as needed with OTC Ibuprofen 600 mg 1-2 times daily, as needed for fever and myalgias. Advised patient to take medication as directed with food to completion. Encouraged patient to increase daily water intake while taking these medications.  Work note provided prior to discharge.  Discharged home, hemodynamically stable. Final Clinical Impressions(s) / UC Diagnoses   Final diagnoses:  Sore throat  Fever, unspecified  Uvulitis     Discharge Instructions      Advised patient to take medication as directed with food to completion.  Encouraged patient to check oral temperature frequently and to keep below 99.0.  Advised patient to take Prednisone burst with first dose of Cefdinir for 5 of 7 days.  Advised patient may alternate OTC Tylenol 1000 mg 1-2 times daily, as needed with OTC Ibuprofen 600 mg 1-2 times daily, as needed for fever and myalgias.  Encouraged patient to increase daily water intake while taking these medications.  Work note provided prior to discharge.     ED Prescriptions     Medication Sig Dispense Auth. Provider   cefdinir (OMNICEF) 300 MG capsule Take 1 capsule (300 mg total) by mouth 2 (two) times daily for  7 days. 14 capsule Trevor Iha, FNP   predniSONE (DELTASONE) 20 MG tablet Take 3 tabs PO daily x 5 days. 15 tablet Trevor Iha, FNP      PDMP not reviewed this encounter.   Trevor Iha, FNP 04/11/21 1011

## 2021-04-11 NOTE — ED Triage Notes (Signed)
Pt presents to Urgent Care with c/o sore throat since yesterday and fever, nasal congestion, and body aches since early this AM. Has been exposed to flu; vaccinated.

## 2021-04-15 DIAGNOSIS — G4733 Obstructive sleep apnea (adult) (pediatric): Secondary | ICD-10-CM | POA: Diagnosis not present

## 2021-04-18 ENCOUNTER — Telehealth: Payer: Self-pay | Admitting: Emergency Medicine

## 2021-04-18 NOTE — Telephone Encounter (Signed)
Return call to [facility] regarding missed antibiotic doses. Per pt she took the medicine for 3 days, went out of town, forgot to bring her antibiotic & has returned home. States she still does not feel well and wants to know if she should complete the rest of the antibiotics. Chart & pt questions reviewed w/ Dr Delton See who directed pt to finish the course of antibiotics. Pt to follow up with her PCP next week if she is not any better. Pt verbalized an understanding. No other questions at this time.

## 2021-04-20 ENCOUNTER — Emergency Department (INDEPENDENT_AMBULATORY_CARE_PROVIDER_SITE_OTHER): Payer: BC Managed Care – PPO

## 2021-04-20 ENCOUNTER — Emergency Department (INDEPENDENT_AMBULATORY_CARE_PROVIDER_SITE_OTHER)
Admission: RE | Admit: 2021-04-20 | Discharge: 2021-04-20 | Disposition: A | Discharge status: Home / Self Care | Payer: BC Managed Care – PPO | Source: Ambulatory Visit

## 2021-04-20 ENCOUNTER — Other Ambulatory Visit: Payer: Self-pay

## 2021-04-20 VITALS — BP 136/98 | HR 118 | Temp 99.5°F | Resp 16

## 2021-04-20 DIAGNOSIS — R059 Cough, unspecified: Secondary | ICD-10-CM | POA: Diagnosis not present

## 2021-04-20 DIAGNOSIS — R0602 Shortness of breath: Secondary | ICD-10-CM | POA: Diagnosis not present

## 2021-04-20 NOTE — Discharge Instructions (Addendum)
Advised patient if cough worsens and/or experiencing pain with breathing please follow-up with PCP or go to nearest ED for further evaluation.

## 2021-04-20 NOTE — ED Provider Notes (Signed)
[facility]    CSN: [id number] Arrival date & time: 04/20/21  1848      History   Chief Complaint Chief Complaint  Patient presents with   Cough   Headache    HPI Kellsie Grindle is a 25 y.o. female.   HPI 25 year old female presents with continued cough, burning sensation in her chest, sore throat, headache since 04/11/2020.  Patient is negative for COVID and flu. Patient was evaluated by me on 1119/22 and prescribed Cefdinir and Prednisone for sore throat/uvulitis.  PMH significant for morbid obesity, fibromyalgia, and inflammatory polyarthritis.  Past Medical History:  Diagnosis Date   Arrhythmia    Bipolar 1 disorder (HCC) 08/21/2013   Jaundice, neonatal    Kidney stone    Premature birth    29 weeks    Patient Active Problem List   Diagnosis Date Noted   OSA (obstructive sleep apnea) 02/27/2021   IFG (impaired fasting glucose) 01/19/2021   Numbness and tingling 12/23/2020   Snoring 12/23/2020   Excessive daytime sleepiness 12/23/2020   Fibromyalgia 12/01/2020   Sinus tachycardia 10/27/2020   History of COVID-19 10/27/2020   Morbid obesity (HCC) 10/27/2020   Essential hypertension 10/24/2020   B12 deficiency 10/24/2020   Arrhythmia    Jaundice, neonatal    Kidney stone    Premature birth    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      Home Medications    Prior to Admission medications   Medication  Sig Start Date End Date Taking? Authorizing Provider  cyclobenzaprine (FLEXERIL) 10 MG tablet TAKE 1 TABLET BY MOUTH EVERY DAY AT BEDTIME AS NEEDED 02/11/21   Agapito Games, MD  diltiazem (CARDIZEM CD) 240 MG 24 hr capsule Take 1 capsule (240 mg total) by mouth daily. 11/26/20 02/24/21  Tobb, Kardie, DO  Eszopiclone 3 MG TABS Take 3 mg by mouth at bedtime. 12/29/20   [provider]  meloxicam (MOBIC) 15 MG tablet Take 1 tablet (15 mg total) by mouth daily. 03/03/21   Agapito Games, MD  montelukast (SINGULAIR) 10 MG tablet Take 1 tablet by mouth daily. 09/05/20   [provider]  omeprazole (PRILOSEC) 40 MG capsule Take by mouth every morning. 08/04/20   [provider]  oxcarbazepine (TRILEPTAL) 600 MG tablet Take 600 mg by mouth 2 (two) times daily. 01/14/21   [provider]  predniSONE (DELTASONE) 20 MG tablet Take 3 tabs PO daily x 5 days. 04/11/21   Trevor Iha, FNP  Pseudoephedrine-APAP-DM (DAYQUIL MULTI-SYMPTOM PO) Take by mouth.    [provider]  VRAYLAR 4.5 MG CAPS Take 1 capsule by mouth daily. 10/23/20   [provider]  VYVANSE 30 MG capsule Take 30 mg by mouth every morning. 09/24/20   [provider]    Family History Family History  Problem Relation Age of Onset   Hypertension Mother    Allergies Mother    Anxiety disorder Father    Hypertension  Father     Social History Social History   Tobacco Use   Smoking status: Never   Smokeless tobacco: Never  Vaping Use   Vaping Use: Never used  Substance Use Topics   Alcohol use: No   Drug use: No     Allergies   Lamictal [lamotrigine], Trazodone and nefazodone, Cymbalta [duloxetine hcl], Definity [perflutren lipid microsphere], and Gabapentin   Review of Systems Review of Systems  HENT:  Positive for sore throat.   Respiratory:  Positive for cough.   All other systems reviewed and are negative.   Physical Exam Triage Vital Signs ED Triage  Vitals  Enc Vitals Group     BP 04/20/21 2003 (!) 136/98     Pulse Rate 04/20/21 2003 (!) 118     Resp 04/20/21 2003 16     Temp 04/20/21 2003 99.5 F (37.5 C)     Temp Source 04/20/21 2003 Oral     SpO2 04/20/21 2003 97 %     Weight --      Height --      Head Circumference --      Peak Flow --      Pain Score 04/20/21 2008 4     Pain Loc --      Pain Edu? --      Excl. in GC? --    No data found.  Updated Vital Signs BP (!) 136/98 (BP Location: Left Arm)   Pulse (!) 118   Temp 99.5 F (37.5 C) (Oral)   Resp 16   LMP 04/19/2021   SpO2 97%    Physical Exam Vitals and nursing note reviewed.  Constitutional:      General: She is not in acute distress.    Appearance: Normal appearance. She is obese. She is not ill-appearing.  HENT:     Head: Normocephalic and atraumatic.     Right Ear: Tympanic membrane, ear canal and external ear normal.     Left Ear: Tympanic membrane, ear canal and external ear normal.     Mouth/Throat:     Mouth: Mucous membranes are moist.     Pharynx: Oropharynx is clear.  Eyes:     Extraocular Movements: Extraocular movements intact.     Conjunctiva/sclera: Conjunctivae normal.     Pupils: Pupils are equal, round, and reactive to light.  Cardiovascular:     Rate and Rhythm: Normal rate and regular rhythm.     Pulses: Normal pulses.     Heart sounds: Normal heart sounds.  Pulmonary:     Effort: Pulmonary effort is normal.     Breath sounds: Normal breath sounds. No wheezing, rhonchi or rales.  Musculoskeletal:        General: Normal range of motion.     Cervical back: Normal range of motion and neck supple.  Skin:    General: Skin is warm and dry.  Neurological:     General: No focal deficit present.     Mental Status: She is alert.     UC Treatments / Results  Labs (all labs ordered are listed, but only abnormal results are displayed) Labs Reviewed - No data to display  EKG   Radiology DG Chest 2 View  Result Date:  04/20/2021 CLINICAL DATA:  Cough and shortness of breath. EXAM: CHEST - 2 VIEW COMPARISON:  Chest x-ray dated 10/02/2020 FINDINGS: Heart size and mediastinal contours are within normal limits. Lungs are clear. No pleural effusion or pneumothorax is seen. Osseous structures about the  chest are unremarkable. IMPRESSION: Normal chest x-ray.  No evidence of pneumonia. Electronically Signed   By: Bary Richard M.D.   On: 04/20/2021 21:08    Procedures Procedures (including critical care time)  Medications Ordered in UC Medications - No data to display  Initial Impression / Assessment and Plan / UC Course  I have reviewed the triage vital signs and the nursing notes.  Pertinent labs & imaging results that were available during my care of the patient were reviewed by me and considered in my medical decision making (see chart for details).     MDM: 1. Cough-CXR revealed above. Advised patient if cough worsens and/or experiencing pain with breathing please follow-up with PCP or go to nearest ED for further evaluation.  Patient discharged home, hemodynamically stable. Final Clinical Impressions(s) / UC Diagnoses   Final diagnoses:  Cough, unspecified type     Discharge Instructions      Advised patient if cough worsens and/or experiencing pain with breathing please follow-up with PCP or go to nearest ED for further evaluation.     ED Prescriptions   None    PDMP not reviewed this encounter.   Trevor Iha, FNP 04/20/21 2116

## 2021-04-20 NOTE — ED Triage Notes (Signed)
Pt presents with continued cough, a burning sensation in her chest, sore throat, and headache since 11/19. Pt was negative for covid and flu but dx with uvulitis. Pt completed prednisone

## 2021-04-23 ENCOUNTER — Ambulatory Visit: Payer: BC Managed Care – PPO | Admitting: Family Medicine

## 2021-04-23 ENCOUNTER — Ambulatory Visit: Payer: BC Managed Care – PPO | Admitting: Cardiology

## 2021-05-06 ENCOUNTER — Encounter: Payer: Self-pay | Admitting: Neurology

## 2021-05-11 ENCOUNTER — Encounter: Payer: Self-pay | Admitting: Family Medicine

## 2021-05-11 ENCOUNTER — Other Ambulatory Visit: Payer: Self-pay | Admitting: Family Medicine

## 2021-05-11 DIAGNOSIS — M791 Myalgia, unspecified site: Secondary | ICD-10-CM

## 2021-05-12 MED ORDER — CYCLOBENZAPRINE HCL 10 MG PO TABS: 10.0000 mg | ORAL_TABLET | Freq: Every day | ORAL | 0 refills | Status: DC

## 2021-05-15 DIAGNOSIS — G4733 Obstructive sleep apnea (adult) (pediatric): Secondary | ICD-10-CM | POA: Diagnosis not present

## 2021-05-18 DIAGNOSIS — G4733 Obstructive sleep apnea (adult) (pediatric): Secondary | ICD-10-CM | POA: Diagnosis not present

## 2021-06-07 ENCOUNTER — Other Ambulatory Visit: Payer: Self-pay | Admitting: Family Medicine

## 2021-06-07 DIAGNOSIS — M791 Myalgia, unspecified site: Secondary | ICD-10-CM

## 2021-06-08 ENCOUNTER — Encounter: Payer: Self-pay | Admitting: Neurology

## 2021-06-08 ENCOUNTER — Ambulatory Visit (INDEPENDENT_AMBULATORY_CARE_PROVIDER_SITE_OTHER): Payer: BC Managed Care – PPO | Admitting: Neurology

## 2021-06-08 VITALS — BP 170/96 | HR 108 | Ht 66.0 in | Wt 314.4 lb

## 2021-06-08 DIAGNOSIS — F319 Bipolar disorder, unspecified: Secondary | ICD-10-CM

## 2021-06-08 DIAGNOSIS — G4733 Obstructive sleep apnea (adult) (pediatric): Secondary | ICD-10-CM

## 2021-06-08 DIAGNOSIS — M797 Fibromyalgia: Secondary | ICD-10-CM | POA: Diagnosis not present

## 2021-06-08 DIAGNOSIS — G4719 Other hypersomnia: Secondary | ICD-10-CM | POA: Diagnosis not present

## 2021-06-08 MED ORDER — TIAGABINE HCL 4 MG PO TABS: 4.0000 mg | ORAL_TABLET | Freq: Every day | ORAL | 5 refills | Status: DC

## 2021-06-08 NOTE — Progress Notes (Signed)
[facility]  PATIENT: Tonya Clark DOB: [birth year]  REFERRING DOCTOR OR PCP: Nani Gasser MD SOURCE: Patient, notes from primary care, imaging and laboratory reports, MRI images personally reviewed.  _________________________________   HISTORICAL  CHIEF COMPLAINT:  Chief Complaint  Patient presents with   Follow-up    Room 16. Pt alone. Pt reports she has a lot of anxiety about her cpap machine and having a lot of trouble using it.     HISTORY OF PRESENT ILLNESS:  Tonya Clark is a 26 y.o. woman with OSA and weakness  Update 06/08/2021 She has severe OSA on 02/26/2021 PSG with AHI = 66.7.   She started APAP. But is having problems with it.   She is claustophobic and has difficulty falling asleep with it.   She takes Lunesta 2 mg which helps her fall asleep some.   She wakes up about 3-4 hours later and then wakes up freaked out with the facemask.  She then usually does the  second half of the night without CPAP.     She still feel sleepy during the daytime.  She sometimes takes naps.  At night she takes 2 mg Lunesta, 10 mg flexeril. And 5 mg melatonin  She has bipolar and ADD and is also on oxcarbazapine, and Vraylar and Vyvanse 40 mg.    Her strength is back to baseline  EPWORTH SLEEPINESS SCALE  On a scale of 0 - 3 what is the chance of dozing:  Sitting and Reading:   3 Watching TV:    3 Sitting inactive in a public place: 1 Passenger in car for one hour: 3 Lying down to rest in the afternoon: 3 Sitting and talking to someone: 0 Sitting quietly after lunch:  2 In a car, stopped in traffic:  0  Total (out of 24):  15/24  moderate ESS   (was 14 before CPAP).  Her weight is 314 (was 319)  HIstory of OSA/sleep issues She has sleepiness since January 2022.  She snores.   Her husband has noted pauses in breathing.   Many years ago she had a home sleep study which was normal.  Weight was 60 pounds less at that time.  She also reports insomnia.   She takes [place] with benefit.     She had severe OSA on PSG (AHI = 66) 02/26/2021 and was started on Auto-PAP   History of weakness: She began to experience intermittent weakness in the arms and legs and numbness in the hands and feet that started January 2022.   She reports the numbness is associated with tingling and is in her hands and feet, left = right.    It is intermittent and occurs without provocation or known association.   Separately, she experiences weakness that is also intermittent and occurs without known association.   Numbness is worse when she leans on something or puts pressure to the arm.    She also reports myalgia for 4-5 months.   She was prescribed Flexeril to take at bedtime.  Unfortunately, it did not help the myalgias.  Bladder function is fine.   Vision is stable. (Last eye exam in 2021).   She denies difficulty with gait or balance.       She has bipolar disease and is on oxcarbazepine and Vraylar.  She also takes Vyvanse.  She had an MRI of the brain without contrast 11/28/2017 when she was experience headaches and blurry vision.  I personally reviewed the images and they  were normal.  Laboratory test 10/24/2020 showed low normal B12 and normal TSH.   Labs 11/13/2019 showed normal HgbA1c.  She started on B12 shots every 2 weeks.     REVIEW OF SYSTEMS: Constitutional: No fevers, chills, sweats, or change in appetite Eyes: No visual changes, double vision, eye pain Ear, nose and throat: No hearing loss, ear pain, nasal congestion, sore throat Cardiovascular: No chest pain, palpitations Respiratory:  No shortness of breath at rest or with exertion.   No wheezes GastrointestinaI: No nausea, vomiting, diarrhea, abdominal pain, fecal incontinence Genitourinary:  No dysuria, urinary retention or frequency.  No nocturia. Musculoskeletal:  No neck pain, back pain Integumentary: No rash, pruritus, skin lesions Neurological: as above Psychiatric: No depression at this  time.  No anxiety Endocrine: No palpitations, diaphoresis, change in appetite, change in weigh or increased thirst Hematologic/Lymphatic:  No anemia, purpura, petechiae. Allergic/Immunologic: No itchy/runny eyes, nasal congestion, recent allergic reactions, rashes  ALLERGIES: Allergies  Allergen Reactions   Lamictal [Lamotrigine] Rash   Trazodone And Nefazodone Other (See Comments)    Pt states, "sleep paralysis"   Cymbalta [Duloxetine Hcl] Other (See Comments)    Suicidal ideation   Definity [Perflutren Lipid Microsphere] Nausea And Vomiting and Other (See Comments)    Nauseated, felt hot all over, with tingling in throat, tongue, hands and feet   Gabapentin Other (See Comments)    Hair Loss and weight gain    HOME MEDICATIONS:  Current Outpatient Medications:    eszopiclone (LUNESTA) 2 MG TABS tablet, Take 1 tablet by mouth at bedtime., Disp: , Rfl:    meloxicam (MOBIC) 15 MG tablet, Take 1 tablet (15 mg total) by mouth daily., Disp: 30 tablet, Rfl: 1   montelukast (SINGULAIR) 10 MG tablet, Take 1 tablet by mouth daily., Disp: , Rfl:    omeprazole (PRILOSEC) 40 MG capsule, Take by mouth every morning., Disp: , Rfl:    oxcarbazepine (TRILEPTAL) 600 MG tablet, Take 600 mg by mouth 3 (three) times daily., Disp: , Rfl:    tiaGABine (GABITRIL) 4 MG tablet, Take 1 tablet (4 mg total) by mouth at bedtime., Disp: 30 tablet, Rfl: 5   VRAYLAR 4.5 MG CAPS, Take 1 capsule by mouth daily., Disp: , Rfl:    VYVANSE 40 MG capsule, Take 40 mg by mouth every morning., Disp: , Rfl:    cyclobenzaprine (FLEXERIL) 10 MG tablet, TAKE 1 TABLET BY MOUTH EVERYDAY AT BEDTIME, Disp: 30 tablet, Rfl: 0   diltiazem (CARDIZEM CD) 240 MG 24 hr capsule, Take 1 capsule (240 mg total) by mouth daily., Disp: 90 capsule, Rfl: 3  PAST MEDICAL HISTORY: Past Medical History:  Diagnosis Date   Arrhythmia    Bipolar 1 disorder (HCC) 08/21/2013   Jaundice, neonatal    Kidney stone    Premature birth    9037 weeks     PAST SURGICAL HISTORY: Past Surgical History:  Procedure Laterality Date   TONSILLECTOMY  2009    FAMILY HISTORY: Family History  Problem Relation Age of Onset   Hypertension Mother    Allergies Mother    Anxiety disorder Father    Hypertension Father     SOCIAL HISTORY:  Social History   Socioeconomic History   Marital status: Married    Spouse name: Selena BattenCody   Number of children: Not on file   Years of education: Not on file   Highest education level: Bachelor's degree (e.g., BA, AB, BS)  Occupational History   Occupation: Student  Tobacco Use  Smoking status: Never   Smokeless tobacco: Never  Vaping Use   Vaping Use: Never used  Substance and Sexual Activity   Alcohol use: No   Drug use: No   Sexual activity: Not on file  Other Topics Concern   Not on file  Social History Narrative   Was home schooled for HS. She is now working at a [employer] 2-3 times per week. Born in [place].      Lives with husband   Right handed   Caffeine: 45mg  of coke zero a day          Social Determinants of Health   Financial Resource Strain: Not on file  Food Insecurity: Not on file  Transportation Needs: Not on file  Physical Activity: Not on file  Stress: Not on file  Social Connections: Not on file  Intimate Partner Violence: Not on file     PHYSICAL EXAM  Vitals:   06/08/21 1307  BP: (!) 170/96  Pulse: (!) 108  Weight: (!) 314 lb 6.4 oz (142.6 kg)  Height: 5\' 6"  (1.676 m)    Body mass index is 50.75 kg/m.   General: The patient is well-developed and well-nourished and in no acute distress  HEENT:  Head is [place]/AT.  Sclera are anicteric.    Skin: Extremities are without rash or  edema.  Musculoskeletal:  Tender in multiple classic FMS tender points of trunk and arms and nec  Neurologic Exam  Mental status: The patient is alert and oriented x 3 at the time of the examination. The patient has apparent normal recent and remote memory, with  an apparently normal attention span and concentration ability.   Speech is normal.  Cranial nerves: Extraocular movements are full.  Facial strength and sensation was normal.  Hearing seemed normal.  Motor:  Muscle bulk is normal.   Tone is normal. Strength is  5 / 5 in all 4 extremities.   Sensory: Sensory testing is intact to touch and vibration.  Coordination: Cerebellar testing reveals good finger-nose-finger and heel-to-shin bilaterally.  Gait and station: Station is normal.   Gait is normal. Tandem gait is normal. Romberg is negative.   Reflexes: Deep tendon reflexes are symmetric and normal bilaterally.   06/10/21    DIAGNOSTIC DATA (LABS, IMAGING, TESTING) - I reviewed patient records, labs, notes, testing and imaging myself where available.  Lab Results  Component Value Date   WBC 14.1 (H) 05/30/2017   HGB 12.4 05/30/2017   HCT 39.5 05/30/2017   MCV 74.4 (L) 05/30/2017   PLT 322 05/30/2017      Component Value Date/Time   NA 137 05/30/2017 1614   K 4.0 05/30/2017 1614   CL 103 05/30/2017 1614   CO2 22 05/30/2017 1614   GLUCOSE 87 05/30/2017 1614   BUN 9 05/30/2017 1614   CREATININE 0.82 05/30/2017 1614   CREATININE 0.71 08/25/2015 1401   CALCIUM 9.5 05/30/2017 1614   PROT 7.9 05/30/2017 1614   ALBUMIN 4.3 05/30/2017 1614   AST 20 05/30/2017 1614   ALT 16 05/30/2017 1614   ALKPHOS 68 05/30/2017 1614   BILITOT 0.7 05/30/2017 1614   GFRNONAA >60 05/30/2017 1614   GFRNONAA >89 08/25/2015 1401   GFRAA >60 05/30/2017 1614   GFRAA >89 08/25/2015 1401   Lab Results  Component Value Date   CHOL 148 09/05/2017   HDL 32 (A) 09/05/2017   LDLCALC 95 09/05/2017   TRIG 105 09/05/2017   Lab Results  Component Value  Date   HGBA1C 5.6 11/13/2019   Lab Results  Component Value Date   VITAMINB12 395 01/19/2021   Lab Results  Component Value Date   TSH 0.63 01/19/2021       ASSESSMENT AND PLAN  OSA (obstructive sleep apnea)  Fibromyalgia  Morbid obesity  (HCC)  Excessive daytime sleepiness  Bipolar 1 disorder (HCC)   She did not have her CPAP machine with her and we were unable to download how she has done.  She is advised to bring her machine for least the chip this can be determined.  We will need to determine if she is getting a benefit from the CPAP when she is using it.  If she has emergent central apneas we may need to set her up for BiPAP.Marland Kitchen.  If she is unable to tolerate CPAP she may need referral to ENT for surgery. Trial of Gabitril nightly to see if that helps her sleep quality and allows her to use CPAP longer.  She is already on Lunesta and Flexeril nightly Stay active and exercise as tolerated.  Try to lose more weight. She has excessive daytime sleepiness but is already on Vyvanse.  Therefore, it is unlikely that the addition of Nuvigil or Provigil would help much.   Return in 6 months or sooner if there are new or worsening neurologic symptoms.    Bunnie Lederman A. Epimenio FootSater, MD, [facility],FAAN 06/08/2021, 7:52 PM Certified in Neurology, Clinical Neurophysiology, Sleep Medicine and Neuroimaging  [facility] [address], [address] [place], [place] [address] [phone]

## 2021-06-11 ENCOUNTER — Encounter: Payer: Self-pay | Admitting: Family Medicine

## 2021-06-11 ENCOUNTER — Telehealth (INDEPENDENT_AMBULATORY_CARE_PROVIDER_SITE_OTHER): Payer: BC Managed Care – PPO | Admitting: Family Medicine

## 2021-06-11 VITALS — Temp 97.8°F | Ht 66.0 in | Wt 315.0 lb

## 2021-06-11 DIAGNOSIS — J069 Acute upper respiratory infection, unspecified: Secondary | ICD-10-CM

## 2021-06-11 MED ORDER — BENZONATATE 200 MG PO CAPS: 200.0000 mg | ORAL_CAPSULE | Freq: Three times a day (TID) | ORAL | 0 refills | Status: DC | PRN

## 2021-06-11 MED ORDER — HYDROCODONE BIT-HOMATROP MBR 5-1.5 MG/5ML PO SOLN: 5.0000 mL | Freq: Every evening | ORAL | 0 refills | Status: DC | PRN

## 2021-06-11 NOTE — Progress Notes (Signed)
° ° °  Virtual Visit via Video Note  I connected with Clorinda Wyble on 06/11/21 at  4:20 PM EST by a video enabled telemedicine application and verified that I am speaking with the correct person using two identifiers.   I discussed the limitations of evaluation and management by telemedicine and the availability of in person appointments. The patient expressed understanding and agreed to proceed.  Patient location: at home Provider location: in office  Subjective:    CC:   Chief Complaint  Patient presents with   Sore throat    Loss of voice, neck pain, productive cough and nasal congestion, 2 days. Patient did not take covid test.     HPI: Loss of voice, neck pain, productive cough and nasal congestion, 2 days. Patient did not take covid test. No fever or chills. No GI sxs.  Using cough drops and DayQuil.Cough is worse at night and keeping her awake.     Past medical history, Surgical history, Family history not pertinant except as noted below, Social history, Allergies, and medications have been entered into the medical record, reviewed, and corrections made.    Objective:    General: Speaking clearly in complete sentences without any shortness of breath.  Alert and oriented x3.  Normal judgment. No apparent acute distress.    Impression and Recommendations:    Problem List Items Addressed This Visit   None Visit Diagnoses     Viral upper respiratory tract infection    -  Primary   Relevant Medications   benzonatate (TESSALON) 200 MG capsule   HYDROcodone bit-homatropine (HYCODAN) 5-1.5 MG/5ML syrup       Upper respiratory infection-most likely viral.  Did encourage her to test for COVID.  If positive please let us know.  Otherwise we will treat symptomatically for cough with Tessalon Perles and nighttime cough syrup.  If she is not feeling better after the weekend please let us know.  Recommend using a humidifier.  No orders of the defined types were placed in this  encounter.   Meds ordered this encounter  Medications   benzonatate (TESSALON) 200 MG capsule    Sig: Take 1 capsule (200 mg total) by mouth 3 (three) times daily as needed for cough.    Dispense:  30 capsule    Refill:  0   HYDROcodone bit-homatropine (HYCODAN) 5-1.5 MG/5ML syrup    Sig: Take 5-10 mLs by mouth at bedtime as needed for cough.    Dispense:  60 mL    Refill:  0     I discussed the assessment and treatment plan with the patient. The patient was provided an opportunity to ask questions and all were answered. The patient agreed with the plan and demonstrated an understanding of the instructions.   The patient was advised to call back or seek an in-person evaluation if the symptoms worsen or if the condition fails to improve as anticipated.   Nani Gasser, MD

## 2021-06-15 DIAGNOSIS — G4733 Obstructive sleep apnea (adult) (pediatric): Secondary | ICD-10-CM | POA: Diagnosis not present

## 2021-06-18 DIAGNOSIS — G4733 Obstructive sleep apnea (adult) (pediatric): Secondary | ICD-10-CM | POA: Diagnosis not present

## 2021-06-22 NOTE — Telephone Encounter (Signed)
Medication: AJOVY 225 MG/1.5ML SOAJ Prior authorization determination received ON 04/03/2021 Medication has been approved Approval dates: 04/03/2021-04/03/2022

## 2021-06-25 ENCOUNTER — Encounter: Payer: Self-pay | Admitting: Family Medicine

## 2021-07-05 ENCOUNTER — Encounter: Payer: Self-pay | Admitting: Family Medicine

## 2021-07-16 DIAGNOSIS — G4733 Obstructive sleep apnea (adult) (pediatric): Secondary | ICD-10-CM | POA: Diagnosis not present

## 2021-07-19 DIAGNOSIS — G4733 Obstructive sleep apnea (adult) (pediatric): Secondary | ICD-10-CM | POA: Diagnosis not present

## 2021-07-21 ENCOUNTER — Other Ambulatory Visit: Payer: Self-pay | Admitting: Family Medicine

## 2021-07-21 DIAGNOSIS — M791 Myalgia, unspecified site: Secondary | ICD-10-CM

## 2021-07-22 ENCOUNTER — Other Ambulatory Visit: Payer: Self-pay

## 2021-07-22 ENCOUNTER — Ambulatory Visit (INDEPENDENT_AMBULATORY_CARE_PROVIDER_SITE_OTHER): Payer: BC Managed Care – PPO | Admitting: Cardiology

## 2021-07-22 ENCOUNTER — Encounter: Payer: Self-pay | Admitting: Cardiology

## 2021-07-22 VITALS — BP 140/80 | HR 100 | Ht 66.0 in | Wt 320.4 lb

## 2021-07-22 DIAGNOSIS — G4733 Obstructive sleep apnea (adult) (pediatric): Secondary | ICD-10-CM | POA: Diagnosis not present

## 2021-07-22 DIAGNOSIS — I10 Essential (primary) hypertension: Secondary | ICD-10-CM

## 2021-07-22 NOTE — Patient Instructions (Addendum)
Medication Instructions:  ?Your physician recommends that you continue on your current medications as directed. Please refer to the Current Medication list given to you today.  ? ?Please take your blood pressure daily for 2 weeks and send in a MyChart message. Please include heart rates.  ? ?*If you need a refill on your cardiac medications before your next appointment, please call your pharmacy* ? ? ?Lab Work: ?None ?If you have labs (blood work) drawn today and your tests are completely normal, you will receive your results only by: ?MyChart Message (if you have MyChart) OR ?A paper copy in the mail ?If you have any lab test that is abnormal or we need to change your treatment, we will call you to review the results. ? ? ?Testing/Procedures: ?None ? ? ?Follow-Up: ?At [facility], you and your health needs are our priority.  As part of our continuing mission to provide you with exceptional heart care, we have created designated Provider Care Teams.  These Care Teams include your primary Cardiologist (physician) and Advanced Practice Providers (APPs -  Physician Assistants and Nurse Practitioners) who all work together to provide you with the care you need, when you need it. ? ?We recommend signing up for the patient portal called "MyChart".  Sign up information is provided on this After Visit Summary.  MyChart is used to connect with patients for Virtual Visits (Telemedicine).  Patients are able to view lab/test results, encounter notes, upcoming appointments, etc.  Non-urgent messages can be sent to your provider as well.   ?To learn more about what you can do with MyChart, go to [email or web address].   ? ?Your next appointment:   ?6 month(s) ? ?The format for your next appointment:   ?In Person ? ?Provider:   ?[name], DO   ? ? ?Other Instructions ?  ?

## 2021-07-22 NOTE — Progress Notes (Signed)
Cardiology Office Note:    Date:  07/27/2021   ID:  Tonya EdwardsMichaela Clark, DOB 08-Mar-1996, MRN [id number]  PCP:  Agapito GamesMetheney, [name], MD  Cardiologist:  Thomasene RippleKardie Jasson Siegmann, DO  Electrophysiologist:  None   Referring MD: Agapito GamesMetheney, [name], *   I am doing well   History of Present Illness:    Tonya Clark is a 26 y.o. female with a hx of hypertension, morbid obesity, history of COVID-19 infection, obstructive sleep diabetes.  I last saw the patient on November 26, 2020 at that time I increased her diltiazem to 240 mg daily.  This has helped her palpitations.  During that visit we talked about weight loss strategy however she declined referral to medical weight management program.  Since I saw the patient she has had a sleep study which showed OSA and has not been started on CPAP which is being managed by neurology.  She is doing well.  She does not have any significant concerns.  Past Medical History:  Diagnosis Date   Arrhythmia    Bipolar 1 disorder (HCC) 08/21/2013   Jaundice, neonatal    Kidney stone    Premature birth    137 weeks    Past Surgical History:  Procedure Laterality Date   TONSILLECTOMY  2009    Current Medications: Current Meds  Medication Sig   eszopiclone (LUNESTA) 2 MG TABS tablet Take 1 tablet by mouth at bedtime.   LYBALVI 10-10 MG TABS Take 10 mg by mouth daily.   montelukast (SINGULAIR) 10 MG tablet Take 1 tablet by mouth daily.   omeprazole (PRILOSEC) 40 MG capsule Take by mouth every morning.   oxcarbazepine (TRILEPTAL) 600 MG tablet Take 600 mg by mouth 3 (three) times daily.   propranolol (INDERAL) 20 MG tablet Take 20 mg by mouth daily as needed.   VYVANSE 40 MG capsule Take 40 mg by mouth every morning.   [DISCONTINUED] cyclobenzaprine (FLEXERIL) 10 MG tablet TAKE 1 TABLET BY MOUTH EVERYDAY AT BEDTIME     Allergies:   Lamictal [lamotrigine], Trazodone and nefazodone, Cymbalta [duloxetine hcl], Definity [perflutren lipid microsphere], and Gabapentin    Social History   Socioeconomic History   Marital status: Married    Spouse name: Selena BattenCody   Number of children: Not on file   Years of education: Not on file   Highest education level: Bachelor's degree (e.g., BA, AB, BS)  Occupational History   Occupation: Student  Tobacco Use   Smoking status: Never   Smokeless tobacco: Never  Vaping Use   Vaping Use: Never used  Substance and Sexual Activity   Alcohol use: No   Drug use: No   Sexual activity: Not on file  Other Topics Concern   Not on file  Social History Narrative   Was home schooled for HS. She is now working at a [employer] 2-3 times per week. Born in [place].      Lives with husband   Right handed   Caffeine: 45mg  of coke zero a day          Social Determinants of Health   Financial Resource Strain: Not on file  Food Insecurity: Not on file  Transportation Needs: Not on file  Physical Activity: Not on file  Stress: Not on file  Social Connections: Not on file     Family History: The patient's family history includes Allergies in her mother; Anxiety disorder in her father; Hypertension in her father and mother.  ROS:   Review of  Systems  Constitution: Negative for decreased appetite, fever and weight gain.  HENT: Negative for congestion, ear discharge, hoarse voice and sore throat.   Eyes: Negative for discharge, redness, vision loss in right eye and visual halos.  Cardiovascular: Negative for chest pain, dyspnea on exertion, leg swelling, orthopnea and palpitations.  Respiratory: Negative for cough, hemoptysis, shortness of breath and snoring.   Endocrine: Negative for heat intolerance and polyphagia.  Hematologic/Lymphatic: Negative for bleeding problem. Does not bruise/bleed easily.  Skin: Negative for flushing, nail changes, rash and suspicious lesions.  Musculoskeletal: Negative for arthritis, joint pain, muscle cramps, myalgias, neck pain and stiffness.  Gastrointestinal: Negative for  abdominal pain, bowel incontinence, diarrhea and excessive appetite.  Genitourinary: Negative for decreased libido, genital sores and incomplete emptying.  Neurological: Negative for brief paralysis, focal weakness, headaches and loss of balance.  Psychiatric/Behavioral: Negative for altered mental status, depression and suicidal ideas.  Allergic/Immunologic: Negative for HIV exposure and persistent infections.    EKGs/Labs/Other Studies Reviewed:    The following studies were reviewed today:   EKG: None today  Transthoracic echocardiogram May 2022 IMPRESSIONS     1. Left ventricular ejection fraction, by estimation, is 60 to 65%. The  left ventricle has normal function. Left ventricular endocardial border  not optimally defined to evaluate regional wall motion. There is mild  concentric left ventricular  hypertrophy. Left ventricular diastolic parameters were normal.   2. Right ventricular systolic function is normal. The right ventricular  size is normal.   3. The mitral valve is normal in structure. No evidence of mitral valve  regurgitation. No evidence of mitral stenosis.   4. The aortic valve is tricuspid. Aortic valve regurgitation is not  visualized. No aortic stenosis is present.   5. The inferior vena cava is normal in size with greater than 50%  respiratory variability, suggesting right atrial pressure of 3 mmHg.   FINDINGS   Left Ventricle: Left ventricular ejection fraction, by estimation, is 60  to 65%. The left ventricle has normal function. Left ventricular  endocardial border not optimally defined to evaluate regional wall motion.  Definity contrast agent was given IV to  delineate the left ventricular endocardial borders. The left ventricular  internal cavity size was normal in size. There is mild concentric left  ventricular hypertrophy. Left ventricular diastolic parameters were  normal. Normal left ventricular filling  pressure.   Right Ventricle: The  right ventricular size is normal. No increase in  right ventricular wall thickness. Right ventricular systolic function is  normal.   Left Atrium: Left atrial size was normal in size.   Right Atrium: Right atrial size was normal in size.   Pericardium: There is no evidence of pericardial effusion.   Mitral Valve: The mitral valve is normal in structure. No evidence of  mitral valve regurgitation. No evidence of mitral valve stenosis.   Tricuspid Valve: The tricuspid valve is normal in structure. Tricuspid  valve regurgitation is trivial. No evidence of tricuspid stenosis.   Aortic Valve: The aortic valve is tricuspid. Aortic valve regurgitation is  not visualized. No aortic stenosis is present. Aortic valve mean gradient  measures 5.0 mmHg. Aortic valve peak gradient measures 11.3 mmHg. Aortic  valve area, by VTI measures 2.52   cm.   Pulmonic Valve: The pulmonic valve was normal in structure. Pulmonic valve  regurgitation is not visualized. No evidence of pulmonic stenosis.   Aorta: The aortic root, ascending aorta and aortic arch are all  structurally normal, with no evidence  of dilitation or obstruction.   Venous: The pulmonary veins were not well visualized. The inferior vena  cava was not well visualized. The inferior vena cava is normal in size  with greater than 50% respiratory variability, suggesting right atrial  pressure of 3 mmHg.   IAS/Shunts: No atrial level shunt detected by color flow Doppler.  Recent Labs: 01/19/2021: Magnesium 2.1; TSH 0.63  Recent Lipid Panel    Component Value Date/Time   CHOL 148 09/05/2017 0000   TRIG 105 09/05/2017 0000   HDL 32 (A) 09/05/2017 0000   LDLCALC 95 09/05/2017 0000    Physical Exam:    VS:  BP 140/80 (BP Location: Left Arm, Patient Position: Sitting)    Pulse 100    Ht 5\' 6"  (1.676 m)    Wt (!) 320 lb 6.4 oz (145.3 kg)    SpO2 98%    BMI 51.71 kg/m     Wt Readings from Last 3 Encounters:  07/22/21 (!) 320 lb 6.4 oz  (145.3 kg)  06/11/21 (!) 315 lb (142.9 kg)  06/08/21 (!) 314 lb 6.4 oz (142.6 kg)     GEN: Well nourished, well developed in no acute distress HEENT: Normal NECK: No JVD; No carotid bruits LYMPHATICS: No lymphadenopathy CARDIAC: S1S2 noted,RRR, no murmurs, rubs, gallops RESPIRATORY:  Clear to auscultation without rales, wheezing or rhonchi  ABDOMEN: Soft, non-tender, non-distended, +bowel sounds, no guarding. EXTREMITIES: No edema, No cyanosis, no clubbing MUSCULOSKELETAL:  No deformity  SKIN: Warm and dry NEUROLOGIC:  Alert and oriented x 3, non-focal PSYCHIATRIC:  Normal affect, good insight  ASSESSMENT:    1. Essential hypertension   2. Morbid obesity (HCC)   3. OSA (obstructive sleep apnea)    PLAN:     She is hypertensive in the office today.  We have discussed that she will take her blood pressure daily and send me the information for the next 2 weeks.  She does note that her blood pressure has been within limits recently.   Continue CPAP  The patient understands the need to lose weight with diet and exercise. We have discussed specific strategies for this.  The patient is in agreement with the above plan. The patient left the office in stable condition.  The patient will follow up in   Medication Adjustments/Labs and Tests Ordered: Current medicines are reviewed at length with the patient today.  Concerns regarding medicines are outlined above.  No orders of the defined types were placed in this encounter.  No orders of the defined types were placed in this encounter.   Patient Instructions  Medication Instructions:  Your physician recommends that you continue on your current medications as directed. Please refer to the Current Medication list given to you today.   Please take your blood pressure daily for 2 weeks and send in a MyChart message. Please include heart rates.   *If you need a refill on your cardiac medications before your next appointment, please  call your pharmacy*   Lab Work: None If you have labs (blood work) drawn today and your tests are completely normal, you will receive your results only by: MyChart Message (if you have MyChart) OR A paper copy in the mail If you have any lab test that is abnormal or we need to change your treatment, we will call you to review the results.   Testing/Procedures: None   Follow-Up: At [facility], you and your health needs are our priority.  As part of our continuing mission to  provide you with exceptional heart care, we have created designated Provider Care Teams.  These Care Teams include your primary Cardiologist (physician) and Advanced Practice Providers (APPs -  Physician Assistants and Nurse Practitioners) who all work together to provide you with the care you need, when you need it.  We recommend signing up for the patient portal called "MyChart".  Sign up information is provided on this After Visit Summary.  MyChart is used to connect with patients for Virtual Visits (Telemedicine).  Patients are able to view lab/test results, encounter notes, upcoming appointments, etc.  Non-urgent messages can be sent to your provider as well.   To learn more about what you can do with MyChart, go to [email or web address].    Your next appointment:   6 month(s)  The format for your next appointment:   In Person  Provider:   [name], DO     Other Instructions     Adopting a Healthy Lifestyle.  Know what a healthy weight is for you (roughly BMI <25) and aim to maintain this   Aim for 7+ servings of fruits and vegetables daily   65-80+ fluid ounces of water or unsweet tea for healthy kidneys   Limit to max 1 drink of alcohol per day; avoid smoking/tobacco   Limit animal fats in diet for cholesterol and heart health - choose grass fed whenever available   Avoid highly processed foods, and foods high in saturated/trans fats   Aim for low stress - take time to unwind and care  for your mental health   Aim for 150 min of moderate intensity exercise weekly for heart health, and weights twice weekly for bone health   Aim for 7-9 hours of sleep daily   When it comes to diets, agreement about the perfect plan isnt easy to find, even among the experts. Experts at the [facility] developed an idea known as the Healthy Eating Plate. Just imagine a plate divided into logical, healthy portions.   The emphasis is on diet quality:   Load up on vegetables and fruits - one-half of your plate: Aim for color and variety, and remember that potatoes dont count.   Go for whole grains - one-quarter of your plate: Whole wheat, barley, wheat berries, quinoa, oats, brown rice, and foods made with them. If you want pasta, go with whole wheat pasta.   Protein power - one-quarter of your plate: Fish, chicken, beans, and nuts are all healthy, versatile protein sources. Limit red meat.   The diet, however, does go beyond the plate, offering a few other suggestions.   Use healthy plant oils, such as olive, canola, soy, corn, sunflower and peanut. Check the labels, and avoid partially hydrogenated oil, which have unhealthy trans fats.   If youre thirsty, drink water. Coffee and tea are good in moderation, but skip sugary drinks and limit milk and dairy products to one or two daily servings.   The type of carbohydrate in the diet is more important than the amount. Some sources of carbohydrates, such as vegetables, fruits, whole grains, and beans-are healthier than others.   Finally, stay active  Signed, [name], DO  07/27/2021 11:36 AM     [facility]

## 2021-08-07 ENCOUNTER — Other Ambulatory Visit: Payer: Self-pay

## 2021-08-07 ENCOUNTER — Ambulatory Visit: Payer: BC Managed Care – PPO | Admitting: Family Medicine

## 2021-08-07 ENCOUNTER — Encounter: Payer: Self-pay | Admitting: Family Medicine

## 2021-08-07 VITALS — BP 138/88 | HR 103 | Resp 18 | Ht 66.0 in | Wt 308.0 lb

## 2021-08-07 DIAGNOSIS — J029 Acute pharyngitis, unspecified: Secondary | ICD-10-CM

## 2021-08-07 DIAGNOSIS — J020 Streptococcal pharyngitis: Secondary | ICD-10-CM

## 2021-08-07 LAB — POCT RAPID STREP A (OFFICE): Rapid Strep A Screen: POSITIVE — AB

## 2021-08-07 LAB — POCT INFLUENZA A/B
Influenza A, POC: NEGATIVE
Influenza B, POC: NEGATIVE

## 2021-08-07 MED ORDER — ONDANSETRON HCL 4 MG PO TABS: 4.0000 mg | ORAL_TABLET | Freq: Three times a day (TID) | ORAL | 0 refills | Status: DC | PRN

## 2021-08-07 MED ORDER — PENICILLIN V POTASSIUM 500 MG PO TABS: 500.0000 mg | ORAL_TABLET | Freq: Two times a day (BID) | ORAL | 0 refills | Status: AC

## 2021-08-07 NOTE — Progress Notes (Signed)
? ?Acute Office Visit ? ?Subjective:  ? ? Patient ID: Tonya Clark, female    DOB: 29-Dec-1995, 26 y.o.   MRN: [id number] ? ?Chief Complaint  ?Patient presents with  ? Sore Throat  ?  Body aches, headaches and chills for 4 days. Covid test negative on 08/06/21.   ? ? ?HPI ?Patient is in today for ST, bodyaches and chills x 4 days.  Neg COVID test.  She says it actually started initially with congestion.  She has not had any cough.  The throat is been painful enough that she has not wanted to eat or drink much.  She says on Wednesday her heart rate had jumped up to 130s and 140s and when she got home she checked her temperature and it was 102.  On Thursday her temperature was around 100. She is a Engineer, site ? ?Weight is down 12 lbs.   ? ? ?Past Medical History:  ?Diagnosis Date  ? Arrhythmia   ? Bipolar 1 disorder (HCC) 08/21/2013  ? Jaundice, neonatal   ? Kidney stone   ? Premature birth   ? 37 weeks  ? ? ?Past Surgical History:  ?Procedure Laterality Date  ? TONSILLECTOMY  2009  ? ? ?Family History  ?Problem Relation Age of Onset  ? Hypertension Mother   ? Allergies Mother   ? Anxiety disorder Father   ? Hypertension Father   ? ? ?Social History  ? ?Socioeconomic History  ? Marital status: Married  ?  Spouse name: [name]  ? Number of children: Not on file  ? Years of education: Not on file  ? Highest education level: Bachelor's degree (e.g., BA, AB, BS)  ?Occupational History  ? Occupation: Consulting civil engineer  ?Tobacco Use  ? Smoking status: Never  ? Smokeless tobacco: Never  ?Vaping Use  ? Vaping Use: Never used  ?Substance and Sexual Activity  ? Alcohol use: No  ? Drug use: No  ? Sexual activity: Not on file  ?Other Topics Concern  ? Not on file  ?Social History Narrative  ? Was home schooled for HS. She is now working at a [employer] 2-3 times per week. Born in [place].  ?   ? Lives with husband  ? Right handed  ? Caffeine: 45mg  of coke zero a day  ?   ?    ? ?Social Determinants of Health  ? ?Financial  Resource Strain: Not on file  ?Food Insecurity: Not on file  ?Transportation Needs: Not on file  ?Physical Activity: Not on file  ?Stress: Not on file  ?Social Connections: Not on file  ?Intimate Partner Violence: Not on file  ? ? ?Outpatient Medications Prior to Visit  ?Medication Sig Dispense Refill  ? cyclobenzaprine (FLEXERIL) 10 MG tablet TAKE 1 TABLET BY MOUTH EVERYDAY AT BEDTIME 30 tablet 0  ? eszopiclone (LUNESTA) 2 MG TABS tablet Take 1 tablet by mouth at bedtime.    ? LYBALVI 10-10 MG TABS Take 10 mg by mouth daily.    ? montelukast (SINGULAIR) 10 MG tablet Take 1 tablet by mouth daily.    ? omeprazole (PRILOSEC) 40 MG capsule Take by mouth every morning.    ? oxcarbazepine (TRILEPTAL) 600 MG tablet Take 600 mg by mouth 3 (three) times daily.    ? propranolol (INDERAL) 20 MG tablet Take 20 mg by mouth daily as needed.    ? VYVANSE 40 MG capsule Take 40 mg by mouth every morning.    ? diltiazem (CARDIZEM CD)  240 MG 24 hr capsule Take 1 capsule (240 mg total) by mouth daily. 90 capsule 3  ? benzonatate (TESSALON) 200 MG capsule Take 1 capsule (200 mg total) by mouth 3 (three) times daily as needed for cough. (Patient not taking: Reported on 07/22/2021) 30 capsule 0  ? HYDROcodone bit-homatropine (HYCODAN) 5-1.5 MG/5ML syrup Take 5-10 mLs by mouth at bedtime as needed for cough. (Patient not taking: Reported on 07/22/2021) 60 mL 0  ? meloxicam (MOBIC) 15 MG tablet Take 1 tablet (15 mg total) by mouth daily. (Patient not taking: Reported on 07/22/2021) 30 tablet 1  ? tiaGABine (GABITRIL) 4 MG tablet Take 1 tablet (4 mg total) by mouth at bedtime. (Patient not taking: Reported on 06/11/2021) 30 tablet 5  ? VRAYLAR 4.5 MG CAPS Take 1 capsule by mouth daily. (Patient not taking: Reported on 07/22/2021)    ? ?No facility-administered medications prior to visit.  ? ? ?Allergies  ?Allergen Reactions  ? Lamictal [Lamotrigine] Rash  ? Trazodone And Nefazodone Other (See Comments)  ?  Pt states, "sleep paralysis"  ? Cymbalta  [Duloxetine Hcl] Other (See Comments)  ?  Suicidal ideation  ? Definity [Perflutren Lipid Microsphere] Nausea And Vomiting and Other (See Comments)  ?  Nauseated, felt hot all over, with tingling in throat, tongue, hands and feet  ? Gabapentin Other (See Comments)  ?  Hair Loss and weight gain  ? ? ?Review of Systems ? ?   ?Objective:  ?  ?Physical Exam ?Constitutional:   ?   Appearance: She is well-developed.  ?HENT:  ?   Head: Normocephalic and atraumatic.  ?   Right Ear: Tympanic membrane, ear canal and external ear normal.  ?   Left Ear: Tympanic membrane, ear canal and external ear normal.  ?   Nose: Nose normal.  ?   Mouth/Throat:  ?   Pharynx: Posterior oropharyngeal erythema present.  ?Eyes:  ?   Conjunctiva/sclera: Conjunctivae normal.  ?   Pupils: Pupils are equal, round, and reactive to light.  ?Neck:  ?   Thyroid: No thyromegaly.  ?Cardiovascular:  ?   Rate and Rhythm: Normal rate and regular rhythm.  ?   Heart sounds: Normal heart sounds.  ?Pulmonary:  ?   Effort: Pulmonary effort is normal.  ?   Breath sounds: Normal breath sounds. No wheezing.  ?Musculoskeletal:  ?   Cervical back: Neck supple.  ?Lymphadenopathy:  ?   Cervical: No cervical adenopathy.  ?Skin: ?   General: Skin is warm and dry.  ?Neurological:  ?   Mental Status: She is alert and oriented to person, place, and time.  ? ? ?BP 138/88 (BP Location: Left Arm)   Pulse (!) 103   Resp 18   Ht 5\' 6"  (1.676 m)   Wt (!) 308 lb (139.7 kg)   LMP 07/26/2021   SpO2 96%   BMI 49.71 kg/m?  ?Wt Readings from Last 3 Encounters:  ?08/07/21 (!) 308 lb (139.7 kg)  ?07/22/21 (!) 320 lb 6.4 oz (145.3 kg)  ?06/11/21 (!) 315 lb (142.9 kg)  ? ? ?Health Maintenance Due  ?Topic Date Due  ? HIV Screening  Never done  ? Hepatitis C Screening  Never done  ? COVID-19 Vaccine (3 - Booster for Moderna series) 10/27/2019  ? ? ?There are no preventive care reminders to display for this patient. ? ? ?Lab Results  ?Component Value Date  ? TSH 0.63 01/19/2021  ? ?Lab  Results  ?Component Value Date  ?  WBC 14.1 (H) 05/30/2017  ? HGB 12.4 05/30/2017  ? HCT 39.5 05/30/2017  ? MCV 74.4 (L) 05/30/2017  ? PLT 322 05/30/2017  ? ?Lab Results  ?Component Value Date  ? NA 137 05/30/2017  ? K 4.0 05/30/2017  ? CO2 22 05/30/2017  ? GLUCOSE 87 05/30/2017  ? BUN 9 05/30/2017  ? CREATININE 0.82 05/30/2017  ? BILITOT 0.7 05/30/2017  ? ALKPHOS 68 05/30/2017  ? AST 20 05/30/2017  ? ALT 16 05/30/2017  ? PROT 7.9 05/30/2017  ? ALBUMIN 4.3 05/30/2017  ? CALCIUM 9.5 05/30/2017  ? ANIONGAP 12 05/30/2017  ? ?Lab Results  ?Component Value Date  ? CHOL 148 09/05/2017  ? ?Lab Results  ?Component Value Date  ? HDL 32 (A) 09/05/2017  ? ?Lab Results  ?Component Value Date  ? LDLCALC 95 09/05/2017  ? ?Lab Results  ?Component Value Date  ? TRIG 105 09/05/2017  ? ?No results found for: CHOLHDL ?Lab Results  ?Component Value Date  ? HGBA1C 5.6 11/13/2019  ? ? ?   ?Assessment & Plan:  ? ?Problem List Items Addressed This Visit   ?None ?Visit Diagnoses   ? ? Sore throat    -  Primary  ? Relevant Orders  ? POCT rapid strep A (Completed)  ? POCT Influenza A/B (Completed)  ? Strep throat      ? ?  ? ?Nasal congestion with fever and sore throat-we did test for flu just to make sure but it was negative.  But being that her sore throat just started yesterday and is severe we tested for strep throat.  And it was positive.  We will go ahead and treat with Pen-Vee K and gave some Zofran for nausea and for as needed use.  Make sure you are staying well-hydrated.  Call if not better after the weekend.  I do suspect she has a combination of a URI and strep throat. ? ?Meds ordered this encounter  ?Medications  ? penicillin v potassium (VEETID) 500 MG tablet  ?  Sig: Take 1 tablet (500 mg total) by mouth in the morning and at bedtime for 10 days.  ?  Dispense:  20 tablet  ?  Refill:  0  ? ondansetron (ZOFRAN) 4 MG tablet  ?  Sig: Take 1 tablet (4 mg total) by mouth every 8 (eight) hours as needed for nausea or vomiting.  ?   Dispense:  20 tablet  ?  Refill:  0  ? ? ? ?Nani Gasseratherine Jabier Deese, MD ? ?

## 2021-08-10 ENCOUNTER — Other Ambulatory Visit: Payer: Self-pay

## 2021-08-10 ENCOUNTER — Ambulatory Visit (INDEPENDENT_AMBULATORY_CARE_PROVIDER_SITE_OTHER): Payer: BC Managed Care – PPO | Admitting: Obstetrics & Gynecology

## 2021-08-10 ENCOUNTER — Encounter: Payer: Self-pay | Admitting: Obstetrics & Gynecology

## 2021-08-10 VITALS — BP 153/75 | HR 93 | Ht 66.0 in | Wt 307.0 lb

## 2021-08-10 DIAGNOSIS — N979 Female infertility, unspecified: Secondary | ICD-10-CM

## 2021-08-10 DIAGNOSIS — N926 Irregular menstruation, unspecified: Secondary | ICD-10-CM

## 2021-08-10 NOTE — Progress Notes (Signed)
? ?GYNECOLOGY OFFICE VISIT NOTE ? ?History:  ? Tonya Clark is a 26 y.o. G0P0000 here today for evaluation of irregular menstrual periods and infertility. History of irregular menses since menarche, has used Junel in the past which helped but she stopped this in 02/2021.  Has multiple co-morbidities, followed by PCP and Cardiology. She is interested in knowing if she has PCOS; she has never been diagnosed with this but has been on metformin in the past which made her "sick". She is working on weight loss, has lost over 10 pounds recently.  Husband has also been diagnosed with low testosterone, and is working on weight loss too; has not done a semen analysis. She denies any abnormal vaginal discharge, bleeding, pelvic pain or other concerns.  ?  ? ?Patient Active Problem List  ? Diagnosis Date Noted  ? OSA (obstructive sleep apnea) 02/27/2021  ? IFG (impaired fasting glucose) 01/19/2021  ? Numbness and tingling 12/23/2020  ? Snoring 12/23/2020  ? Excessive daytime sleepiness 12/23/2020  ? Fibromyalgia 12/01/2020  ? Sinus tachycardia 10/27/2020  ? History of COVID-19 10/27/2020  ? Morbid obesity ([place]) 10/27/2020  ? Essential hypertension 10/24/2020  ? B12 deficiency 10/24/2020  ? Arrhythmia   ? Jaundice, neonatal   ? Kidney stone   ? Premature birth   ? 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 BMI 45.0-49.9, adult ([place]) 11/13/2019  ? Migraine headache 01/04/2019  ? New daily persistent headache 11/25/2017  ? Moderate right ankle sprain 10/15/2016  ? PAC (premature atrial contraction) 10/09/2015  ? PVC (premature ventricular contraction) 10/09/2015  ? 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 Bipolar 1 disorder ([place]) 08/21/2013  ? Insomnia 08/21/2013  ? ? ?Past Medical History:  ?Diagnosis Date  ? Arrhythmia   ? Bipolar 1 disorder ([place]) 08/21/2013  ? Jaundice, neonatal   ? Kidney stone   ? Premature birth   ? 37 weeks  ? ? ?Past Surgical History:  ?Procedure Laterality Date  ?  TONSILLECTOMY  2009  ? ? ?The following portions of the patient's history were reviewed and updated as appropriate: allergies, current medications, past family history, past medical history, past social history, past surgical history and problem list.  ? ?Health Maintenance:  Normal pap on  11/13/2019.  ? ?Review of Systems:  ?Pertinent items noted in HPI and remainder of comprehensive ROS otherwise negative. ? ?Physical Exam:  ?BP (!) 153/75   Pulse 93   Ht 5\' 6"  (1.676 m)   Wt (!) 307 lb (139.3 kg)   LMP 07/26/2021   BMI 49.55 kg/m?  ?CONSTITUTIONAL: Well-developed, well-nourished female in no acute distress.  ?HEENT:  Normocephalic, atraumatic. External right and left ear normal. No scleral icterus.  ?NECK: Normal range of motion, supple, no masses noted on observation ?SKIN: No rash noted. Not diaphoretic. No erythema. No pallor. ?MUSCULOSKELETAL: Normal range of motion. No edema noted. ?NEUROLOGIC: Alert and oriented to person, place, and time. Normal muscle tone coordination. No cranial nerve deficit noted. ?PSYCHIATRIC: Normal mood and affect. Normal behavior. Normal judgment and thought content. ?CARDIOVASCULAR: Normal heart rate noted ?RESPIRATORY: Effort and breath sounds normal, no problems with respiration noted ?ABDOMEN: No masses noted. No other overt distention noted.   ?PELVIC: Deferred ?    ?Assessment and Plan:  ?   ?1. Menstrual periods irregular ?2. Infertility, female ?Patient likely has PCOS, will do evaluation for this consisting of labs and ultrasound. ?Recommended continued weight loss, optimization of health issues before attempting conception. Emphasized that  any pregnancy will be very high risk given her morbid obesity, HTN, cardiac issues, OSA. ?If she has PCOS, she is willing to try Metformin therapy again, CMET checked today. ?Will follow up results and manage accordingly.  Recommended semen analysis for her husband too. ?- Follicle stimulating hormone ?- Hemoglobin A1c ?-  Prolactin ?- TSH ?- Anti mullerian hormone ?- US PELVIC COMPLETE WITH TRANSVAGINAL; Future ?- B-HCG Quant ?- Testos,Total,Free and SHBG (Female) ? ?Routine preventative health maintenance measures emphasized. ?Please refer to After Visit Summary for other counseling recommendations.  ? ?Return for any gynecologic concerns.   ? ?I spent 30 minutes dedicated to the care of this patient including pre-visit review of records, face to face time with the patient discussing her conditions and treatments and post visit orders. ? ? ? ?Verita Schneiders, MD, FACOG ?Obstetrician Social research officer, government, Faculty Practice ?[facility] ? ? ? ? ? ? ?

## 2021-08-13 ENCOUNTER — Other Ambulatory Visit: Payer: Self-pay

## 2021-08-13 ENCOUNTER — Ambulatory Visit (INDEPENDENT_AMBULATORY_CARE_PROVIDER_SITE_OTHER): Payer: BC Managed Care – PPO

## 2021-08-13 DIAGNOSIS — N926 Irregular menstruation, unspecified: Secondary | ICD-10-CM

## 2021-08-13 DIAGNOSIS — N979 Female infertility, unspecified: Secondary | ICD-10-CM | POA: Diagnosis not present

## 2021-08-13 DIAGNOSIS — G4733 Obstructive sleep apnea (adult) (pediatric): Secondary | ICD-10-CM | POA: Diagnosis not present

## 2021-08-18 DIAGNOSIS — N979 Female infertility, unspecified: Secondary | ICD-10-CM | POA: Diagnosis not present

## 2021-08-18 DIAGNOSIS — N926 Irregular menstruation, unspecified: Secondary | ICD-10-CM | POA: Diagnosis not present

## 2021-08-19 ENCOUNTER — Encounter: Payer: Self-pay | Admitting: Obstetrics & Gynecology

## 2021-08-19 DIAGNOSIS — R7303 Prediabetes: Secondary | ICD-10-CM

## 2021-08-19 HISTORY — DX: Prediabetes: R73.03

## 2021-08-19 LAB — COMPREHENSIVE METABOLIC PANEL
AG Ratio: 1.2 (calc) (ref 1.0–2.5)
ALT: 19 U/L (ref 6–29)
AST: 12 U/L (ref 10–30)
Albumin: 4.1 g/dL (ref 3.6–5.1)
Alkaline phosphatase (APISO): 93 U/L (ref 31–125)
BUN: 17 mg/dL (ref 7–25)
CO2: 24 mmol/L (ref 20–32)
Calcium: 9.4 mg/dL (ref 8.6–10.2)
Chloride: 104 mmol/L (ref 98–110)
Creat: 0.78 mg/dL (ref 0.50–0.96)
Globulin: 3.4 g/dL (calc) (ref 1.9–3.7)
Glucose, Bld: 99 mg/dL (ref 65–139)
Potassium: 4 mmol/L (ref 3.5–5.3)
Sodium: 140 mmol/L (ref 135–146)
Total Bilirubin: 0.3 mg/dL (ref 0.2–1.2)
Total Protein: 7.5 g/dL (ref 6.1–8.1)

## 2021-08-21 LAB — HEMOGLOBIN A1C
Hgb A1c MFr Bld: 5.8 % of total Hgb — ABNORMAL HIGH (ref <5.7)
Mean Plasma Glucose: 120 mg/dL
eAG (mmol/L): 6.6 mmol/L

## 2021-08-21 LAB — HCG, QUANTITATIVE, PREGNANCY: HCG, Total, QN: <3 m[IU]/mL

## 2021-08-21 LAB — TSH: TSH: 0.8 mIU/L

## 2021-08-21 LAB — ANTI-MULLERIAN HORMONE (AMH), FEMALE: Anti-Mullerian Hormones(AMH), Female: 1.22 ng/mL (ref 1.02–14.63)

## 2021-08-21 LAB — FOLLICLE STIMULATING HORMONE: FSH: 7.3 m[IU]/mL

## 2021-08-21 LAB — PROLACTIN: Prolactin: 7.6 ng/mL

## 2021-08-22 LAB — TESTOS,TOTAL,FREE AND SHBG (FEMALE)
Free Testosterone: 3.3 pg/mL (ref 0.1–6.4)
Sex Hormone Binding: 19 nmol/L (ref 17–124)
Testosterone, Total, LC-MS-MS: 16 ng/dL (ref 2–45)

## 2021-08-23 ENCOUNTER — Other Ambulatory Visit: Payer: Self-pay | Admitting: Family Medicine

## 2021-08-23 DIAGNOSIS — Z760 Encounter for issue of repeat prescription: Secondary | ICD-10-CM

## 2021-08-25 ENCOUNTER — Encounter: Payer: Self-pay | Admitting: Cardiology

## 2021-08-26 ENCOUNTER — Telehealth: Payer: Self-pay

## 2021-08-26 NOTE — Telephone Encounter (Signed)
Called pt. No answer, left message for her to return the call.  ?

## 2021-08-26 NOTE — Telephone Encounter (Signed)
Called pt to see if she is still taking Cardizem and Propranolol. Dr. Servando Salina wants to d/c both of those and start Amlodipine 5 mg once daily and see her in 12 weeks after medication change. Pt did not pick up, left a message for her to return the call.  ?

## 2021-09-01 ENCOUNTER — Other Ambulatory Visit: Payer: Self-pay

## 2021-09-01 MED ORDER — HYDROCHLOROTHIAZIDE 12.5 MG PO CAPS: 12.5000 mg | ORAL_CAPSULE | Freq: Every day | ORAL | 3 refills | Status: DC

## 2021-09-01 NOTE — Progress Notes (Signed)
Prescription sent to pharmacy.

## 2021-09-13 DIAGNOSIS — G4733 Obstructive sleep apnea (adult) (pediatric): Secondary | ICD-10-CM | POA: Diagnosis not present

## 2021-09-14 ENCOUNTER — Encounter: Payer: Self-pay | Admitting: Cardiology

## 2021-09-19 ENCOUNTER — Encounter: Payer: Self-pay | Admitting: Obstetrics & Gynecology

## 2021-09-19 DIAGNOSIS — Z3041 Encounter for surveillance of contraceptive pills: Secondary | ICD-10-CM

## 2021-09-21 ENCOUNTER — Encounter: Payer: Self-pay | Admitting: Family Medicine

## 2021-09-21 DIAGNOSIS — R7301 Impaired fasting glucose: Secondary | ICD-10-CM

## 2021-09-21 MED ORDER — NORETHINDRONE 0.35 MG PO TABS: 1.0000 | ORAL_TABLET | Freq: Every day | ORAL | 11 refills | Status: DC

## 2021-09-22 ENCOUNTER — Encounter: Payer: Self-pay | Admitting: Obstetrics & Gynecology

## 2021-09-22 MED ORDER — NORETHINDRONE 0.35 MG PO TABS: 1.0000 | ORAL_TABLET | Freq: Every day | ORAL | 11 refills | Status: DC

## 2021-09-22 MED ORDER — METFORMIN HCL ER 500 MG PO TB24: 500.0000 mg | ORAL_TABLET | Freq: Every day | ORAL | 0 refills | Status: DC

## 2021-10-13 DIAGNOSIS — G4733 Obstructive sleep apnea (adult) (pediatric): Secondary | ICD-10-CM | POA: Diagnosis not present

## 2021-11-03 DIAGNOSIS — F319 Bipolar disorder, unspecified: Secondary | ICD-10-CM | POA: Diagnosis not present

## 2021-11-03 DIAGNOSIS — R7303 Prediabetes: Secondary | ICD-10-CM | POA: Diagnosis not present

## 2021-11-13 DIAGNOSIS — G4733 Obstructive sleep apnea (adult) (pediatric): Secondary | ICD-10-CM | POA: Diagnosis not present

## 2021-11-16 ENCOUNTER — Telehealth: Payer: Self-pay | Admitting: Neurology

## 2021-11-16 NOTE — Telephone Encounter (Signed)
LVM and mychart msg for need to reschedule 7/20 appt - MD out

## 2021-11-20 ENCOUNTER — Encounter: Payer: Self-pay | Admitting: Family Medicine

## 2021-11-25 DIAGNOSIS — Z713 Dietary counseling and surveillance: Secondary | ICD-10-CM | POA: Diagnosis not present

## 2021-11-25 DIAGNOSIS — R7303 Prediabetes: Secondary | ICD-10-CM | POA: Diagnosis not present

## 2021-11-25 DIAGNOSIS — Z13220 Encounter for screening for lipoid disorders: Secondary | ICD-10-CM | POA: Diagnosis not present

## 2021-12-08 ENCOUNTER — Other Ambulatory Visit: Payer: Self-pay | Admitting: Cardiology

## 2021-12-09 DIAGNOSIS — Z713 Dietary counseling and surveillance: Secondary | ICD-10-CM | POA: Diagnosis not present

## 2021-12-09 DIAGNOSIS — Z6841 Body Mass Index (BMI) 40.0 and over, adult: Secondary | ICD-10-CM | POA: Diagnosis not present

## 2021-12-09 DIAGNOSIS — R110 Nausea: Secondary | ICD-10-CM | POA: Diagnosis not present

## 2021-12-09 DIAGNOSIS — R7303 Prediabetes: Secondary | ICD-10-CM | POA: Diagnosis not present

## 2021-12-10 ENCOUNTER — Ambulatory Visit: Payer: BC Managed Care – PPO | Admitting: Neurology

## 2021-12-13 DIAGNOSIS — G4733 Obstructive sleep apnea (adult) (pediatric): Secondary | ICD-10-CM | POA: Diagnosis not present

## 2021-12-20 ENCOUNTER — Other Ambulatory Visit: Payer: Self-pay | Admitting: Family Medicine

## 2021-12-20 DIAGNOSIS — R7301 Impaired fasting glucose: Secondary | ICD-10-CM

## 2022-01-14 ENCOUNTER — Other Ambulatory Visit: Payer: Self-pay | Admitting: Family Medicine

## 2022-01-14 DIAGNOSIS — R7301 Impaired fasting glucose: Secondary | ICD-10-CM

## 2022-01-15 ENCOUNTER — Encounter: Payer: BC Managed Care – PPO | Admitting: Family Medicine

## 2022-01-19 DIAGNOSIS — Z713 Dietary counseling and surveillance: Secondary | ICD-10-CM | POA: Diagnosis not present

## 2022-01-19 DIAGNOSIS — R7303 Prediabetes: Secondary | ICD-10-CM | POA: Diagnosis not present

## 2022-01-19 DIAGNOSIS — Z6841 Body Mass Index (BMI) 40.0 and over, adult: Secondary | ICD-10-CM | POA: Diagnosis not present

## 2022-01-23 ENCOUNTER — Other Ambulatory Visit: Payer: Self-pay | Admitting: Family Medicine

## 2022-01-23 DIAGNOSIS — R7301 Impaired fasting glucose: Secondary | ICD-10-CM

## 2022-01-28 DIAGNOSIS — G4733 Obstructive sleep apnea (adult) (pediatric): Secondary | ICD-10-CM | POA: Diagnosis not present

## 2022-02-01 ENCOUNTER — Encounter: Payer: Self-pay | Admitting: Family Medicine

## 2022-02-03 ENCOUNTER — Telehealth: Payer: Self-pay

## 2022-02-03 ENCOUNTER — Telehealth: Payer: BC Managed Care – PPO | Admitting: Physician Assistant

## 2022-02-03 DIAGNOSIS — U071 COVID-19: Secondary | ICD-10-CM

## 2022-02-03 MED ORDER — MOLNUPIRAVIR EUA 200MG CAPSULE: 4.0000 | ORAL_CAPSULE | Freq: Two times a day (BID) | ORAL | 0 refills | Status: AC

## 2022-02-03 MED ORDER — BENZONATATE 100 MG PO CAPS: 100.0000 mg | ORAL_CAPSULE | Freq: Three times a day (TID) | ORAL | 0 refills | Status: DC | PRN

## 2022-02-03 NOTE — Progress Notes (Signed)
Virtual Visit Consent   Tonya Clark, you are scheduled for a virtual visit with a [facility] provider today. Just as with appointments in the office, your consent must be obtained to participate. Your consent will be active for this visit and any virtual visit you may have with one of our providers in the next 365 days. If you have a MyChart account, a copy of this consent can be sent to you electronically.  As this is a virtual visit, video technology does not allow for your provider to perform a traditional examination. This may limit your provider's ability to fully assess your condition. If your provider identifies any concerns that need to be evaluated in person or the need to arrange testing (such as labs, EKG, etc.), we will make arrangements to do so. Although advances in technology are sophisticated, we cannot ensure that it will always work on either your end or our end. If the connection with a video visit is poor, the visit may have to be switched to a telephone visit. With either a video or telephone visit, we are not always able to ensure that we have a secure connection.  By engaging in this virtual visit, you consent to the provision of healthcare and authorize for your insurance to be billed (if applicable) for the services provided during this visit. Depending on your insurance coverage, you may receive a charge related to this service.  I need to obtain your verbal consent now. Are you willing to proceed with your visit today? Sherre Wooton has provided verbal consent on 02/03/2022 for a virtual visit (video or telephone). Tonya Clark, [place]  Date: 02/03/2022 11:55 AM  Virtual Visit via Video Note   I, Tonya Clark, connected with  Ronnett Pullin  ([id number], 06/13/1995) on 02/03/22 at 11:45 AM EDT by a video-enabled telemedicine application and verified that I am speaking with the correct person using two identifiers.  Location: Patient: Virtual Visit Location  Patient: Home Provider: Virtual Visit Location Provider: Home Office   I discussed the limitations of evaluation and management by telemedicine and the availability of in person appointments. The patient expressed understanding and agreed to proceed.    History of Present Illness: Tonya Clark is a 26 y.o. who identifies as a female who was assigned female at birth, and is being seen today for COVID-19. Notes symptoms starting in past couple of days with head and nasal congestion, chest tightness and cough. Notes fatigue. Denies GI symptoms. Denies chest pain or overt SOB. Husband with COVID-19 currently. She tested positive as of this morning.  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   Inflammatory polyarthritis ([facility]) 09/29/2020   BMI 45.0-49.9, adult ([facility]) 11/13/2019   Migraine headache 01/04/2019   New daily persistent headache 11/25/2017   Moderate right ankle sprain 10/15/2016   PAC (premature atrial contraction) 10/09/2015   PVC (premature ventricular contraction) 10/09/2015   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  Bipolar 1 disorder ([facility]) 08/21/2013   Insomnia 08/21/2013    Allergies:  Allergies  Allergen Reactions   Lamictal [Lamotrigine] Rash   Trazodone And Nefazodone Other (See Comments)    Pt states, "sleep paralysis"   Cymbalta [Duloxetine Hcl] Other (See Comments)    Suicidal ideation   Definity [Perflutren Lipid Microsphere]  Nausea And Vomiting and Other (See Comments)    Nauseated, felt hot all over, with tingling in throat, tongue, hands and feet   Gabapentin Other (See Comments)    Hair Loss and weight gain   Medications:  Current Outpatient Medications:    benzonatate (TESSALON) 100 MG capsule, Take 1 capsule (100 mg total) by mouth 3 (three) times daily as needed for cough., Disp: 30 capsule, Rfl: 0   cyclobenzaprine (FLEXERIL) 10 MG tablet, TAKE 1 TABLET BY MOUTH EVERYDAY AT BEDTIME, Disp: 30 tablet, Rfl: 0   diltiazem (CARDIZEM CD) 240 MG 24 hr capsule, TAKE 1 CAPSULE BY MOUTH EVERY DAY, Disp: 90 capsule, Rfl: 3   eszopiclone (LUNESTA) 2 MG TABS tablet, Take 1 tablet by mouth at bedtime., Disp: , Rfl:    LYBALVI 10-10 MG TABS, Take 10 mg by mouth daily., Disp: , Rfl:    metFORMIN (GLUCOPHAGE-XR) 500 MG 24 hr tablet, TAKE 1 TABLET (500 MG TOTAL) BY MOUTH DAILY WITH BREAKFAST. NEEDS APPT, Disp: 7 tablet, Rfl: 0   montelukast (SINGULAIR) 10 MG tablet, TAKE 1 TABLET BY MOUTH EVERY DAY, Disp: 90 tablet, Rfl: 3   norethindrone (MICRONOR) 0.35 MG tablet, Take 1 tablet (0.35 mg total) by mouth daily., Disp: 28 tablet, Rfl: 11   omeprazole (PRILOSEC) 40 MG capsule, Take by mouth every morning., Disp: , Rfl:    oxcarbazepine (TRILEPTAL) 600 MG tablet, Take 600 mg by mouth 3 (three) times daily., Disp: , Rfl:    prazosin (MINIPRESS) 5 MG capsule, Take 5 mg by mouth at bedtime., Disp: , Rfl:    VYVANSE 40 MG capsule, Take 40 mg by mouth every morning., Disp: , Rfl:   Observations/Objective: Patient is well-developed, well-nourished in no acute distress.  Resting comfortably at home.  Head is normocephalic, atraumatic.  No labored breathing. Speech is clear and coherent with logical content.  Patient is alert and oriented at baseline.   Assessment and Plan: 1. COVID-19 - MyChart COVID-19 home monitoring program; Future - benzonatate (TESSALON) 100 MG capsule; Take 1 capsule (100 mg total) by mouth 3 (three)  times daily as needed for cough.  Dispense: 30 capsule; Refill: 0  Patient with multiple risk factors for complicated course of illness. Discussed risks/benefits of antiviral medications including most common potential ADRs. Patient voiced understanding and would like to proceed with antiviral medication. They are candidate for Molnupiravir. Rx sent to pharmacy. Supportive measures, OTC medications and vitamin regimen reviewed. Tessalon per orders. Patient has been enrolled in a MyChart COVID symptom monitoring program. Anne Shutter reviewed in detail. Strict ER precautions discussed with patient.   Follow Up Instructions: I discussed the assessment and treatment plan with the patient. The patient was provided an opportunity to ask questions and all were answered. The patient agreed with the plan and demonstrated an understanding of the instructions.  A copy of instructions were sent to the patient via MyChart unless otherwise noted below.   The patient was advised to call back or seek an in-person evaluation if the symptoms worsen or if the condition fails to improve as anticipated.  Time:  I spent 10 minutes with the patient via telehealth technology discussing  the above problems/concerns.    Tonya Climes, PA-C

## 2022-02-03 NOTE — Telephone Encounter (Signed)
Called, spoke with patient in regards to COVID 19 questionnaire on my chart. Advise patient from my chart companion on cough. Patient verb understanding.

## 2022-02-03 NOTE — Patient Instructions (Signed)
Tonya Clark, thank you for joining [name], PA-C for today's virtual visit.  While this provider is not your primary care provider (PCP), if your PCP is located in our provider database this encounter information will be shared with them immediately following your visit.  Consent: (Patient) Tonya Clark provided verbal consent for this virtual visit at the beginning of the encounter.  Current Medications:  Current Outpatient Medications:    cyclobenzaprine (FLEXERIL) 10 MG tablet, TAKE 1 TABLET BY MOUTH EVERYDAY AT BEDTIME, Disp: 30 tablet, Rfl: 0   diltiazem (CARDIZEM CD) 240 MG 24 hr capsule, TAKE 1 CAPSULE BY MOUTH EVERY DAY, Disp: 90 capsule, Rfl: 3   eszopiclone (LUNESTA) 2 MG TABS tablet, Take 1 tablet by mouth at bedtime., Disp: , Rfl:    hydrochlorothiazide (MICROZIDE) 12.5 MG capsule, Take 1 capsule (12.5 mg total) by mouth daily., Disp: 90 capsule, Rfl: 3   LYBALVI 10-10 MG TABS, Take 10 mg by mouth daily., Disp: , Rfl:    metFORMIN (GLUCOPHAGE-XR) 500 MG 24 hr tablet, TAKE 1 TABLET (500 MG TOTAL) BY MOUTH DAILY WITH BREAKFAST. NEEDS APPT, Disp: 7 tablet, Rfl: 0   montelukast (SINGULAIR) 10 MG tablet, TAKE 1 TABLET BY MOUTH EVERY DAY, Disp: 90 tablet, Rfl: 3   norethindrone (MICRONOR) 0.35 MG tablet, Take 1 tablet (0.35 mg total) by mouth daily., Disp: 28 tablet, Rfl: 11   omeprazole (PRILOSEC) 40 MG capsule, Take by mouth every morning., Disp: , Rfl:    ondansetron (ZOFRAN) 4 MG tablet, Take 1 tablet (4 mg total) by mouth every 8 (eight) hours as needed for nausea or vomiting., Disp: 20 tablet, Rfl: 0   oxcarbazepine (TRILEPTAL) 600 MG tablet, Take 600 mg by mouth 3 (three) times daily., Disp: , Rfl:    prazosin (MINIPRESS) 5 MG capsule, Take 5 mg by mouth at bedtime., Disp: , Rfl:    VYVANSE 40 MG capsule, Take 40 mg by mouth every morning., Disp: , Rfl:    Medications ordered in this encounter:  No orders of the defined types were placed in this encounter.     *If you need refills on other medications prior to your next appointment, please contact your pharmacy*  Follow-Up: Call back or seek an in-person evaluation if the symptoms worsen or if the condition fails to improve as anticipated.  Other Instructions Please keep well-hydrated and get plenty of rest. Start a saline nasal rinse to flush out your nasal passages. You can use plain Mucinex to help thin congestion. If you have a humidifier, running in the bedroom at night. I want you to start OTC vitamin D3 1000 units daily, vitamin C 1000 mg daily, and a zinc supplement. Please take prescribed medications as directed.  You have been enrolled in a MyChart symptom monitoring program. Please answer these questions daily so we can keep track of how you are doing.  You were to quarantine for 5 days from onset of your symptoms.  After day 5, if you have had no fever and you are feeling better, you can end quarantine but need to mask for an additional 5 days. After day 5 if you have a fever or are having significant symptoms, please quarantine for full 10 days.  If you note any worsening of symptoms, any significant shortness of breath or any chest pain, please seek ER evaluation ASAP.  Please do not delay care!  COVID-19: What to Do if You Are Sick If you test positive and are an older adult or  someone who is at high risk of getting very sick from COVID-19, treatment may be available. Contact a healthcare provider right away after a positive test to determine if you are eligible, even if your symptoms are mild right now. You can also visit a Test to Treat location and, if eligible, receive a prescription from a provider. Don't delay: Treatment must be started within the first few days to be effective. If you have a fever, cough, or other symptoms, you might have COVID-19. Most people have mild illness and are able to recover at home. If you are sick: Keep track of your symptoms. If you have an  emergency warning sign (including trouble breathing), call 911. Steps to help prevent the spread of COVID-19 if you are sick If you are sick with COVID-19 or think you might have COVID-19, follow the steps below to care for yourself and to help protect other people in your home and community. Stay home except to get medical care Stay home. Most people with COVID-19 have mild illness and can recover at home without medical care. Do not leave your home, except to get medical care. Do not visit public areas and do not go to places where you are unable to wear a mask. Take care of yourself. Get rest and stay hydrated. Take over-the-counter medicines, such as acetaminophen, to help you feel better. Stay in touch with your doctor. Call before you get medical care. Be sure to get care if you have trouble breathing, or have any other emergency warning signs, or if you think it is an emergency. Avoid public transportation, ride-sharing, or taxis if possible. Get tested If you have symptoms of COVID-19, get tested. While waiting for test results, stay away from others, including staying apart from those living in your household. Get tested as soon as possible after your symptoms start. Treatments may be available for people with COVID-19 who are at risk for becoming very sick. Don't delay: Treatment must be started early to be effective--some treatments must begin within 5 days of your first symptoms. Contact your healthcare provider right away if your test result is positive to determine if you are eligible. Self-tests are one of several options for testing for the virus that causes COVID-19 and may be more convenient than laboratory-based tests and point-of-care tests. Ask your healthcare provider or your local health department if you need help interpreting your test results. You can visit your state, tribal, local, and territorial health department's website to look for the latest local information on testing  sites. Separate yourself from other people As much as possible, stay in a specific room and away from other people and pets in your home. If possible, you should use a separate bathroom. If you need to be around other people or animals in or outside of the home, wear a well-fitting mask. Tell your close contacts that they may have been exposed to COVID-19. An infected person can spread COVID-19 starting 48 hours (or 2 days) before the person has any symptoms or tests positive. By letting your close contacts know they may have been exposed to COVID-19, you are helping to protect everyone. See COVID-19 and Animals if you have questions about pets. If you are diagnosed with COVID-19, someone from the health department may call you. Answer the call to slow the spread. Monitor your symptoms Symptoms of COVID-19 include fever, cough, or other symptoms. Follow care instructions from your healthcare provider and local health department. Your local health authorities may  give instructions on checking your symptoms and reporting information. When to seek emergency medical attention Look for emergency warning signs* for COVID-19. If someone is showing any of these signs, seek emergency medical care immediately: Trouble breathing Persistent pain or pressure in the chest New confusion Inability to wake or stay awake Pale, gray, or blue-colored skin, lips, or nail beds, depending on skin tone *This list is not all possible symptoms. Please call your medical provider for any other symptoms that are severe or concerning to you. Call 911 or call ahead to your local emergency facility: Notify the operator that you are seeking care for someone who has or may have COVID-19. Call ahead before visiting your doctor Call ahead. Many medical visits for routine care are being postponed or done by phone or telemedicine. If you have a medical appointment that cannot be postponed, call your doctor's office, and tell them you  have or may have COVID-19. This will help the office protect themselves and other patients. If you are sick, wear a well-fitting mask You should wear a mask if you must be around other people or animals, including pets (even at home). Wear a mask with the best fit, protection, and comfort for you. You don't need to wear the mask if you are alone. If you can't put on a mask (because of trouble breathing, for example), cover your coughs and sneezes in some other way. Try to stay at least 6 feet away from other people. This will help protect the people around you. Masks should not be placed on young children under age 79 years, anyone who has trouble breathing, or anyone who is not able to remove the mask without help. Cover your coughs and sneezes Cover your mouth and nose with a tissue when you cough or sneeze. Throw away used tissues in a lined trash can. Immediately wash your hands with soap and water for at least 20 seconds. If soap and water are not available, clean your hands with an alcohol-based hand sanitizer that contains at least 60% alcohol. Clean your hands often Wash your hands often with soap and water for at least 20 seconds. This is especially important after blowing your nose, coughing, or sneezing; going to the bathroom; and before eating or preparing food. Use hand sanitizer if soap and water are not available. Use an alcohol-based hand sanitizer with at least 60% alcohol, covering all surfaces of your hands and rubbing them together until they feel dry. Soap and water are the best option, especially if hands are visibly dirty. Avoid touching your eyes, nose, and mouth with unwashed hands. Handwashing Tips Avoid sharing personal household items Do not share dishes, drinking glasses, cups, eating utensils, towels, or bedding with other people in your home. Wash these items thoroughly after using them with soap and water or put in the dishwasher. Clean surfaces in your home  regularly Clean and disinfect high-touch surfaces (for example, doorknobs, tables, handles, light switches, and countertops) in your "sick room" and bathroom. In shared spaces, you should clean and disinfect surfaces and items after each use by the person who is ill. If you are sick and cannot clean, a caregiver or other person should only clean and disinfect the area around you (such as your bedroom and bathroom) on an as needed basis. Your caregiver/other person should wait as long as possible (at least several hours) and wear a mask before entering, cleaning, and disinfecting shared spaces that you use. Clean and disinfect areas that may  have blood, stool, or body fluids on them. Use household cleaners and disinfectants. Clean visible dirty surfaces with household cleaners containing soap or detergent. Then, use a household disinfectant. Use a product from [employer] List N: Disinfectants for Coronavirus (COVID-19). Be sure to follow the instructions on the label to ensure safe and effective use of the product. Many products recommend keeping the surface wet with a disinfectant for a certain period of time (look at "contact time" on the product label). You may also need to wear personal protective equipment, such as gloves, depending on the directions on the product label. Immediately after disinfecting, wash your hands with soap and water for 20 seconds. For completed guidance on cleaning and disinfecting your home, visit Complete Disinfection Guidance. Take steps to improve ventilation at home Improve ventilation (air flow) at home to help prevent from spreading COVID-19 to other people in your household. Clear out COVID-19 virus particles in the air by opening windows, using air filters, and turning on fans in your home. Use this interactive tool to learn how to improve air flow in your home. When you can be around others after being sick with COVID-19 Deciding when you can be around others is  different for different situations. Find out when you can safely end home isolation. For any additional questions about your care, contact your healthcare provider or state or local health department. 08/12/2020 Content source: [facility] for Immunization and Respiratory Diseases ([facility]), Division of Viral Diseases This information is not intended to replace advice given to you by your health care provider. Make sure you discuss any questions you have with your health care provider. Document Revised: 09/25/2020 Document Reviewed: 09/25/2020 Elsevier Patient Education  2022 [employer].      If you have been instructed to have an in-person evaluation today at a local Urgent Care facility, please use the link below. It will take you to a list of all of our available [facility], including address, phone number and hours of operation. Please do not delay care.  [facility]  If you or a family member do not have a primary care provider, use the link below to schedule a visit and establish care. When you choose a [facility] primary care physician or advanced practice provider, you gain a long-term partner in health. Find a Primary Care Provider  Learn more about [facility]'s in-office and virtual care options:  - Get Care Now

## 2022-02-03 NOTE — Addendum Note (Signed)
Addended by: Waldon Merl on: 02/03/2022 01:03 PM   Modules accepted: Orders

## 2022-02-04 NOTE — Telephone Encounter (Signed)
Patient was scheduled and seen for requested Paxlovid yesterday. Tonya Clark

## 2022-02-17 DIAGNOSIS — I10 Essential (primary) hypertension: Secondary | ICD-10-CM | POA: Diagnosis not present

## 2022-02-17 DIAGNOSIS — Z713 Dietary counseling and surveillance: Secondary | ICD-10-CM | POA: Diagnosis not present

## 2022-02-17 DIAGNOSIS — Z6841 Body Mass Index (BMI) 40.0 and over, adult: Secondary | ICD-10-CM | POA: Diagnosis not present

## 2022-02-27 DIAGNOSIS — G4733 Obstructive sleep apnea (adult) (pediatric): Secondary | ICD-10-CM | POA: Diagnosis not present

## 2022-03-04 ENCOUNTER — Telehealth: Payer: Self-pay | Admitting: Family Medicine

## 2022-03-04 ENCOUNTER — Other Ambulatory Visit: Payer: Self-pay | Admitting: Family Medicine

## 2022-03-04 DIAGNOSIS — R7301 Impaired fasting glucose: Secondary | ICD-10-CM

## 2022-03-04 MED ORDER — METFORMIN HCL ER 500 MG PO TB24: 500.0000 mg | ORAL_TABLET | Freq: Every day | ORAL | 0 refills | Status: DC

## 2022-03-04 NOTE — Telephone Encounter (Signed)
Left message advising metformin has been sent to the pharmacy.

## 2022-03-04 NOTE — Telephone Encounter (Signed)
Good Morning, Patient is requesting refills on medication and is scheduled for 04-05-2022.

## 2022-03-20 ENCOUNTER — Telehealth: Payer: BC Managed Care – PPO | Admitting: Family

## 2022-03-20 DIAGNOSIS — J019 Acute sinusitis, unspecified: Secondary | ICD-10-CM | POA: Diagnosis not present

## 2022-03-20 MED ORDER — AMOXICILLIN-POT CLAVULANATE 875-125 MG PO TABS: 1.0000 | ORAL_TABLET | Freq: Two times a day (BID) | ORAL | 0 refills | Status: DC

## 2022-03-20 NOTE — Progress Notes (Signed)
Virtual Visit Consent   Tonya Clark, you are scheduled for a virtual visit with a [facility] provider today. Just as with appointments in the office, your consent must be obtained to participate. Your consent will be active for this visit and any virtual visit you may have with one of our providers in the next 365 days. If you have a MyChart account, a copy of this consent can be sent to you electronically.  As this is a virtual visit, video technology does not allow for your provider to perform a traditional examination. This may limit your provider's ability to fully assess your condition. If your provider identifies any concerns that need to be evaluated in person or the need to arrange testing (such as labs, EKG, etc.), we will make arrangements to do so. Although advances in technology are sophisticated, we cannot ensure that it will always work on either your end or our end. If the connection with a video visit is poor, the visit may have to be switched to a telephone visit. With either a video or telephone visit, we are not always able to ensure that we have a secure connection.  By engaging in this virtual visit, you consent to the provision of healthcare and authorize for your insurance to be billed (if applicable) for the services provided during this visit. Depending on your insurance coverage, you may receive a charge related to this service.  I need to obtain your verbal consent now. Are you willing to proceed with your visit today? Tonya Clark has provided verbal consent on 03/20/2022 for a virtual visit (video or telephone). Tonya Dun, FNP  Date: 03/20/2022 3:17 PM  Virtual Visit via Video Note   I, Tonya Clark, connected with  Tonya Clark  ([id number], 25-Sep-1995) on 03/20/22 at  3:15 PM EDT by a video-enabled telemedicine application and verified that I am speaking with the correct person using two identifiers.  Location: Patient: Virtual Visit Location Patient:  Home Provider: Virtual Visit Location Provider: Home Office   I discussed the limitations of evaluation and management by telemedicine and the availability of in person appointments. The patient expressed understanding and agreed to proceed.    History of Present Illness: Tonya Clark is a 26 y.o. who identifies as a female who was assigned female at birth, and is being seen today for sinus congestion and sinus pain that started over a week ago.  HPI: Sinusitis This is a new problem. The current episode started in the past 7 days. The problem has been gradually worsening since onset. There has been no fever. Her pain is at a severity of 1/10. The pain is moderate. Associated symptoms include congestion, coughing, ear pain, headaches and sinus pressure. Pertinent negatives include no shortness of breath, sneezing or sore throat. Past treatments include oral decongestants. The treatment provided mild relief.    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  BMI 45.0-49.9, adult (HCC) 11/13/2019   Migraine headache 01/04/2019   New daily persistent headache 11/25/2017   Moderate right ankle sprain 10/15/2016   PAC (premature atrial contraction) 10/09/2015   PVC (premature ventricular contraction) 10/09/2015   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   Bipolar 1 disorder (HCC) 08/21/2013   Insomnia 08/21/2013    Allergies:   Allergies  Allergen Reactions   Lamictal [Lamotrigine] Rash   Trazodone And Nefazodone Other (See Comments)    Pt states, "sleep paralysis"   Cymbalta [Duloxetine Hcl] Other (See Comments)    Suicidal ideation   Definity [Perflutren Lipid Microsphere] Nausea And Vomiting and Other (See Comments)    Nauseated, felt hot all over, with tingling in throat, tongue, hands and feet   Gabapentin Other (See Comments)    Hair Loss and weight gain   Medications:  Current Outpatient Medications:    amoxicillin-clavulanate (AUGMENTIN) 875-125 MG tablet, Take 1 tablet by mouth 2 (two) times daily., Disp: 14 tablet, Rfl: 0   diltiazem (CARDIZEM CD) 240 MG 24 hr capsule, TAKE 1 CAPSULE BY MOUTH EVERY DAY, Disp: 90 capsule, Rfl: 3   eszopiclone (LUNESTA) 2 MG TABS tablet, Take 1 tablet by mouth at bedtime., Disp: , Rfl:    LYBALVI 10-10 MG TABS, Take 10 mg by mouth daily., Disp: , Rfl:    metFORMIN (GLUCOPHAGE-XR) 500 MG 24 hr tablet, Take 1 tablet (500 mg total) by mouth daily with breakfast. Needs appt, Disp: 30 tablet, Rfl: 0   montelukast (SINGULAIR) 10 MG tablet, TAKE 1 TABLET BY MOUTH EVERY DAY, Disp: 90 tablet, Rfl: 3   norethindrone (MICRONOR) 0.35 MG tablet, Take 1 tablet (0.35 mg total) by mouth daily., Disp: 28 tablet, Rfl: 11   omeprazole (PRILOSEC) 40 MG capsule, Take by mouth every morning., Disp: , Rfl:    oxcarbazepine (TRILEPTAL) 600 MG tablet, Take 600 mg by mouth 3 (three) times daily., Disp: , Rfl:    prazosin (MINIPRESS) 5 MG capsule, Take 5 mg by mouth at bedtime., Disp: , Rfl:    VYVANSE 40 MG capsule, Take 40 mg by mouth every morning., Disp: , Rfl:   Observations/Objective: Patient is well-developed, well-nourished in no acute distress.  Resting comfortably  at home.  Head is normocephalic, atraumatic.  No labored breathing.  Speech is clear and coherent with logical content.  Patient is alert and oriented at baseline.    Assessment and Plan: 1. Acute sinusitis,  recurrence not specified, unspecified location - amoxicillin-clavulanate (AUGMENTIN) 875-125 MG tablet; Take 1 tablet by mouth 2 (two) times daily.  Dispense: 14 tablet; Refill: 0  - Take meds as prescribed - Use a cool mist humidifier  -Use saline nose sprays frequently -Force fluids -For any cough or congestion  Use plain Mucinex- regular strength or max strength is fine -For fever or aces or pains- take tylenol or ibuprofen. -Throat lozenges if help -Follow up if symptoms worsen or do not improve  Follow Up Instructions: I discussed the assessment and treatment plan with the patient. The patient was provided an opportunity to ask questions and all were answered. The patient agreed with the plan and demonstrated an understanding of the instructions.  A copy of instructions were sent to the patient via MyChart unless otherwise noted below.     The patient was advised to call back or seek an in-person evaluation if the symptoms worsen or if the condition fails to improve as anticipated.  Time:  I spent 11 minutes with the patient via telehealth technology discussing  the above problems/concerns.    Jannifer Rodney, FNP

## 2022-03-23 DIAGNOSIS — E6609 Other obesity due to excess calories: Secondary | ICD-10-CM | POA: Diagnosis not present

## 2022-03-23 DIAGNOSIS — Z713 Dietary counseling and surveillance: Secondary | ICD-10-CM | POA: Diagnosis not present

## 2022-03-23 DIAGNOSIS — R7303 Prediabetes: Secondary | ICD-10-CM | POA: Diagnosis not present

## 2022-03-23 DIAGNOSIS — Z6841 Body Mass Index (BMI) 40.0 and over, adult: Secondary | ICD-10-CM | POA: Diagnosis not present

## 2022-03-24 ENCOUNTER — Other Ambulatory Visit: Payer: Self-pay | Admitting: Family Medicine

## 2022-03-24 DIAGNOSIS — R7301 Impaired fasting glucose: Secondary | ICD-10-CM

## 2022-03-24 DIAGNOSIS — F319 Bipolar disorder, unspecified: Secondary | ICD-10-CM | POA: Diagnosis not present

## 2022-03-30 DIAGNOSIS — G4733 Obstructive sleep apnea (adult) (pediatric): Secondary | ICD-10-CM | POA: Diagnosis not present

## 2022-04-05 ENCOUNTER — Ambulatory Visit: Payer: BC Managed Care – PPO | Admitting: Family Medicine

## 2022-04-05 VITALS — BP 123/83 | HR 63 | Ht 66.0 in | Wt 289.0 lb

## 2022-04-05 DIAGNOSIS — Z3009 Encounter for other general counseling and advice on contraception: Secondary | ICD-10-CM

## 2022-04-05 DIAGNOSIS — Z23 Encounter for immunization: Secondary | ICD-10-CM | POA: Diagnosis not present

## 2022-04-05 DIAGNOSIS — R7301 Impaired fasting glucose: Secondary | ICD-10-CM | POA: Diagnosis not present

## 2022-04-05 DIAGNOSIS — G43009 Migraine without aura, not intractable, without status migrainosus: Secondary | ICD-10-CM | POA: Diagnosis not present

## 2022-04-05 DIAGNOSIS — F513 Sleepwalking [somnambulism]: Secondary | ICD-10-CM

## 2022-04-05 DIAGNOSIS — F319 Bipolar disorder, unspecified: Secondary | ICD-10-CM

## 2022-04-05 LAB — POCT GLYCOSYLATED HEMOGLOBIN (HGB A1C): HbA1c POC (<> result, manual entry): 5.3 % (ref 4.0–5.6)

## 2022-04-05 MED ORDER — JUNEL FE 1.5/30 1.5-30 MG-MCG PO TABS: 1.0000 | ORAL_TABLET | Freq: Every day | ORAL | 4 refills | Status: DC

## 2022-04-05 MED ORDER — JUNEL FE 1.5/30 1.5-30 MG-MCG PO TABS: 1.0000 | ORAL_TABLET | Freq: Every day | ORAL | 3 refills | Status: DC

## 2022-04-05 MED ORDER — METFORMIN HCL ER 500 MG PO TB24: 500.0000 mg | ORAL_TABLET | Freq: Every day | ORAL | 3 refills | Status: DC

## 2022-04-05 NOTE — Progress Notes (Unsigned)
Established Patient Office Visit  Subjective   Patient ID: Tonya Clark, female    DOB: 07/08/1995  Age: 26 y.o. MRN: [id number]  Chief Complaint  Patient presents with   Follow-up    HPI  Impaired fasting glucose-no increased thirst or urination. No symptoms consistent with hypoglycemia.  SHe is also going [facility] and they have started her on Wegovy.  Patient started about 2 weeks ago she has been doing well.  She really like to get back on her old birth control which was Junel.  It was discontinued and switched to just a progesterone only after her blood pressures were quite elevated.  Her blood pressures have been really well controlled lately so she really like to retry her old pill again.  She is having 2 periods a month on her current regimen.  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  She has been happy where she is at.  Now teaching at [employer].  She is no longer using the Lunesta.  Has struggled with decreased thirst and not being able to drink water since she had COVID.  She says most like an aversion if she tries to drink it.  Feels like the dehydration have actually been triggering some headaches she has had 2 migraines in the last month.  {History (Optional):23778}  ROS    Objective:     BP 123/83   Pulse 63   Ht 5\' 6"  (1.676 m)   Wt 289 lb (131.1 kg)   SpO2 99%   BMI 46.65 kg/m  {Vitals History (Optional):23777}  Physical Exam Vitals reviewed.  Constitutional:      Appearance: She is well-developed.  HENT:     Head: Normocephalic and atraumatic.  Eyes:     Conjunctiva/sclera: Conjunctivae normal.  Cardiovascular:     Rate and Rhythm: Normal rate.  Pulmonary:     Effort: Pulmonary effort is normal.  Skin:    General: Skin is dry.     Coloration: Skin is not pale.  Neurological:     Mental Status: She is alert and oriented to person, place,  and time.  Psychiatric:        Behavior: Behavior normal.      No results found for any visits on 04/05/22.  {Labs (Optional):23779}  The ASCVD Risk score (Arnett DK, et al., 2019) failed to calculate for the following reasons:   The 2019 ASCVD risk score is only valid for ages 85 to 54    Assessment & Plan:   Problem List Items Addressed This Visit       Cardiovascular and Mediastinum   Migraine headache    Not wanting to go back on prophylactic treatment yet.  We discussed some strategies around increasing her water and hydration intake for now.  Hopefully this will improve and reduce the frequency of migraines.        Endocrine   IFG (impaired fasting glucose) - Primary   Relevant Medications   metFORMIN (GLUCOPHAGE-XR) 500 MG 24 hr tablet   Other Relevant Orders   POCT HgB A1C     Nervous and Auditory   Sleep walking    Prazosin at night.        Other   Bipolar 1 disorder (HCC)    Is with psychiatry.  She is actually been on the current regimen for at least 6 months and has been  pretty stable with it.  PHQ-9 score of 5 and GAD-7 score of 3.      Other Visit Diagnoses     Counseling for birth control, oral contraceptives       Relevant Medications   JUNEL FE 1.5/30 1.5-30 MG-MCG tablet       We did discuss switching back to the old medication.  She goes to [facility] regularly and they monitor her blood pressure if at any point her blood pressure is increasing again that it may truly be a side effect of the estrogen component of her birth control.  If that is the case we will have to come up with an alternative.  But for now we will give it a try again.  Return in about 6 months (around 10/04/2022) for Prediabetes.    Nani Gasser, MD

## 2022-04-05 NOTE — Assessment & Plan Note (Signed)
Not wanting to go back on prophylactic treatment yet.  We discussed some strategies around increasing her water and hydration intake for now.  Hopefully this will improve and reduce the frequency of migraines.

## 2022-04-05 NOTE — Assessment & Plan Note (Signed)
Prazosin at night.

## 2022-04-05 NOTE — Assessment & Plan Note (Signed)
Is with psychiatry.  She is actually been on the current regimen for at least 6 months and has been pretty stable with it.  PHQ-9 score of 5 and GAD-7 score of 3.

## 2022-04-06 NOTE — Assessment & Plan Note (Signed)
1C looks awesome today!  Great work.  Currently on a GLP-1.  Continue to follow every 6 months.

## 2022-04-11 DIAGNOSIS — F4310 Post-traumatic stress disorder, unspecified: Secondary | ICD-10-CM | POA: Diagnosis not present

## 2022-04-25 DIAGNOSIS — F4310 Post-traumatic stress disorder, unspecified: Secondary | ICD-10-CM | POA: Diagnosis not present

## 2022-04-26 ENCOUNTER — Encounter: Payer: Self-pay | Admitting: Family Medicine

## 2022-04-26 DIAGNOSIS — E6609 Other obesity due to excess calories: Secondary | ICD-10-CM | POA: Diagnosis not present

## 2022-04-26 DIAGNOSIS — R7303 Prediabetes: Secondary | ICD-10-CM | POA: Diagnosis not present

## 2022-04-26 DIAGNOSIS — Z713 Dietary counseling and surveillance: Secondary | ICD-10-CM | POA: Diagnosis not present

## 2022-04-26 DIAGNOSIS — Z6841 Body Mass Index (BMI) 40.0 and over, adult: Secondary | ICD-10-CM | POA: Diagnosis not present

## 2022-04-27 DIAGNOSIS — F319 Bipolar disorder, unspecified: Secondary | ICD-10-CM | POA: Diagnosis not present

## 2022-05-02 DIAGNOSIS — F4310 Post-traumatic stress disorder, unspecified: Secondary | ICD-10-CM | POA: Diagnosis not present

## 2022-05-04 DIAGNOSIS — G4733 Obstructive sleep apnea (adult) (pediatric): Secondary | ICD-10-CM | POA: Diagnosis not present

## 2022-05-07 ENCOUNTER — Encounter: Payer: Self-pay | Admitting: Family Medicine

## 2022-05-09 DIAGNOSIS — F4310 Post-traumatic stress disorder, unspecified: Secondary | ICD-10-CM | POA: Diagnosis not present

## 2022-05-10 MED ORDER — MEDROXYPROGESTERONE ACETATE 10 MG PO TABS: 10.0000 mg | ORAL_TABLET | Freq: Every day | ORAL | 0 refills | Status: DC

## 2022-05-15 DIAGNOSIS — F4310 Post-traumatic stress disorder, unspecified: Secondary | ICD-10-CM | POA: Diagnosis not present

## 2022-05-23 DIAGNOSIS — F4310 Post-traumatic stress disorder, unspecified: Secondary | ICD-10-CM | POA: Diagnosis not present

## 2022-06-02 ENCOUNTER — Other Ambulatory Visit: Payer: Self-pay | Admitting: Family Medicine

## 2022-06-02 DIAGNOSIS — Z760 Encounter for issue of repeat prescription: Secondary | ICD-10-CM

## 2022-06-17 ENCOUNTER — Encounter: Payer: Self-pay | Admitting: Family Medicine

## 2022-07-13 ENCOUNTER — Encounter: Payer: Self-pay | Admitting: Family Medicine

## 2022-07-16 ENCOUNTER — Telehealth: Payer: Self-pay | Admitting: Family Medicine

## 2022-07-16 NOTE — Telephone Encounter (Signed)
Pt sent a mychart message stating "My eyesight has been going completely pitch black and my eyes shake while it happens. It's becoming more frequent (happened twice just yesterday). ER doctor said to see [facility] about it". Pt is scheduled 07/27/22, in Methe[facility]'s next available appointment.

## 2022-07-16 NOTE — Telephone Encounter (Signed)
Pt sent a mychart stating "My eyesight has been going completely pitch black and my eyes shake while it happens. It's becoming more frequent (happened twice just yesterday). ER doctor said to see [facility] about it" . Pt is scheduled 07/27/22, which is Methe[facility]'s next available appointment.

## 2022-07-19 ENCOUNTER — Ambulatory Visit: Payer: BC Managed Care – PPO | Admitting: Family Medicine

## 2022-07-19 ENCOUNTER — Encounter: Payer: Self-pay | Admitting: Family Medicine

## 2022-07-19 VITALS — BP 124/82 | HR 92 | Temp 98.5°F | Ht 66.0 in | Wt 286.9 lb

## 2022-07-19 DIAGNOSIS — H539 Unspecified visual disturbance: Secondary | ICD-10-CM | POA: Diagnosis not present

## 2022-07-19 NOTE — Progress Notes (Signed)
Acute Office Visit  Subjective:     Patient ID: Tonya Clark, female    DOB: 09/24/1995, 27 y.o.   MRN: [id number]  Chief Complaint  Patient presents with   Eye Problem    8 months to 1 year. Vision goes completely black and shakes back and forth for at least 5 seconds. It has started to happen very frequently at least 5 times in the last week. Saw her Optometrist on Friday, who states that it may be a Neurological.    Eye Problem  Pertinent negatives include no tingling or weakness.   Patient is in today for acute visit. Pt is new to me.  She reports 8 months of vision disturbances. She reports she has sporadic episodes where her vision is pitch black, both eyes. She says as soon as it happens, it feels like her eyes are going back and forth fast. She denies any other symptoms when this happens.  She went to [place] [facility] on Friday. She had photos done and was told nothing was wrong with her eyes. Pt reports she's been taking all of her medicines and the only one that's new is her Adderall. Pt use to be on Metformin but since her A1c was normal in Nov 2023. She stopped all diabetic medicines.   Review of Systems  Eyes:  Negative for pain.       Blacked out vision  Neurological:  Negative for dizziness, tingling, tremors, sensory change, speech change, focal weakness, seizures, loss of consciousness, weakness and headaches.  All other systems reviewed and are negative.       Objective:    BP 124/82   Pulse 92   Temp 98.5 F (36.9 C) (Oral)   Ht '5\' 6"'$  (1.676 m)   Wt 286 lb 14.4 oz (130.1 kg)   SpO2 97%   BMI 46.31 kg/m    Physical Exam Vitals and nursing note reviewed.  Constitutional:      Appearance: Normal appearance. She is obese.  HENT:     Head: Normocephalic and atraumatic.     Right Ear: External ear normal.     Left Ear: External ear normal.     Nose: Nose normal.     Mouth/Throat:     Mouth: Mucous membranes are moist.  Skin:    General: Skin is  warm.     Capillary Refill: Capillary refill takes less than 2 seconds.  Neurological:     General: No focal deficit present.     Mental Status: She is alert and oriented to person, place, and time. Mental status is at baseline.  Psychiatric:        Mood and Affect: Mood normal.        Behavior: Behavior normal.        Thought Content: Thought content normal.        Judgment: Judgment normal.    No results found for any visits on 07/19/22.      Assessment & Plan:   Problem List Items Addressed This Visit   None Visit Diagnoses     Vision abnormalities    -  Primary   Relevant Orders   Sedimentation rate   C-reactive protein   ANA   Hemoglobin A1c   Prolactin       No orders of the defined types were placed in this encounter. Pt reports a normal exam by her optometrist on Friday. No report for review. Will start with baseline screening labs to include A1c and inflammatory  markers. Advised pt to follow up with PCP for further evaluation of symptoms since it's persisted for the last 8 months.   No follow-ups on file.  Leeanne Rio, MD

## 2022-07-21 ENCOUNTER — Encounter: Payer: Self-pay | Admitting: Family Medicine

## 2022-07-21 ENCOUNTER — Ambulatory Visit: Payer: BC Managed Care – PPO | Admitting: Family Medicine

## 2022-07-21 VITALS — BP 143/70 | HR 95 | Ht 66.0 in | Wt 285.5 lb

## 2022-07-21 DIAGNOSIS — H539 Unspecified visual disturbance: Secondary | ICD-10-CM

## 2022-07-21 DIAGNOSIS — E611 Iron deficiency: Secondary | ICD-10-CM | POA: Diagnosis not present

## 2022-07-21 DIAGNOSIS — R700 Elevated erythrocyte sedimentation rate: Secondary | ICD-10-CM | POA: Diagnosis not present

## 2022-07-21 DIAGNOSIS — R7301 Impaired fasting glucose: Secondary | ICD-10-CM

## 2022-07-21 LAB — HEMOGLOBIN A1C
Est. average glucose Bld gHb Est-mCnc: 117 mg/dL
Hgb A1c MFr Bld: 5.7 % — ABNORMAL HIGH (ref 4.8–5.6)

## 2022-07-21 LAB — PROLACTIN: Prolactin: 12.7 ng/mL (ref 4.8–33.4)

## 2022-07-21 LAB — ANA: ANA Titer 1: NEGATIVE

## 2022-07-21 LAB — SEDIMENTATION RATE: Sed Rate: 103 mm/hr — ABNORMAL HIGH (ref 0–32)

## 2022-07-21 LAB — C-REACTIVE PROTEIN: CRP: 45 mg/L — ABNORMAL HIGH (ref 0–10)

## 2022-07-21 MED ORDER — METFORMIN HCL ER 500 MG PO TB24: 500.0000 mg | ORAL_TABLET | Freq: Two times a day (BID) | ORAL | 1 refills | Status: DC

## 2022-07-21 NOTE — Assessment & Plan Note (Signed)
Unfortunately GLP1 no longer covered. Will restart metformin.  F/U in 4 months.

## 2022-07-21 NOTE — Progress Notes (Signed)
Established Patient Office Visit  Subjective   Patient ID: Tonya Clark, female    DOB: 03/10/1996  Age: 27 y.o. MRN: [id number]  Chief Complaint  Patient presents with   Eye Problem    Patient states that she hs had episodes of her eyes feeling as though they were shaking and also unable to see "eyes blacking out" she states in the last week this has happened 7 x's    HPI  She was recently seen by one of my partners for vision abnormalities.  She reports that she has occasional sowed's where she feels like her vision goes completely black and show noticed that her eyes will shake back-and-forth for few seconds.  First time it happened was probably 8 to 12 months ago.  But last week it happened on 5 different occasions which was unusual she did go see her optometrist on Friday.  Did have some labs done.  Prolactin level was normal.  1C was 5.7 but stable.  Rate was quite elevated at 103 and CRP was mildly elevated as well.  ANA was negative.  Vision is okay otherwise not noticing any problems in fact she just had an up-to-date eye exam done in December.  No unusual headaches.  CT performed at [facility] was negative  Impaired fasting glucose-she had to stop the [facility] because of her insurance change.  But she would like to get back on metformin for now.  She did check with her insurance and they will not cover any of the new GLP-1's for weight loss.  She will be changing from Lybalvi to olanzapine bc of cost and insurance coverage.   Also recent labs showed a low hemoglobin.  Has been taking Flintstones since the summer when she was told that her iron was low at that time.    ROS    Objective:     BP (!) 143/70   Pulse 95   Ht '5\' 6"'$  (1.676 m)   Wt 285 lb 8 oz (129.5 kg)   SpO2 97%   BMI 46.08 kg/m    Physical Exam Vitals and nursing note reviewed.  Constitutional:      Appearance: She is well-developed.  HENT:     Head: Normocephalic and atraumatic.     Right Ear:  Tympanic membrane, ear canal and external ear normal.     Left Ear: Tympanic membrane, ear canal and external ear normal.     Nose: Nose normal.  Eyes:     Conjunctiva/sclera: Conjunctivae normal.     Pupils: Pupils are equal, round, and reactive to light.  Neck:     Thyroid: No thyromegaly.  Cardiovascular:     Rate and Rhythm: Normal rate and regular rhythm.     Heart sounds: Normal heart sounds.  Pulmonary:     Effort: Pulmonary effort is normal.     Breath sounds: Normal breath sounds. No wheezing.  Musculoskeletal:     Cervical back: Neck supple.  Lymphadenopathy:     Cervical: No cervical adenopathy.  Skin:    General: Skin is warm and dry.  Neurological:     Mental Status: She is alert and oriented to person, place, and time. Mental status is at baseline.     Comments: EOM intact  Psychiatric:        Behavior: Behavior normal.      No results found for any visits on 07/21/22.    The ASCVD Risk score (Arnett DK, et al., 2019) failed to calculate for  the following reasons:   The 2019 ASCVD risk score is only valid for ages 8 to 36    Assessment & Plan:   Problem List Items Addressed This Visit       Endocrine   IFG (impaired fasting glucose)    Unfortunately GLP1 no longer covered. Will restart metformin.  F/U in 4 months.        Relevant Medications   metFORMIN (GLUCOPHAGE-XR) 500 MG 24 hr tablet   Other Relevant Orders   Ferritin   Cortisol   TSH   Other Visit Diagnoses     Transient vision disturbance of both eyes    -  Primary   Relevant Orders   Ferritin   Cortisol   TSH   Ambulatory referral to Neurology   Iron deficiency       Relevant Orders   Ferritin   Cortisol   TSH   Elevated sed rate          Unclear cause of visual disturbance.  She did have a negative head CT, though could consider getting an MRI for further evaluation and maybe even an MRA to look at the vasculature. May be worthwhile.  Will consult neurology at this point  and get their assistance with this.  She did have a recent eye exam which was normal.  Interestingly her sed rate was [id number] so certainly could be negative of significant inflammation.  Also consider occulogenic crisis as an extrapyramidal side effect from her psych medications.   Iron def - discussed starting Slow FE 1- 2 time per day and then recheck in 8 weeks. Will get baseline ferritin today.    No follow-ups on file.    Beatrice Lecher, MD

## 2022-07-22 ENCOUNTER — Other Ambulatory Visit: Payer: Self-pay | Admitting: *Deleted

## 2022-07-22 DIAGNOSIS — E611 Iron deficiency: Secondary | ICD-10-CM

## 2022-07-22 LAB — CORTISOL: Cortisol, Plasma: 11.2 ug/dL

## 2022-07-22 LAB — FERRITIN: Ferritin: 21 ng/mL (ref 16–154)

## 2022-07-22 LAB — TSH: TSH: 0.8 mIU/L

## 2022-07-22 NOTE — Progress Notes (Signed)
HI Tonya Clark.  Your ferritin is 21.  This is helpful so it will give [place] a baseline to track it is really reflection of your iron stores our goal is to get you back up to 40 or higher similar to what it was about 6 years ago.  So definitely pick up some over-the-counter iron the Slow Fe is a good choice and then will recheck in 8 weeks to make sure that were trending upward.  Your thyroid looks great.  1 test still pending.

## 2022-07-23 NOTE — Progress Notes (Signed)
Your a.m. cortisol level looks good.  This is not a definitive test but at least is a good screening tool.

## 2022-07-27 ENCOUNTER — Ambulatory Visit: Payer: BC Managed Care – PPO | Admitting: Family Medicine

## 2022-07-27 NOTE — Telephone Encounter (Signed)
Concerns of symptoms were addressed on 07/21/22 with Dr. Madilyn Fireman.

## 2022-08-02 ENCOUNTER — Encounter: Payer: Self-pay | Admitting: Family Medicine

## 2022-08-23 ENCOUNTER — Ambulatory Visit: Payer: Self-pay | Admitting: Licensed Clinical Social Worker

## 2022-08-23 NOTE — Patient Instructions (Signed)
Visit Information  Thank you for taking time to visit with me today. Please don't hesitate to contact me if I can be of assistance to you.   Following are the goals we discussed today:   Goals Addressed             This Visit's Progress    Patient encouraged to talk with PCP about program support       Interventions: LCSW spoke via phone today with client. LCSW talked with Tonya Clark about program support with RN, LCSW and Pharmacist. LCSW discussed client needs  with client. She said she thought she was doing pretty well at present. She does see [name] as PCP. LCSW encouraged [name] to speak with PCP about Care Coordination program support. Client appreciative of call.  Client agreed for LCSW to call her again in 2 months to check on status and needs of client at that time      Our next appointment is by telephone on 10/25/22 1:00 PM   Please call the care guide team at [phone] if you need to cancel or reschedule your appointment.   If you are experiencing a Mental Health or [place] or need someone to talk to, please go to [facility] [place] [phone])   The patient verbalized understanding of instructions, educational materials, and care plan provided today and DECLINED offer to receive copy of patient instructions, educational materials, and care plan.   The patient has been provided with contact information for the care management team and has been advised to call with any health related questions or concerns.   Norva Riffle.Delorese Sellin MSW, [place] [facility] representative [facility] Care Management [phone]

## 2022-08-23 NOTE — Patient Outreach (Signed)
  Care Coordination   Initial Visit Note   08/23/2022 Name: Tonya Clark MRN: [id number] DOB: August 01, 1995  Tonya Clark is a 27 y.o. year old female who sees Metheney, Rene Kocher, MD for primary care. I spoke with  Tonya Clark by phone today.  What matters to the patients health and wellness today?  Patient encouraged to talk with PCP about program  support.     Goals Addressed             This Visit's Progress    Patient encouraged to talk with PCP about program support       Interventions: LCSW spoke via phone today with client. LCSW talked with Tonya Clark about program support with RN, LCSW and Pharmacist. LCSW discussed client needs  with client. She said she thought she was doing pretty well at present. She does see [name] as PCP. LCSW encouraged Tonya Clark to speak with PCP about Care Coordination program support. Client appreciative of call.  Client agreed for LCSW to call her again in 2 months to check on status and needs of client at that time      SDOH assessments and interventions completed:  Yes  SDOH Interventions Today    Flowsheet Row Most Recent Value  SDOH Interventions   Physical Activity Interventions Other (Comments)  [may have some walking challenges]  Stress Interventions Other (Comment)  [client may have stress related to managing medical needs]        Care Coordination Interventions:  Yes, provided   Interventions Today    Flowsheet Row Most Recent Value  Chronic Disease   Chronic disease during today's visit Other  [discussed client needs with client via phone today]  General Interventions   General Interventions Discussed/Reviewed General Interventions Discussed, [employer] program support with RN, LCSW and Pharmacist]  Education Interventions   Education Provided Provided Education  Provided Verbal Education On [place] Discussed/Reviewed Anxiety,  Coping Strategies        Follow up plan: Follow up call scheduled for 10/25/22 at 1:00 PM     Encounter Outcome:  Pt. Visit Completed   Tonya Clark.Tonya Clark MSW, [place] [facility] representative [facility] Care Management [phone]

## 2022-08-24 ENCOUNTER — Other Ambulatory Visit: Payer: Self-pay | Admitting: Obstetrics & Gynecology

## 2022-08-24 DIAGNOSIS — Z3041 Encounter for surveillance of contraceptive pills: Secondary | ICD-10-CM

## 2022-08-27 IMAGING — DX DG CHEST 2V
2 series · 2 of 2 positions shown · non-contrast
Comparison: Chest x-ray dated 10/02/2020

CLINICAL DATA: Cough and shortness of breath.

EXAM:
CHEST - 2 VIEW

[chest pa]
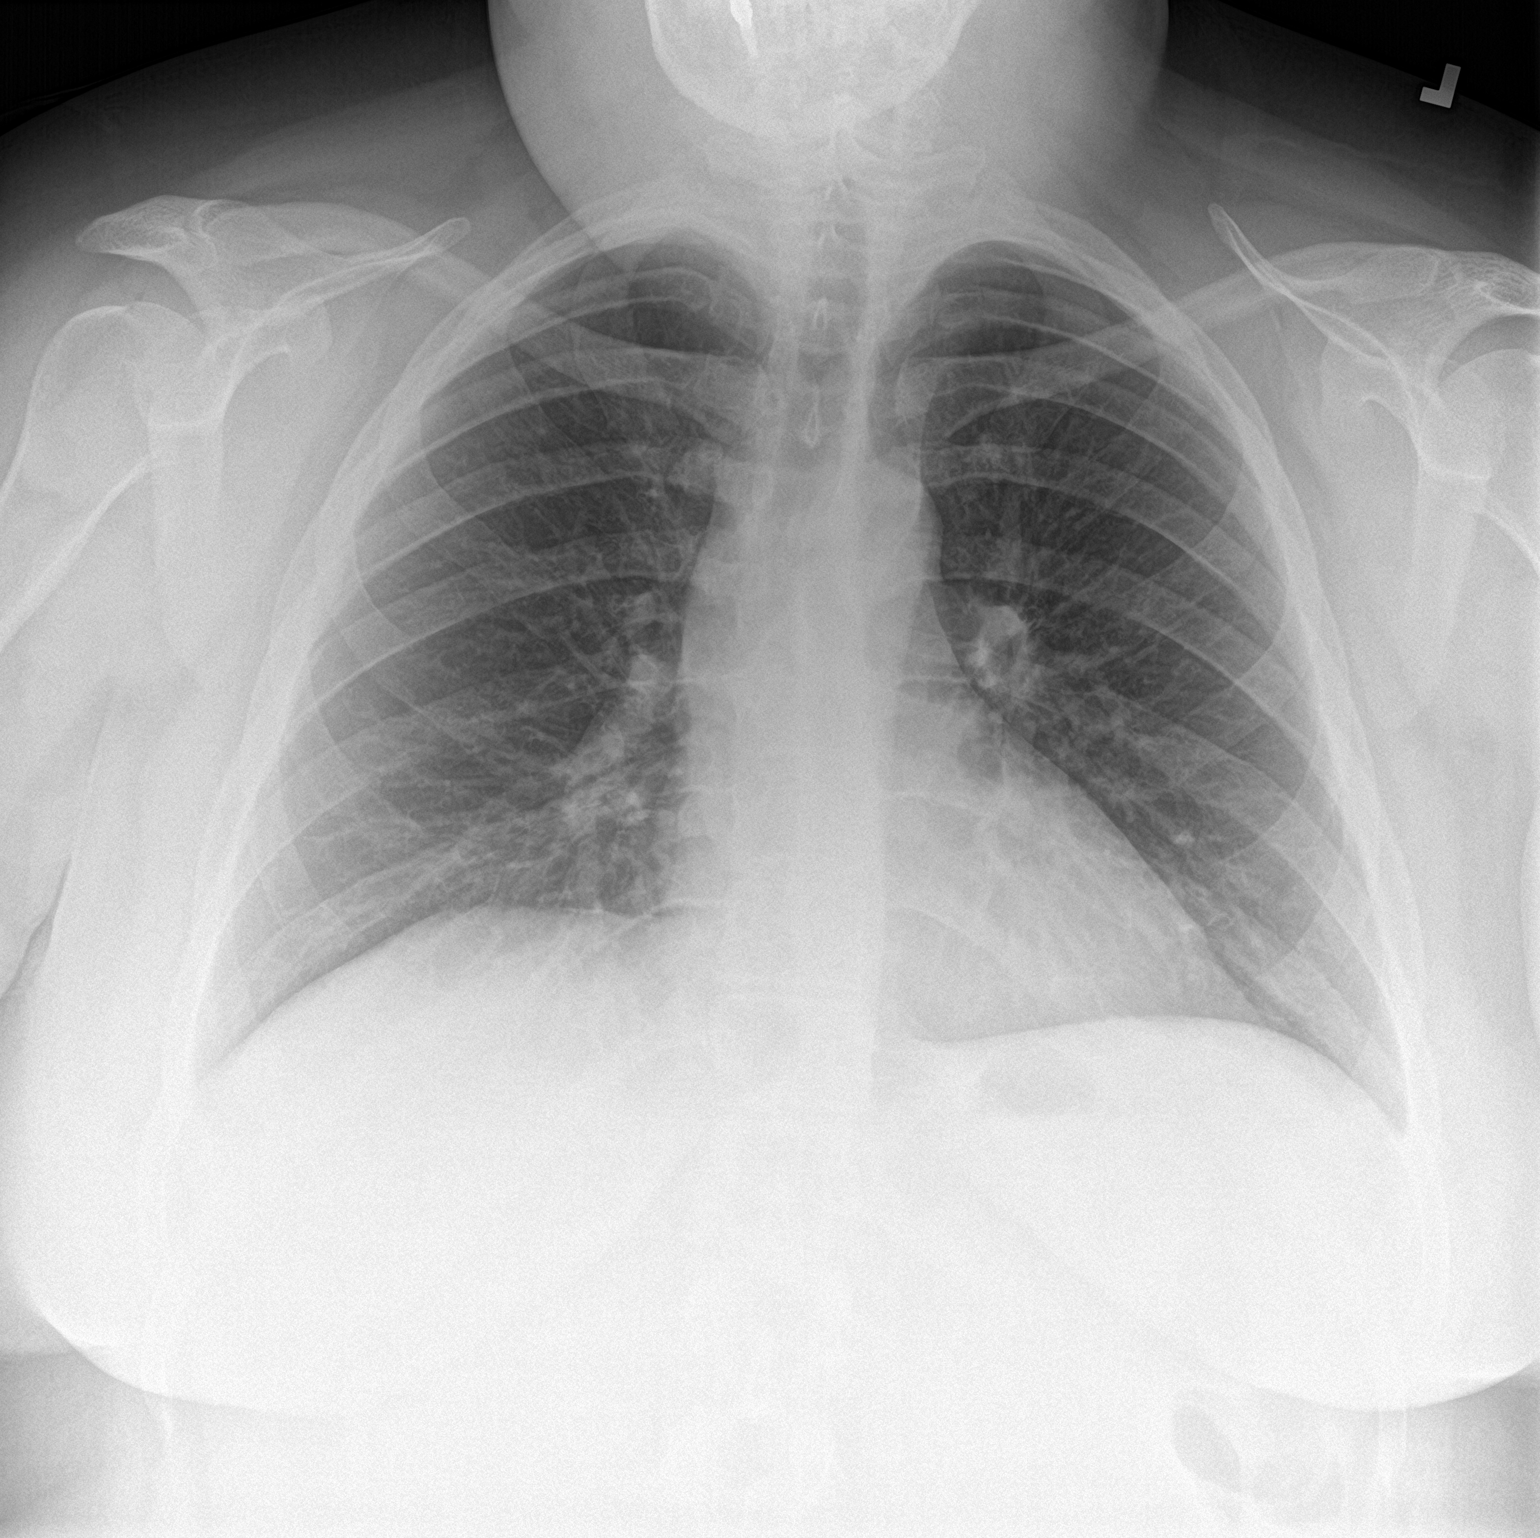

[chest lat]
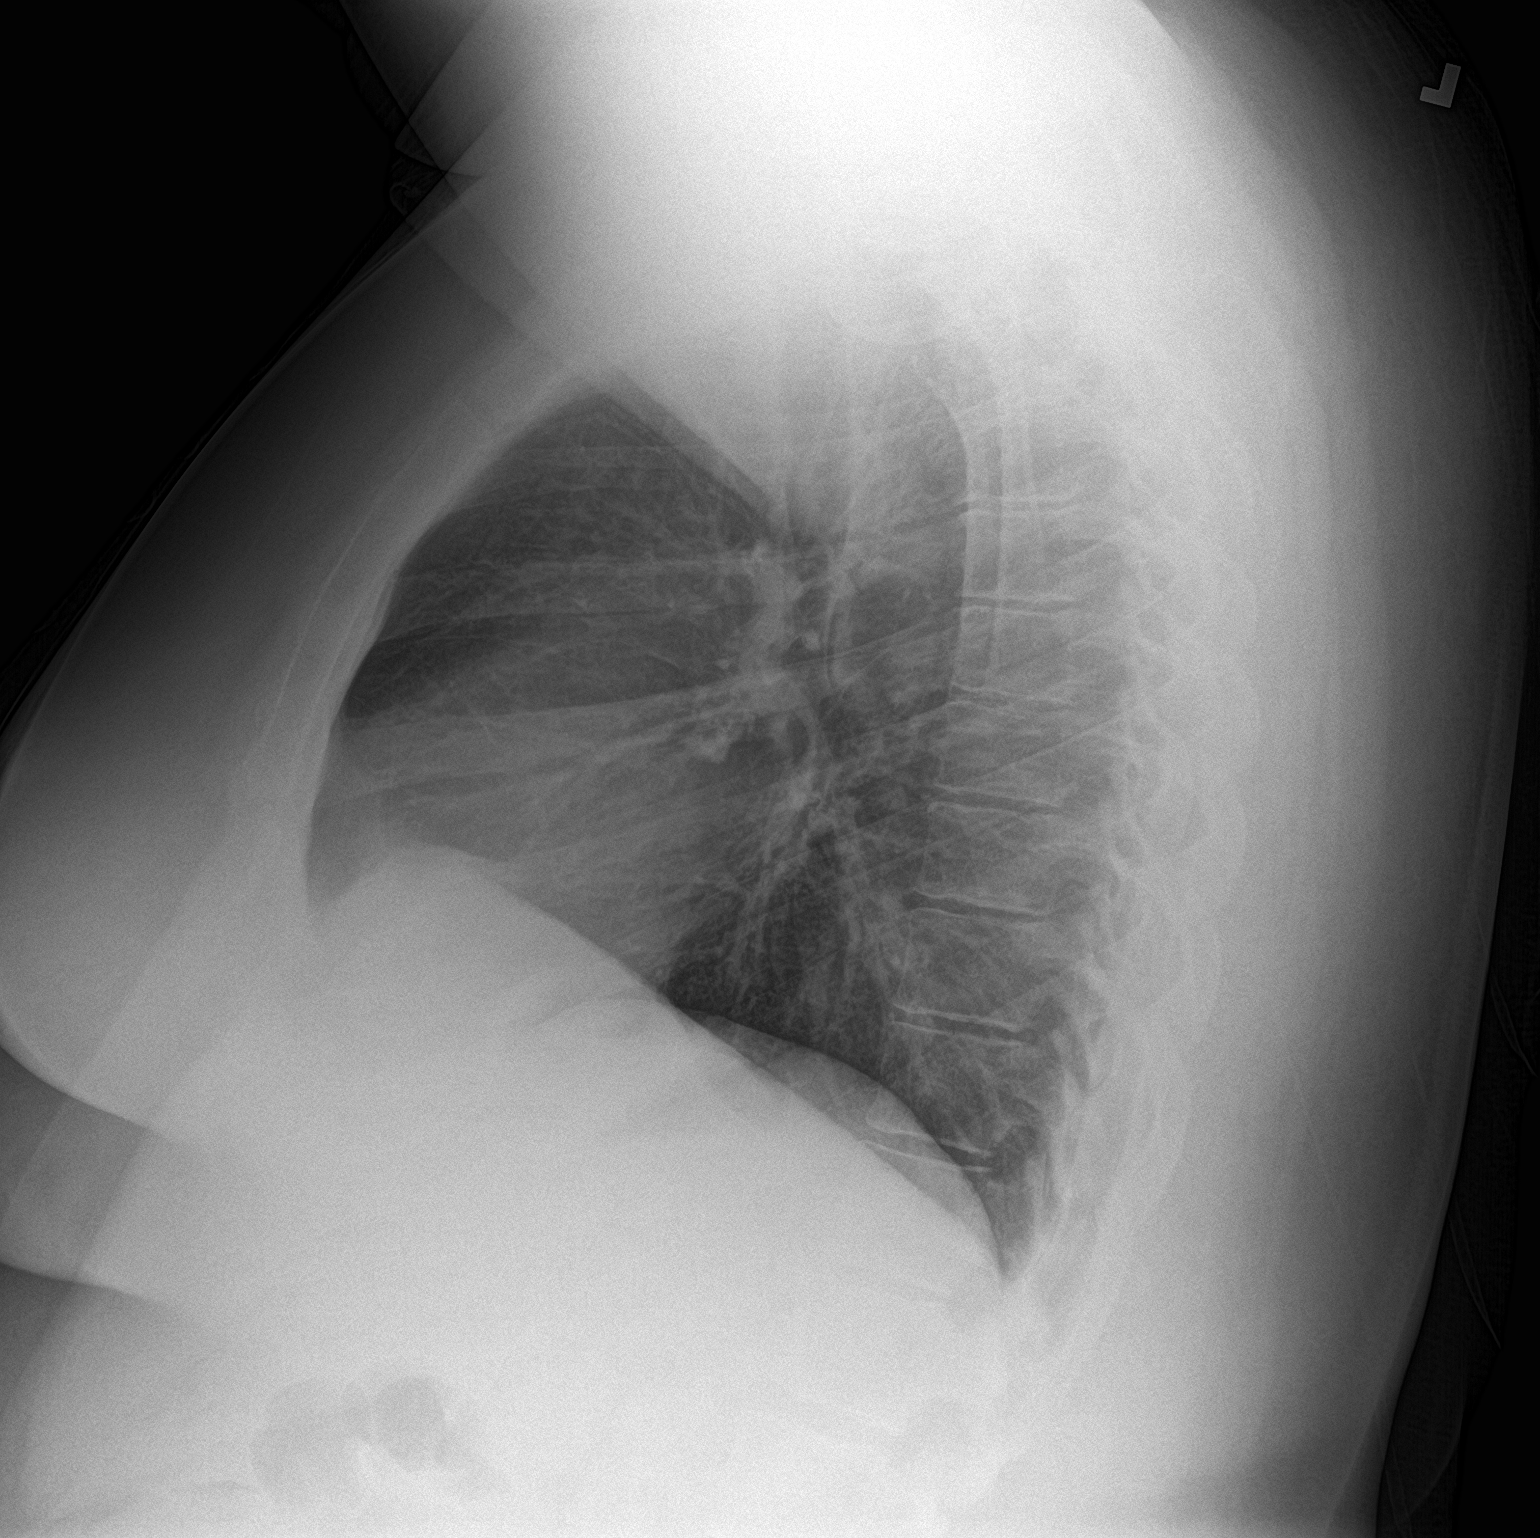

[2 of 2 positions shown; findings below may reference images not displayed]

FINDINGS: Heart size and mediastinal contours are within normal limits. Lungs
are clear. No pleural effusion or pneumothorax is seen. Osseous
structures about the chest are unremarkable.
IMPRESSION: Normal chest x-ray.  No evidence of pneumonia.

## 2022-08-30 ENCOUNTER — Other Ambulatory Visit: Payer: Self-pay | Admitting: Cardiology

## 2022-08-31 NOTE — Progress Notes (Unsigned)
     Established patient visit   Patient: Tonya Clark   DOB: [birth year]   27 y.o. Female  MRN: [id number] Visit Date: 09/01/2022  Today's healthcare provider: Charlton Amor, DO   No chief complaint on file.   SUBJECTIVE   No chief complaint on file.  HPI  Pt presents with dysuria  Review of Systems     No outpatient medications have been marked as taking for the 09/01/22 encounter (Appointment) with Charlton Amor, DO.    OBJECTIVE    There were no vitals taken for this visit.  Physical Exam   {Show previous labs (optional):23736}    ASSESSMENT & PLAN    Problem List Items Addressed This Visit   None   No follow-ups on file.      No orders of the defined types were placed in this encounter.   No orders of the defined types were placed in this encounter.    Charlton Amor, DO  [facility] at [facility] [phone] (phone) [phone] (fax)  [facility]

## 2022-09-01 ENCOUNTER — Encounter: Payer: Self-pay | Admitting: Family Medicine

## 2022-09-01 ENCOUNTER — Ambulatory Visit: Payer: BC Managed Care – PPO | Admitting: Family Medicine

## 2022-09-01 VITALS — BP 146/86 | HR 88 | Temp 97.9°F | Ht 66.0 in | Wt 299.0 lb

## 2022-09-01 DIAGNOSIS — R300 Dysuria: Secondary | ICD-10-CM | POA: Insufficient documentation

## 2022-09-01 DIAGNOSIS — N3000 Acute cystitis without hematuria: Secondary | ICD-10-CM | POA: Diagnosis not present

## 2022-09-01 LAB — POCT URINALYSIS DIP (CLINITEK)
Bilirubin, UA: NEGATIVE
Blood, UA: NEGATIVE
Glucose, UA: NEGATIVE mg/dL
Ketones, POC UA: NEGATIVE mg/dL
Nitrite, UA: NEGATIVE
POC PROTEIN,UA: NEGATIVE
Spec Grav, UA: >=1.03 — AB (ref 1.010–1.025)
Urobilinogen, UA: 0.2 E.U./dL
pH, UA: 6 (ref 5.0–8.0)

## 2022-09-01 MED ORDER — CEPHALEXIN 500 MG PO CAPS
500.0000 mg | ORAL_CAPSULE | Freq: Two times a day (BID) | ORAL | 0 refills | Status: AC
Start: 2022-09-01 — End: 2022-09-06

## 2022-09-01 NOTE — Assessment & Plan Note (Signed)
-   poc ua done and shows positive leuks  - based on symptoms and leuks seen on poc ua will go ahead and start patient on keflex. Will also culture  - specific gravity elevated which indicates dehydration, encouraged pt to drink water - discussed uti prevention practices: proper vaginal hygiene

## 2022-09-08 LAB — URINE CULTURE
MICRO NUMBER:: 14824605
SPECIMEN QUALITY:: ADEQUATE

## 2022-09-08 LAB — EXTRA URINE SPECIMEN

## 2022-10-04 ENCOUNTER — Encounter: Payer: Self-pay | Admitting: Family Medicine

## 2022-10-04 ENCOUNTER — Ambulatory Visit: Payer: BC Managed Care – PPO | Admitting: Family Medicine

## 2022-10-04 VITALS — BP 132/86 | HR 116 | Temp 100.7°F | Ht 66.0 in | Wt 304.0 lb

## 2022-10-04 DIAGNOSIS — J029 Acute pharyngitis, unspecified: Secondary | ICD-10-CM | POA: Diagnosis not present

## 2022-10-04 DIAGNOSIS — R509 Fever, unspecified: Secondary | ICD-10-CM

## 2022-10-04 DIAGNOSIS — R7301 Impaired fasting glucose: Secondary | ICD-10-CM | POA: Diagnosis not present

## 2022-10-04 DIAGNOSIS — L858 Other specified epidermal thickening: Secondary | ICD-10-CM | POA: Diagnosis not present

## 2022-10-04 DIAGNOSIS — J020 Streptococcal pharyngitis: Secondary | ICD-10-CM

## 2022-10-04 LAB — POCT GLYCOSYLATED HEMOGLOBIN (HGB A1C): Hemoglobin A1C: 5.2 % (ref 4.0–5.6)

## 2022-10-04 LAB — POCT INFLUENZA A/B
Influenza A, POC: NEGATIVE
Influenza B, POC: NEGATIVE

## 2022-10-04 LAB — POC COVID19 BINAXNOW: SARS Coronavirus 2 Ag: NEGATIVE

## 2022-10-04 LAB — POCT RAPID STREP A (OFFICE): Rapid Strep A Screen: POSITIVE — AB

## 2022-10-04 MED ORDER — PENICILLIN V POTASSIUM 500 MG PO TABS: 500.0000 mg | ORAL_TABLET | Freq: Two times a day (BID) | ORAL | 0 refills | Status: AC

## 2022-10-04 NOTE — Progress Notes (Signed)
Established Patient Office Visit  Subjective   Patient ID: Tonya Clark, female    DOB: [birth year]  Age: 27 y.o. MRN: [id number]  Chief Complaint  Patient presents with   ifg    HPI  Impaired fasting glucose-no increased thirst or urination. No symptoms consistent with hypoglycemia.  She reports she started feeling bad yesterday with a very sore throat.  She thinks she might of run a fever today.  She did take some Tylenol and has not a little better with that.  No chest pain or cough.  Also has a skin lesion on her left lower leg that she would like me to look at today has been there for about 2 years and has not really changed but she mentioned it to a family member who was had skin cancer and they recommended that she have it checked out.  She also wanted to know what she could do for her keratosis Pilar S.      ROS    Objective:     BP 132/86   Pulse (!) 116   Temp (!) 100.7 F (38.2 C)   Ht 5\' 6"  (1.676 m)   Wt (!) 304 lb (137.9 kg)   SpO2 99%   BMI 49.07 kg/m    Physical Exam Vitals and nursing note reviewed.  Constitutional:      Appearance: She is well-developed.  HENT:     Head: Normocephalic and atraumatic.     Right Ear: Tympanic membrane, ear canal and external ear normal.     Left Ear: Tympanic membrane, ear canal and external ear normal.     Nose: Nose normal.     Mouth/Throat:     Pharynx: Posterior oropharyngeal erythema present. No oropharyngeal exudate.  Eyes:     Conjunctiva/sclera: Conjunctivae normal.     Pupils: Pupils are equal, round, and reactive to light.  Neck:     Thyroid: No thyromegaly.  Cardiovascular:     Rate and Rhythm: Normal rate and regular rhythm.     Heart sounds: Normal heart sounds.  Pulmonary:     Effort: Pulmonary effort is normal.     Breath sounds: Normal breath sounds. No wheezing.  Musculoskeletal:     Cervical back: Neck supple.  Lymphadenopathy:     Cervical: No cervical adenopathy.  Skin:     General: Skin is warm and dry.  Neurological:     Mental Status: She is alert and oriented to person, place, and time.  Psychiatric:        Behavior: Behavior normal.      Results for orders placed or performed in visit on 10/04/22  POCT glycosylated hemoglobin (Hb A1C)  Result Value Ref Range   Hemoglobin A1C 5.2 4.0 - 5.6 %   HbA1c POC (<> result, manual entry)     HbA1c, POC (prediabetic range)     HbA1c, POC (controlled diabetic range)    POC COVID-19  Result Value Ref Range   SARS Coronavirus 2 Ag Negative Negative  POCT Influenza A/B  Result Value Ref Range   Influenza A, POC Negative Negative   Influenza B, POC Negative Negative  POCT rapid strep A  Result Value Ref Range   Rapid Strep A Screen Positive (A) Negative      The ASCVD Risk score (Arnett DK, et al., 2019) failed to calculate for the following reasons:   The 2019 ASCVD risk score is only valid for ages 28 to 75    Assessment & Plan:  Problem List Items Addressed This Visit       Endocrine   IFG (impaired fasting glucose) - Primary    A1c looks awesome today at 5.2.  Continue current regimen we will continue to monitor plan to recheck again in 6 months.   Lab Results  Component Value Date   HGBA1C 5.2 10/04/2022        Relevant Orders   POCT glycosylated hemoglobin (Hb A1C) (Completed)   Other Visit Diagnoses     Keratosis pilaris       Pharyngitis, unspecified etiology       Relevant Orders   POC COVID-19 (Completed)   POCT Influenza A/B (Completed)   POCT rapid strep A (Completed)   Fever, unspecified fever cause       Relevant Orders   POC COVID-19 (Completed)   POCT Influenza A/B (Completed)   POCT rapid strep A (Completed)   Strep throat          Keratosis Pilar is-discussed using an over-the-counter cream that has either lactic acid or hyaluronic acid to help break down the extra keratin.  It is a benign condition.    Return in about 6 months (around 04/06/2023) for  Pre-diabetes.    Nani Gasser, MD

## 2022-10-04 NOTE — Assessment & Plan Note (Addendum)
A1c looks awesome today at 5.2.  Continue current regimen we will continue to monitor plan to recheck again in 6 months.   Lab Results  Component Value Date   HGBA1C 5.2 10/04/2022

## 2022-10-06 ENCOUNTER — Encounter: Payer: Self-pay | Admitting: Family Medicine

## 2022-10-07 MED ORDER — PREDNISONE 20 MG PO TABS
40.0000 mg | ORAL_TABLET | Freq: Every day | ORAL | 0 refills | Status: DC
Start: 2022-10-07 — End: 2023-03-11

## 2022-10-25 ENCOUNTER — Ambulatory Visit: Payer: Self-pay | Admitting: Licensed Clinical Social Worker

## 2022-10-25 NOTE — Patient Instructions (Signed)
Visit Information  Thank you for taking time to visit with me today. Please don't hesitate to contact me if I can be of assistance to you.   Following are the goals we discussed today:   Goals Addressed             This Visit's Progress    Encouraged patient to talk with PCP about program support for client       Interventions:  LCSW spoke today via phone with Ebbie Latus, mother and contact of client, about client needs LCSW discussed program support available for client. Discussed client support with client PCP, Dr. Revonda Standard, mother of client, said client is now living alone in the community. Blanchie Crowe said client was doing fairly well. Elnita Maxwell said client works her job regularly Discussed program support help available with RN, LCSW and Pharmacist Encouraged client to talk with client PCP about program support for client. Thanked Ebbie Latus for phone call with LCSW today Encouraged client to call LCSW as needed to further discuss program support at [phone].        Our next appointment is by telephone on 12/20/22 at 2:00 PM   Please call the care guide team at [phone] if you need to cancel or reschedule your appointment.   If you are experiencing a Mental Health or Behavioral Health Crisis or need someone to talk to, please go to [facility] [address] [place] [phone])   The patient / Tonya Clark, mother of patient, verbalized understanding of instructions, educational materials, and care plan provided today and DECLINED offer to receive copy of patient instructions, educational materials, and care plan.   The patient / Tonya Clark, mother of patient, has been provided with contact information for the care management team and has been advised to call with any health related questions or concerns.   Kelton Pillar.Revanth Neidig MSW, LCSW Licensed Visual merchandiser [facility] Care Management [phone]

## 2022-10-25 NOTE — Patient Outreach (Signed)
  Care Coordination   Follow Up Visit Note   10/25/2022 Name: Hillery Buzzelli MRN: [id number] DOB: 09/10/1995  Kathlynn Partridge is a 27 y.o. year old female who sees Metheney, Barbarann Ehlers, MD for primary care. I spoke with  [name] by phone today.  What matters to the patients health and wellness today?  Encouraged patient to talk with PCP about program support for client     Goals Addressed             This Visit's Progress    Encouraged patient to talk with PCP about program support for client       Interventions:  LCSW spoke via phone today with client. Discussed needs of client Client said she sees PCP, Dr. Nani Gasser for medical support Client said she also sees a counselor for counseling support. Client feels that counselor is helpful to client . Client did not mention any mood issues LCSW described program support related to RN support, LCSW support and Pharmacy support Client said she felt that her basic needs were met at present. She said she did not feel that she needed program support at this time. LCSW thanked client for phone call today. LCSW encouraged client to talk further in the future with PCP if client feels that program could be helpful to client at future time Client was appreciative of call from LCSW today          SDOH assessments and interventions completed:  Yes  SDOH Interventions Today    Flowsheet Row Most Recent Value  SDOH Interventions   Depression Interventions/Treatment  Currently on Treatment  Physical Activity Interventions Other (Comments)  [may have decreased activity]  Stress Interventions Provide Counseling  [stress in managing medical needs]        Care Coordination Interventions:  Yes, provided   Interventions Today    Flowsheet Row Most Recent Value  Chronic Disease   Chronic disease during today's visit Other  [spoke with client about client needs]  General Interventions   General Interventions Discussed/Reviewed  General Interventions Discussed, [employer]  [discussed program support with client]  Education Interventions   Education Provided Provided Education  Provided Verbal Education On [employer]  [discussed support available with RN, LCSW, and Pharmacist]  Mental Health Interventions   Mental Health Discussed/Reviewed Anxiety, Coping Strategies  [client said she is receiving support from a counselor. she feels that support of counselor will be helpful to her]  Pharmacy Interventions   Pharmacy Dicussed/Reviewed Pharmacy Topics Discussed       Follow up plan: No follow up calls needed at this time   Encounter Outcome:  Pt. Visit Completed   Kelton Pillar.Asaf Elmquist MSW, LCSW Licensed Visual merchandiser [facility] Care Management [phone]

## 2022-10-25 NOTE — Patient Instructions (Signed)
Visit Information  Thank you for taking time to visit with me today. Please don't hesitate to contact me if I can be of assistance to you.   Following are the goals we discussed today:   Goals Addressed             This Visit's Progress    Encouraged patient to talk with PCP about program support for client       Interventions:  LCSW spoke via phone today with client. Discussed needs of client Client said she sees PCP, Dr. Nani Gasser for medical support Client said she also sees a counselor for counseling support. Client feels that counselor is helpful to client . Client did not mention any mood issues LCSW described program support related to RN support, LCSW support and Pharmacy support Client said she felt that her basic needs were met at present. She said she did not feel that she needed program support at this time. LCSW thanked client for phone call today. LCSW encouraged client to talk further in the future with PCP if client feels that program could be helpful to client at future time Client was appreciative of call from LCSW today         No follow up calls needed at this time.  Please call the care guide team at [phone] if you need to cancel or reschedule your appointment.   If you are experiencing a Mental Health or Behavioral Health Crisis or need someone to talk to, please go to [facility] [address] [place] [phone])   The patient verbalized understanding of instructions, educational materials, and care plan provided today and DECLINED offer to receive copy of patient instructions, educational materials, and care plan.   The patient has been provided with contact information for the care management team and has been advised to call with any health related questions or concerns.   Kelton Pillar.Hedda Crumbley MSW, LCSW Licensed Visual merchandiser [facility] Care Management [phone]

## 2022-10-25 NOTE — Patient Outreach (Signed)
  Care Coordination   Follow Up Visit Note   10/25/2022 Name: Tonya Clark MRN: [id number] DOB: [birth year]  Tonya Clark is a 27 y.o. year old female who sees Metheney, Barbarann Ehlers, MD for primary care. I spoke with  Tonya Clark Tonya Clark, mother and contact for client, via phone today.  What matters to the patients health and wellness today? Encouraged patient to talk with PCP about program support for client    Goals Addressed             This Visit's Progress    Encouraged patient to talk with PCP about program support for client       Interventions:  LCSW spoke today via phone with Tonya Clark, mother and contact of client, about client needs LCSW discussed program support available for client. Discussed client support with client PCP, Dr. Revonda Clark, mother of client, said client is now living alone in the community. Tonya Clark said client was doing fairly well. Tonya Clark said client works her job regularly Discussed program support help available with RN, LCSW and Pharmacist Encouraged client to talk with client PCP about program support for client. Thanked Tonya Clark for phone call with LCSW today Encouraged client to call LCSW as needed to further discuss program support at [phone].        SDOH assessments and interventions completed:  Yes  SDOH Interventions Today    Flowsheet Row Most Recent Value  SDOH Interventions   Physical Activity Interventions Other (Comments)  [may have decreased activity level]  Stress Interventions Other (Comment)  [stress in managing medical needs]        Care Coordination Interventions:  Yes, provided   Interventions Today    Flowsheet Row Most Recent Value  Chronic Disease   Chronic disease during today's visit Other  [spoke via phone with Tonya Clark, mother and contact for client, about client needs]  General Interventions   General Interventions Discussed/Reviewed General Interventions  Discussed, Community Resources  [discussed program support]  Education Interventions   Education Provided Provided Education  Provided Verbal Education On [employer]  Mental Health Interventions   Mental Health Discussed/Reviewed Anxiety, Coping Strategies  Tonya Clark, mother of client, said client was living on her own and doing fairly well now]        Follow up plan: Follow up call scheduled for 12/20/22 at 2:00 PM     Encounter Outcome:  Pt. Visit Completed {THN Tip this will not be part of the note when signed-REQUIRED REPORT FIELD DO NOT DELETE  Tonya Clark.Tonya Clark MSW, LCSW Licensed Visual merchandiser [facility] Care Management [phone]

## 2022-12-08 ENCOUNTER — Other Ambulatory Visit: Payer: Self-pay | Admitting: Cardiology

## 2022-12-20 ENCOUNTER — Ambulatory Visit: Payer: Self-pay | Admitting: Licensed Clinical Social Worker

## 2022-12-20 NOTE — Patient Outreach (Signed)
  Care Coordination   Follow Up Visit Note   12/20/2022 Name: Tonya Clark MRN: [id number] DOB: 23-Feb-1996  Tonya Clark is a 27 y.o. year old female who sees Metheney, Barbarann Ehlers, MD for primary care. I spoke with  Tonya Clark by phone today.  What matters to the patients health and wellness today? Patient encouraged to talk with PCP about program support as needed    Goals Addressed             This Visit's Progress    Patient encouraged to talk with PCP about program support       Interventions: LCSW spoke via phone today with client about client needs Discussed program support with RN, LCSW, Pharmacist Client is currently receiving counseling support to address her mental health needs. She enjoys counseling support LCSW discussed client needs  with client. She said she thought she was doing pretty well at present. She does see Dr. Nani Gasser as PCP. She does not think she has any program needs at present Encouraged client to call LCSW at [phone] if she has any further questions about program services Client was appreciative of call from LCSW today          SDOH assessments and interventions completed:  Yes  SDOH Interventions Today    Flowsheet Row Most Recent Value  SDOH Interventions   Depression Interventions/Treatment  Currently on Treatment  Physical Activity Interventions Other (Comments)  [may have decreased activity level]  Stress Interventions Other (Comment)  [is working with counselor to receive counseling support]        Care Coordination Interventions:  Yes, provided   Interventions Today    Flowsheet Row Most Recent Value  Chronic Disease   Chronic disease during today's visit Other  [spoke with client about client needs]  General Interventions   General Interventions Discussed/Reviewed General Interventions Discussed, Community Resources  Mental Health Interventions   Mental Health Discussed/Reviewed Coping Strategies, Anxiety   [patient is receiving counseling support currently]        Follow up plan: No further intervention required.   Encounter Outcome:  Pt. Visit Completed   Kelton Pillar.Ladd Cen MSW, LCSW Licensed Clinical Social Worker [facility] Care Management [phone]

## 2022-12-20 NOTE — Patient Instructions (Signed)
Visit Information  Thank you for taking time to visit with me today. Please don't hesitate to contact me if I can be of assistance to you.   Following are the goals we discussed today:   Goals Addressed             This Visit's Progress    Patient encouraged to talk with PCP about program support       Interventions: LCSW spoke via phone today with client about client needs Discussed program support with RN, LCSW, Pharmacist Client is currently receiving counseling support to address her mental health needs. She enjoys counseling support LCSW discussed client needs  with client. She said she thought she was doing pretty well at present. She does see Dr. Nani Gasser as PCP. She does not think she has any program needs at present Encouraged client to call LCSW at [phone] if she has any further questions about program services Client was appreciative of call from LCSW today         No further intervention required   Please call the care guide team at [phone] if you need to cancel or reschedule your appointment.   If you are experiencing a Mental Health or Behavioral Health Crisis or need someone to talk to, please go to [facility] [address] [place] [phone])   The patient verbalized understanding of instructions, educational materials, and care plan provided today and DECLINED offer to receive copy of patient instructions, educational materials, and care plan.   The patient has been provided with contact information for the care management team and has been advised to call with any health related questions or concerns.   Tonya Clark.Tonya Clark MSW, LCSW Licensed Visual merchandiser [facility] Care Management [phone]

## 2023-01-04 ENCOUNTER — Other Ambulatory Visit: Payer: Self-pay | Admitting: Cardiology

## 2023-01-08 ENCOUNTER — Other Ambulatory Visit: Payer: Self-pay | Admitting: Cardiology

## 2023-01-09 ENCOUNTER — Telehealth: Payer: BC Managed Care – PPO | Admitting: Nurse Practitioner

## 2023-01-09 DIAGNOSIS — J019 Acute sinusitis, unspecified: Secondary | ICD-10-CM | POA: Diagnosis not present

## 2023-01-09 MED ORDER — AMOXICILLIN-POT CLAVULANATE 875-125 MG PO TABS
1.0000 | ORAL_TABLET | Freq: Two times a day (BID) | ORAL | 0 refills | Status: AC
Start: 2023-01-09 — End: 2023-01-16

## 2023-01-09 NOTE — Progress Notes (Signed)

## 2023-01-09 NOTE — Progress Notes (Signed)
I have spent 5 minutes in review of e-visit questionnaire, review and updating patient chart, medical decision making and response to patient.  ° °Zelda W Fleming, NP ° °  °

## 2023-01-21 ENCOUNTER — Other Ambulatory Visit: Payer: Self-pay | Admitting: Cardiology

## 2023-01-25 ENCOUNTER — Other Ambulatory Visit: Payer: Self-pay | Admitting: Cardiology

## 2023-01-25 NOTE — Telephone Encounter (Signed)
Rx refill sent to pharmacy. 

## 2023-02-11 ENCOUNTER — Other Ambulatory Visit: Payer: Self-pay | Admitting: Cardiology

## 2023-02-17 ENCOUNTER — Other Ambulatory Visit: Payer: Self-pay | Admitting: Family Medicine

## 2023-02-17 DIAGNOSIS — Z3009 Encounter for other general counseling and advice on contraception: Secondary | ICD-10-CM

## 2023-02-17 DIAGNOSIS — Z760 Encounter for issue of repeat prescription: Secondary | ICD-10-CM

## 2023-03-11 ENCOUNTER — Ambulatory Visit: Payer: BC Managed Care – PPO | Admitting: Family Medicine

## 2023-03-11 ENCOUNTER — Encounter: Payer: Self-pay | Admitting: Family Medicine

## 2023-03-11 VITALS — BP 126/76 | HR 104 | Ht 66.0 in | Wt 313.0 lb

## 2023-03-11 DIAGNOSIS — Z23 Encounter for immunization: Secondary | ICD-10-CM

## 2023-03-11 DIAGNOSIS — R7301 Impaired fasting glucose: Secondary | ICD-10-CM

## 2023-03-11 DIAGNOSIS — L858 Other specified epidermal thickening: Secondary | ICD-10-CM | POA: Diagnosis not present

## 2023-03-11 DIAGNOSIS — I10 Essential (primary) hypertension: Secondary | ICD-10-CM | POA: Diagnosis not present

## 2023-03-11 DIAGNOSIS — G43709 Chronic migraine without aura, not intractable, without status migrainosus: Secondary | ICD-10-CM

## 2023-03-11 DIAGNOSIS — F319 Bipolar disorder, unspecified: Secondary | ICD-10-CM

## 2023-03-11 LAB — POCT GLYCOSYLATED HEMOGLOBIN (HGB A1C): Hemoglobin A1C: 5.3 % (ref 4.0–5.6)

## 2023-03-11 MED ORDER — DILTIAZEM HCL ER COATED BEADS 240 MG PO CP24: 240.0000 mg | ORAL_CAPSULE | Freq: Every day | ORAL | 3 refills | Status: DC

## 2023-03-11 MED ORDER — EMGALITY 120 MG/ML ~~LOC~~ SOAJ: 240.0000 mg | Freq: Once | SUBCUTANEOUS | 5 refills | Status: AC

## 2023-03-11 MED ORDER — TRETINOIN 0.025 % EX CREA: TOPICAL_CREAM | Freq: Every day | CUTANEOUS | 1 refills | Status: DC

## 2023-03-11 NOTE — Assessment & Plan Note (Signed)
Applying the Cereve daily for a couple of months and then it started to cause her to break out in a rash.  When she stopped it it would go away and then it would come back when she would restart it.  She wants to know what other options there are for treatment we discussed that we can use topical retinoid but will have to advance use slowly.  Warned about potential for redness irritation itching and burning.  Start with 3 days/week.

## 2023-03-11 NOTE — Assessment & Plan Note (Signed)
They have adjusted her medication and she is currently on olanzapine 15 mg and trelptal. She is really happy with her current regimen.

## 2023-03-11 NOTE — Progress Notes (Signed)
Established Patient Office Visit  Subjective   Patient ID: Tonya Clark, female    DOB: 1996/03/05  Age: 27 y.o. MRN: [id number]  Chief Complaint  Patient presents with   Hypertension   ifg    HPI  Impaired fasting glucose-no increased thirst or urination. No symptoms consistent with hypoglycemia.   She has been going to [employer] for weight management  -she has been following with them and doing really well she is also consulted with [facility] and is actually considering having surgery she just really wants to make sure that mentally she is in a good place to do it so she has not decided 1 where the other.  But she is seriously thinking about it.  You also wanted to discuss other treatment options for her keratosis Polarus.  Its on her upper outer arms and outer forearms and few spots on her abdomen as well.    ROS    Objective:     BP 126/76   Pulse (!) 104   Ht 5\' 6"  (1.676 m)   Wt (!) 313 lb (142 kg)   SpO2 98%   BMI 50.52 kg/m    Physical Exam Vitals and nursing note reviewed.  Constitutional:      Appearance: Normal appearance.  HENT:     Head: Normocephalic and atraumatic.  Eyes:     Conjunctiva/sclera: Conjunctivae normal.  Cardiovascular:     Rate and Rhythm: Normal rate and regular rhythm.  Pulmonary:     Effort: Pulmonary effort is normal.     Breath sounds: Normal breath sounds.  Skin:    General: Skin is warm and dry.  Neurological:     Mental Status: She is alert.  Psychiatric:        Mood and Affect: Mood normal.      Results for orders placed or performed in visit on 03/11/23  POCT HgB A1C  Result Value Ref Range   Hemoglobin A1C 5.3 4.0 - 5.6 %   HbA1c POC (<> result, manual entry)     HbA1c, POC (prediabetic range)     HbA1c, POC (controlled diabetic range)        The ASCVD Risk score (Arnett DK, et al., 2019) failed to calculate for the following reasons:   The 2019 ASCVD risk score is only valid for ages 72 to  55    Assessment & Plan:   Problem List Items Addressed This Visit       Cardiovascular and Mediastinum   Migraine headache    Currently having almost daily headaches.  She is tried Topamax in the past and is currently contraindicated with her current medication regimen.  She is already on a calcium channel blocker and blood pressure is well-controlled.  We discussed options.  She has done Ajovy in the past and did well with it.  It looks like Emgality is covered on her insurance so we will see if we can get that covered.      Relevant Medications   diltiazem (CARDIZEM CD) 240 MG 24 hr capsule   Galcanezumab-gnlm (EMGALITY) 120 MG/ML SOAJ   Essential hypertension    Well controlled. Continue current regimen. Follow up in  6mo       Relevant Medications   diltiazem (CARDIZEM CD) 240 MG 24 hr capsule     Endocrine   IFG (impaired fasting glucose)    A1C looks great today at 5.3 off of all medications.  Continue current regimen and plan to recheck again  in 6 to 12 months.      Relevant Orders   POCT HgB A1C (Completed)     Musculoskeletal and Integument   Keratosis pilaris - Primary    Applying the Cereve daily for a couple of months and then it started to cause her to break out in a rash.  When she stopped it it would go away and then it would come back when she would restart it.  She wants to know what other options there are for treatment we discussed that we can use topical retinoid but will have to advance use slowly.  Warned about potential for redness irritation itching and burning.  Start with 3 days/week.      Relevant Medications   tretinoin (RETIN-A) 0.025 % cream     Other   Bipolar 1 disorder (HCC)    They have adjusted her medication and she is currently on olanzapine 15 mg and trelptal. She is really happy with her current regimen.        Other Visit Diagnoses     Encounter for immunization       Relevant Orders   Flu vaccine trivalent PF, 6mos and  older(Flulaval,Afluria,Fluarix,Fluzone) (Completed)       Return in about 6 months (around 09/09/2023) for Hypertension.    Nani Gasser, MD

## 2023-03-11 NOTE — Assessment & Plan Note (Signed)
Well controlled. Continue current regimen. Follow up in  6 mo  

## 2023-03-11 NOTE — Assessment & Plan Note (Addendum)
A1C looks great today at 5.3 off of all medications.  Continue current regimen and plan to recheck again in 6 to 12 months.

## 2023-03-11 NOTE — Assessment & Plan Note (Signed)
Currently having almost daily headaches.  She is tried Topamax in the past and is currently contraindicated with her current medication regimen.  She is already on a calcium channel blocker and blood pressure is well-controlled.  We discussed options.  She has done Ajovy in the past and did well with it.  It looks like Emgality is covered on her insurance so we will see if we can get that covered.

## 2023-03-18 ENCOUNTER — Encounter: Payer: Self-pay | Admitting: Family Medicine

## 2023-03-18 NOTE — Telephone Encounter (Signed)
HI Key, do you know where we are at on this one?

## 2023-03-25 ENCOUNTER — Telehealth: Payer: Self-pay

## 2023-03-25 NOTE — Telephone Encounter (Addendum)
Initiated Prior authorization [id number] 120MG /ML auto-injectors (migraine) Via: Covermymeds Case/Key:[id number] Status: approved as of 03/25/23 Reason: Authorization Expiration Date: June 25, 2023. Notified Pt via: Mychart

## 2023-04-12 ENCOUNTER — Ambulatory Visit (INDEPENDENT_AMBULATORY_CARE_PROVIDER_SITE_OTHER): Payer: BC Managed Care – PPO | Admitting: Family Medicine

## 2023-04-12 ENCOUNTER — Encounter: Payer: Self-pay | Admitting: Family Medicine

## 2023-04-12 VITALS — BP 127/71 | HR 76 | Ht 66.0 in | Wt 293.0 lb

## 2023-04-12 DIAGNOSIS — Z0000 Encounter for general adult medical examination without abnormal findings: Secondary | ICD-10-CM | POA: Diagnosis not present

## 2023-04-12 NOTE — Progress Notes (Signed)
Complete physical exam  Patient: Tonya Clark   DOB: [birth year]   27 y.o. Female  MRN: [id number]  Subjective:    Chief Complaint  Patient presents with   Annual Exam    Jewels Kresge is a 27 y.o. female who presents today for a complete physical exam. She reports consuming a general diet.  Working out some.   She generally feels well. She reports sleeping poorly.  She has been stressed with the upcoming surgical procedure.  She does not have additional problems to discuss today.    Most recent fall risk assessment:    03/11/2023    4:04 PM  Fall Risk   Falls in the past year? 0  Number falls in past yr: 0  Injury with Fall? 0  Risk for fall due to : No Fall Risks  Follow up Falls evaluation completed     Most recent depression screenings:    03/11/2023    4:27 PM 12/20/2022    2:20 PM  PHQ 2/9 Scores  PHQ - 2 Score 2 2  PHQ- 9 Score 6 5        Patient Care Team: Agapito Games, MD as PCP - General (Family Medicine) Lenna Sciara, MD as PCP - Internal Medicine (Internal Medicine) [name], DO as PCP - Cardiology (Cardiology) Tanya Nones, OD (Optometry)   Outpatient Medications Prior to Visit  Medication Sig   diltiazem (CARDIZEM CD) 240 MG 24 hr capsule Take 1 capsule (240 mg total) by mouth daily. Pt needs office visit before future refills.   HAILEY FE 1.5/30 1.5-30 MG-MCG tablet TAKE 1 TABLET BY MOUTH EVERY DAY   montelukast (SINGULAIR) 10 MG tablet TAKE 1 TABLET BY MOUTH EVERY DAY   OLANZapine (ZYPREXA) 15 MG tablet Take 15 mg by mouth daily.   omeprazole (PRILOSEC) 40 MG capsule Take 20 mg by mouth every morning.   oxcarbazepine (TRILEPTAL) 600 MG tablet Take 600 mg by mouth 4 (four) times daily.   prazosin (MINIPRESS) 5 MG capsule Take 7 mg by mouth at bedtime.   tretinoin (RETIN-A) 0.025 % cream Apply topically at bedtime. Start 3 days per week and advance to daily as tolerated   VYVANSE 50 MG capsule Take 50 mg by mouth daily.    [DISCONTINUED] metFORMIN (GLUCOPHAGE) 1000 MG tablet Take 1,000 mg by mouth 2 (two) times daily with a meal.   No facility-administered medications prior to visit.    ROS        Objective:     BP 127/71   Pulse 76   Ht 5\' 6"  (1.676 m)   Wt 293 lb (132.9 kg)   SpO2 100%   BMI 47.29 kg/m    Physical Exam Vitals and nursing note reviewed.  Constitutional:      Appearance: Normal appearance.  HENT:     Head: Normocephalic and atraumatic.     Right Ear: Tympanic membrane, ear canal and external ear normal. There is no impacted cerumen.     Left Ear: Tympanic membrane, ear canal and external ear normal. There is no impacted cerumen.     Nose: Nose normal.     Mouth/Throat:     Pharynx: Oropharynx is clear.  Eyes:     Extraocular Movements: Extraocular movements intact.     Conjunctiva/sclera: Conjunctivae normal.     Pupils: Pupils are equal, round, and reactive to light.  Neck:     Thyroid: No thyromegaly.  Cardiovascular:     Rate and Rhythm:  Normal rate and regular rhythm.  Pulmonary:     Effort: Pulmonary effort is normal.     Breath sounds: Normal breath sounds.  Abdominal:     General: Bowel sounds are normal.     Palpations: Abdomen is soft.     Tenderness: There is no abdominal tenderness.  Musculoskeletal:        General: No swelling.     Cervical back: Neck supple. No tenderness.  Lymphadenopathy:     Cervical: No cervical adenopathy.  Skin:    General: Skin is warm and dry.  Neurological:     Mental Status: She is alert and oriented to person, place, and time.  Psychiatric:        Mood and Affect: Mood normal.        Behavior: Behavior normal.      No results found for any visits on 04/12/23.     Assessment & Plan:    Routine Health Maintenance and Physical Exam  Immunization History  Administered Date(s) Administered   DTaP 08/07/1996, 10/17/1996, 12/18/1996, 09/05/1997, 07/31/2001   HIB (PRP-OMP) 08/07/1996, 10/17/1996, 12/18/1996,  09/05/1997   HPV Quadrivalent 09/11/2007, 11/15/2007, 09/12/2008   Hepatitis A 09/08/2006, 09/11/2007   Hepatitis A, Ped/Adol-2 Dose 09/08/2006, 09/11/2007   Hepatitis B 12/31/96, 08/07/1996, 12/18/1996   Hepatitis B, PED/ADOLESCENT 07-19-96, 08/07/1996, 12/18/1996   IPV 08/07/1996, 10/17/1996, 09/05/1997, 07/31/2001   Influenza, Seasonal, Injecte, Preservative Fre 03/11/2023   Influenza,inj,Quad PF,6+ Mos 03/03/2017, 04/13/2018, 04/24/2019, 01/19/2021, 04/05/2022   MMR 09/05/1997, 07/31/2001   Meningococcal Conjugate 09/11/2007, 03/09/2013   Moderna Sars-Covid-2 Vaccination 07/26/2019, 09/01/2019   PPD Test 04/23/2019   Tdap 09/08/2006, 11/23/2016   Varicella 06/13/1997, 09/08/2006    Health Maintenance  Topic Date Due   Cervical Cancer Screening (Pap smear)  11/13/2022   Hepatitis C Screening  07/22/2023 (Originally 06/08/2014)   HIV Screening  07/22/2023 (Originally 06/09/2011)   DTaP/Tdap/Td (8 - Td or Tdap) 11/24/2026   INFLUENZA VACCINE  Completed   HPV VACCINES  Completed   Pneumococcal Vaccine 20-80 Years old  Aged Out   COVID-19 Vaccine  Discontinued    Discussed health benefits of physical activity, and encouraged her to engage in regular exercise appropriate for her age and condition.  Problem List Items Addressed This Visit   None Visit Diagnoses     Wellness examination    -  Primary       Keep up a regular exercise program and make sure you are eating a healthy diet Try to eat 4 servings of dairy a day, or if you are lactose intolerant take a calcium with vitamin D daily.  Your vaccines are up to date.  She is due for Pap smear but wants to wait until after she has her surgery she is having a gastric sleeve done on Monday.   Return in about 3 months (around 07/13/2023) for pap smear.    Flowsheet Row Office Visit from 03/11/2023 in [facility] at [facility]  PHQ-9 Total Score 6        Nani Gasser,  MD

## 2023-04-23 ENCOUNTER — Other Ambulatory Visit: Payer: Self-pay | Admitting: Family Medicine

## 2023-04-23 DIAGNOSIS — Z3009 Encounter for other general counseling and advice on contraception: Secondary | ICD-10-CM

## 2023-06-06 ENCOUNTER — Other Ambulatory Visit: Payer: Self-pay | Admitting: Family Medicine

## 2023-06-06 DIAGNOSIS — R7301 Impaired fasting glucose: Secondary | ICD-10-CM

## 2023-06-30 ENCOUNTER — Encounter: Payer: Self-pay | Admitting: Family Medicine

## 2023-06-30 DIAGNOSIS — J111 Influenza due to unidentified influenza virus with other respiratory manifestations: Secondary | ICD-10-CM

## 2023-06-30 MED ORDER — OSELTAMIVIR PHOSPHATE 75 MG PO CAPS: 75.0000 mg | ORAL_CAPSULE | Freq: Two times a day (BID) | ORAL | 0 refills | Status: DC

## 2023-06-30 NOTE — Telephone Encounter (Signed)
 Please see the MyChart message reply(ies) for my assessment and plan.    This patient gave consent for this Medical Advice Message and is aware that it may result in a bill to [employer], as well as the possibility of receiving a bill for a co-payment or deductible. They are an established patient, but are not seeking medical advice exclusively about a problem treated during an in person or video visit in the last seven days. I did not recommend an in person or video visit within seven days of my reply.    I spent a total of 5 minutes cumulative time within 7 days through [employer].  Dorothyann Byars, MD    Meds ordered this encounter  Medications   oseltamivir  (TAMIFLU ) 75 MG capsule    Sig: Take 1 capsule (75 mg total) by mouth 2 (two) times daily.    Dispense:  10 capsule    Refill:  0

## 2023-07-15 ENCOUNTER — Ambulatory Visit: Payer: BC Managed Care – PPO | Admitting: Family Medicine

## 2023-08-18 ENCOUNTER — Ambulatory Visit: Payer: BC Managed Care – PPO | Admitting: Family Medicine

## 2023-08-30 ENCOUNTER — Telehealth: Payer: Self-pay

## 2023-08-30 ENCOUNTER — Other Ambulatory Visit (HOSPITAL_COMMUNITY): Payer: Self-pay

## 2023-08-30 NOTE — Telephone Encounter (Signed)
 Pharmacy Patient Advocate Encounter   Received notification from Patient Pharmacy that prior authorization for Emgality is required/requested.   Insurance verification completed.   The patient is insured through [facility] ADVANTAGE/RX ADVANCE .   Per test claim: PA required; PA submitted to above mentioned insurance via CoverMyMeds Key/confirmation #/EOC [id number] Status is pending

## 2023-08-31 ENCOUNTER — Other Ambulatory Visit (HOSPITAL_COMMUNITY): Payer: Self-pay

## 2023-09-01 ENCOUNTER — Other Ambulatory Visit (HOSPITAL_COMMUNITY): Payer: Self-pay

## 2023-09-01 NOTE — Telephone Encounter (Signed)
 Pharmacy Patient Advocate Encounter  Received notification from [facility] that Prior Authorization for Emgality 120 has been APPROVED from 08/30/23 to 08/29/24 with QUANTITY LIMIT of 3 for a 75 day supply.   PA #/Case ID/Reference #: [id number] Unable to run test claim, but spoke with [facility] representative.

## 2023-09-01 NOTE — Telephone Encounter (Signed)
 Pt notified via My Chart

## 2023-09-08 ENCOUNTER — Other Ambulatory Visit (HOSPITAL_COMMUNITY): Payer: Self-pay

## 2023-09-08 ENCOUNTER — Telehealth: Payer: Self-pay

## 2023-09-08 ENCOUNTER — Ambulatory Visit (INDEPENDENT_AMBULATORY_CARE_PROVIDER_SITE_OTHER): Payer: Self-pay | Admitting: Family Medicine

## 2023-09-08 ENCOUNTER — Encounter: Payer: Self-pay | Admitting: Family Medicine

## 2023-09-08 VITALS — BP 129/78 | HR 82 | Ht 66.0 in | Wt 234.0 lb

## 2023-09-08 DIAGNOSIS — I10 Essential (primary) hypertension: Secondary | ICD-10-CM | POA: Diagnosis not present

## 2023-09-08 DIAGNOSIS — E538 Deficiency of other specified B group vitamins: Secondary | ICD-10-CM | POA: Diagnosis not present

## 2023-09-08 DIAGNOSIS — G43709 Chronic migraine without aura, not intractable, without status migrainosus: Secondary | ICD-10-CM

## 2023-09-08 DIAGNOSIS — Z9884 Bariatric surgery status: Secondary | ICD-10-CM

## 2023-09-08 MED ORDER — EMGALITY 120 MG/ML ~~LOC~~ SOSY: 120.0000 mg | PREFILLED_SYRINGE | SUBCUTANEOUS | 11 refills | Status: AC

## 2023-09-08 MED ORDER — RIZATRIPTAN BENZOATE 5 MG PO TABS: 5.0000 mg | ORAL_TABLET | Freq: Every day | ORAL | 1 refills | Status: DC | PRN

## 2023-09-08 NOTE — Progress Notes (Signed)
 Pt reports that she had to stop taking the Emgality 120 mg prior to her surgery and was advised that she would need to speak with Dr. Greer Leak before she restarted this. She also hasn't had any labs since she was last seen by Dr. Greer Leak.   She stated that her headaches have been a combination of daily headaches and migraine headaches.

## 2023-09-08 NOTE — Telephone Encounter (Signed)
 Pharmacy Patient Advocate Encounter   Received notification from Patient Pharmacy that prior authorization for Emgality 120 is required/requested.   Insurance verification completed.   The patient is insured through [facility] .   Per test claim: PA required; PA submitted to above mentioned insurance via CoverMyMeds Key/confirmation #/EOC [id number] Status is pending

## 2023-09-08 NOTE — Assessment & Plan Note (Signed)
Due to recheck B12

## 2023-09-08 NOTE — Assessment & Plan Note (Signed)
 I had any labs since surgery so we will get some updated lab work today.

## 2023-09-08 NOTE — Assessment & Plan Note (Signed)
 Well controlled. Continue current regimen. Follow up in  6 mo

## 2023-09-08 NOTE — Assessment & Plan Note (Addendum)
 Go ahead and restart the Emgality she has the 2 starter pens for the first dose and then will need to get a prior authorization with her new insurance to continue the medication.  Will do a trial with Maxalt I do not believe she has tried that 1 before.  Continue to avoid NSAIDs.

## 2023-09-08 NOTE — Progress Notes (Signed)
 Established Patient Office Visit  Subjective  Patient ID: Tonya Clark, female    DOB: 1995/12/05  Age: 28 y.o. MRN: [id number]  Chief Complaint  Patient presents with   Headache    HPI  She is here for follow-up migraines.  We were able to get Emgality improved with her prior insurance last fall but it was really close to when she was going to have bariatric surgery so they encouraged her not to start it at that time and to wait until after surgery.  She is actually done really fantastic and is lost over 100 pounds at this point.  But unfortunately her migraines for the last several months have actually been ramping back up.  She is getting at least 1 migraine per week and having a more tension type or low-grade headache at least 4 out of the 6 days/week.  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  Now that she has had bariatric surgery she cannot use NSAIDs for relief and has only been able to rely on Tylenol and that does not work for her migraines or headaches.  In the past she has tried Tylenol, Excedrin, Topamax, Ajovy, metoprolol, and sumatriptan.  And now she cannot use NSAIDs.    ROS    Objective:     BP 129/78   Pulse 82   Ht 5\' 6"  (1.676 m)   Wt 234 lb (106.1 kg)   SpO2 100%   BMI 37.77 kg/m    Physical Exam Vitals and nursing note reviewed.  Constitutional:      Appearance: Normal appearance.  HENT:     Head: Normocephalic and atraumatic.  Eyes:     Conjunctiva/sclera: Conjunctivae normal.  Cardiovascular:     Rate and Rhythm: Normal rate and regular rhythm.  Pulmonary:     Effort: Pulmonary effort is normal.     Breath sounds: Normal breath sounds.  Skin:    General: Skin is warm and dry.  Neurological:     Mental Status: She is alert.  Psychiatric:        Mood and Affect: Mood normal.      No results found for any visits on  09/08/23.    The ASCVD Risk score (Arnett DK, et al., 2019) failed to calculate for the following reasons:   The 2019 ASCVD risk score is only valid for ages 86 to 72    Assessment & Plan:   Problem List Items Addressed This Visit       Cardiovascular and Mediastinum   Migraine headache - Primary   Go ahead and restart the Emgality she has the 2 starter pens for the first dose and then will need to get a prior authorization with her new insurance to continue the medication.  Will do a trial with Maxalt I do not believe she has tried that 1 before.  Continue to avoid NSAIDs.      Relevant Medications   Galcanezumab-gnlm (EMGALITY) 120 MG/ML SOSY   rizatriptan (MAXALT) 5 MG tablet   Other Relevant Orders   CMP14+EGFR   Lipid panel   CBC   Magnesium   B12   Hemoglobin A1c   Vitamin B1   Vitamin B6   VITAMIN D 25 Hydroxy (Vit-D Deficiency, Fractures)   Essential hypertension   Well controlled. Continue current regimen. Follow up in  26mo  Other   S/P bariatric surgery   I had any labs since surgery so we will get some updated lab work today.      Relevant Orders   CMP14+EGFR   Lipid panel   CBC   Magnesium   B12   Hemoglobin A1c   Vitamin B1   Vitamin B6   VITAMIN D 25 Hydroxy (Vit-D Deficiency, Fractures)   B12 deficiency   Due to recheck B12       Relevant Orders   CMP14+EGFR   Lipid panel   CBC   Magnesium   B12   Hemoglobin A1c   Vitamin B1   Vitamin B6   VITAMIN D 25 Hydroxy (Vit-D Deficiency, Fractures)    Return in about 3 months (around 12/08/2023) for New start medication/Migraines.     Duaine German, MD

## 2023-09-09 ENCOUNTER — Other Ambulatory Visit (HOSPITAL_COMMUNITY): Payer: Self-pay

## 2023-09-09 NOTE — Telephone Encounter (Signed)
 Pharmacy Patient Advocate Encounter  Received notification from [facility] that Prior Authorization for Emgality  120 has been APPROVED from 09/09/23 to 09/08/24. Ran test claim, Copay is $30.00. This test claim was processed through [facility]- copay amounts may vary at other pharmacies due to pharmacy/plan contracts, or as the patient moves through the different stages of their insurance plan.   PA #/Case ID/Reference #: [id number]

## 2023-09-12 ENCOUNTER — Encounter: Payer: Self-pay | Admitting: Family Medicine

## 2023-09-12 LAB — CBC
Hematocrit: 41 % (ref 34.0–46.6)
Hemoglobin: 13 g/dL (ref 11.1–15.9)
MCH: 26.4 pg — ABNORMAL LOW (ref 26.6–33.0)
MCHC: 31.7 g/dL (ref 31.5–35.7)
MCV: 83 fL (ref 79–97)
Platelets: 324 10*3/uL (ref 150–450)
RBC: 4.93 x10E6/uL (ref 3.77–5.28)
RDW: 14.4 % (ref 11.7–15.4)
WBC: 9 10*3/uL (ref 3.4–10.8)

## 2023-09-12 LAB — HEMOGLOBIN A1C
Est. average glucose Bld gHb Est-mCnc: 103 mg/dL
Hgb A1c MFr Bld: 5.2 % (ref 4.8–5.6)

## 2023-09-12 LAB — CMP14+EGFR
ALT: 16 IU/L (ref 0–32)
AST: 12 IU/L (ref 0–40)
Albumin: 4.2 g/dL (ref 4.0–5.0)
Alkaline Phosphatase: 99 IU/L (ref 44–121)
BUN/Creatinine Ratio: 18 (ref 9–23)
BUN: 16 mg/dL (ref 6–20)
Bilirubin Total: <0.2 mg/dL (ref 0.0–1.2)
CO2: 23 mmol/L (ref 20–29)
Calcium: 9.4 mg/dL (ref 8.7–10.2)
Chloride: 101 mmol/L (ref 96–106)
Creatinine, Ser: 0.87 mg/dL (ref 0.57–1.00)
Globulin, Total: 2.8 g/dL (ref 1.5–4.5)
Glucose: 91 mg/dL (ref 70–99)
Potassium: 4.5 mmol/L (ref 3.5–5.2)
Sodium: 138 mmol/L (ref 134–144)
Total Protein: 7 g/dL (ref 6.0–8.5)
eGFR: 94 mL/min/{1.73_m2} (ref >59)

## 2023-09-12 LAB — LIPID PANEL
Chol/HDL Ratio: 4.2 ratio (ref 0.0–4.4)
Cholesterol, Total: 204 mg/dL — ABNORMAL HIGH (ref 100–199)
HDL: 49 mg/dL (ref >39)
LDL Chol Calc (NIH): 132 mg/dL — ABNORMAL HIGH (ref 0–99)
Triglycerides: 129 mg/dL (ref 0–149)
VLDL Cholesterol Cal: 23 mg/dL (ref 5–40)

## 2023-09-12 LAB — VITAMIN B6: Vitamin B6: 5.7 ug/L (ref 3.4–65.2)

## 2023-09-12 LAB — VITAMIN B12: Vitamin B-12: 307 pg/mL (ref 232–1245)

## 2023-09-12 LAB — VITAMIN B1: Thiamine: 95.5 nmol/L (ref 66.5–200.0)

## 2023-09-12 LAB — VITAMIN D 25 HYDROXY (VIT D DEFICIENCY, FRACTURES): Vit D, 25-Hydroxy: 48.1 ng/mL (ref 30.0–100.0)

## 2023-09-12 LAB — MAGNESIUM: Magnesium: 2 mg/dL (ref 1.6–2.3)

## 2023-09-12 NOTE — Progress Notes (Signed)
 LDL close overall is just a little elevated at 132 goal is under 100 continue to work on healthy diet and regular exercise.  Hemoglobin looks good no sign of anemia.  Metabolic panel including liver and kidney function is great.  Magnesium looks great.  Vitamin B12 is in the normal range.  Make sure you are still taking your daily multivitamin.  A1c looks phenomenal no sign of diabetes or prediabetes.  Vitamin B1 is normal.  B6 is normal but on the lower end so again just make sure taking a good multivitamin.  Vitamin D  looks great.

## 2023-09-26 ENCOUNTER — Telehealth: Admitting: Nurse Practitioner

## 2023-09-26 DIAGNOSIS — M542 Cervicalgia: Secondary | ICD-10-CM | POA: Diagnosis not present

## 2023-09-26 MED ORDER — CYCLOBENZAPRINE HCL 10 MG PO TABS: 10.0000 mg | ORAL_TABLET | Freq: Three times a day (TID) | ORAL | 0 refills | Status: DC | PRN

## 2023-09-26 NOTE — Progress Notes (Signed)
 Virtual Visit Consent   Tonya Clark, you are scheduled for a virtual visit with a [facility] provider today. Just as with appointments in the office, your consent must be obtained to participate. Your consent will be active for this visit and any virtual visit you may have with one of our providers in the next 365 days. If you have a MyChart account, a copy of this consent can be sent to you electronically.  As this is a virtual visit, video technology does not allow for your provider to perform a traditional examination. This may limit your provider's ability to fully assess your condition. If your provider identifies any concerns that need to be evaluated in person or the need to arrange testing (such as labs, EKG, etc.), we will make arrangements to do so. Although advances in technology are sophisticated, we cannot ensure that it will always work on either your end or our end. If the connection with a video visit is poor, the visit may have to be switched to a telephone visit. With either a video or telephone visit, we are not always able to ensure that we have a secure connection.  By engaging in this virtual visit, you consent to the provision of healthcare and authorize for your insurance to be billed (if applicable) for the services provided during this visit. Depending on your insurance coverage, you may receive a charge related to this service.  I need to obtain your verbal consent now. Are you willing to proceed with your visit today? Meha Goon has provided verbal consent on 09/26/2023 for a virtual visit (video or telephone). Mardene Shake, FNP  Date: 09/26/2023 5:36 PM   Virtual Visit via Video Note   I, Mardene Shake, connected with  Tonya Clark  ([id number], 27-Dec-1995) on 09/26/23 at  5:45 PM EDT by a video-enabled telemedicine application and verified that I am speaking with the correct person using two identifiers.  Location: Patient: Virtual Visit Location Patient:  Home Provider: Virtual Visit Location Provider: Home Office   I discussed the limitations of evaluation and management by telemedicine and the availability of in person appointments. The patient expressed understanding and agreed to proceed.    History of Present Illness: Tonya Clark is a 28 y.o. who identifies as a female who was assigned female at birth, and is being seen today for  She fell down the stairs yesterday, she did not hit her head- she did not lose consciousness  She landed on her buttock denies any pain with sitting today   She has had a headaches since that time with body aches She has full ROM with neck   Her legs and arms feels normal no change in sensation or strength   Today she has noticed she is sore is her right lower back and feels like her headache is coming from jerking her neck when she fell   Significant PMH includes gastric Sx   Problems:  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   Keratosis pilaris 03/11/2023   Sleep walking 04/05/2022   OSA (obstructive sleep apnea) 02/27/2021   IFG (impaired fasting glucose) 01/19/2021   Snoring 12/23/2020   Excessive daytime sleepiness 12/23/2020   Fibromyalgia 12/01/2020   Sinus tachycardia 10/27/2020   Morbid obesity (HCC) 10/27/2020   Essential hypertension 10/24/2020   B12 deficiency 10/24/2020   Arrhythmia    Kidney stone    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   Inflammatory polyarthritis (HCC)  09/29/2020   Migraine headache 01/04/2019   New daily persistent headache 11/25/2017   Moderate right ankle sprain 10/15/2016   PAC (premature atrial contraction) 10/09/2015   PVC (premature ventricular contraction) 10/09/2015   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    Medications:  Current Outpatient Medications:    diltiazem  (CARDIZEM  CD) 240 MG 24 hr capsule, Take 1 capsule (240 mg total) by mouth daily. Pt needs office visit before future refills., Disp: 90 capsule, Rfl: 3   Galcanezumab -gnlm (EMGALITY ) 120 MG/ML SOSY, Inject 120 mg into the skin every 30 (thirty) days., Disp: 1.12 mL, Rfl: 11   HAILEY FE 1.5/30 1.5-30 MG-MCG tablet, TAKE 1 TABLET BY MOUTH DAILY. TAKE CONTINUOUSLY. SKIP PLACEBOS., Disp: 84 tablet, Rfl: 2   montelukast (SINGULAIR) 10 MG tablet, TAKE 1 TABLET BY MOUTH EVERY DAY, Disp: 90 tablet, Rfl: 3   OLANZapine (ZYPREXA) 15 MG tablet, Take 15 mg by mouth daily., Disp: , Rfl:    omeprazole (PRILOSEC) 40 MG capsule, Take 20 mg by mouth every morning., Disp: , Rfl:    oxcarbazepine  (TRILEPTAL ) 600 MG tablet, Take 600 mg by mouth 4 (four) times daily., Disp: , Rfl:    prazosin (MINIPRESS) 5 MG capsule, Take 7 mg by mouth at bedtime., Disp: , Rfl:    rizatriptan  (MAXALT ) 5 MG tablet, Take 1 tablet (5 mg total) by mouth daily as needed for migraine. May repeat in 2 hours if needed, Disp: 10 tablet, Rfl: 1   tretinoin  (RETIN-A ) 0.025 % cream, Apply topically at bedtime. Start 3 days per week and advance to daily as tolerated, Disp: 45 g, Rfl: 1   VYVANSE 50 MG capsule, Take 50 mg by mouth daily., Disp: , Rfl:   Observations/Objective: Patient is well-developed, well-nourished in no acute distress.  Resting comfortably  at home.  Head is normocephalic, atraumatic.  No labored breathing.  Speech is clear and coherent with logical content.  Patient is alert and  oriented at baseline.    Assessment and Plan:  1. Neck pain Epson salt soaks  Tylenol  as needed for pain   Meds ordered this encounter  Medications   cyclobenzaprine  (FLEXERIL ) 10 MG tablet    Sig: Take 1 tablet (10 mg total) by mouth 3 (three) times daily as needed for muscle spasms.    Dispense:  30 tablet    Refill:  0    Follow Up Instructions: I discussed the assessment and treatment plan with the patient. The patient was provided an opportunity to ask questions and all were answered. The patient agreed with the plan and demonstrated an understanding of the instructions.  A copy of instructions were sent to the patient via MyChart unless otherwise noted below.    The patient was advised to call back or seek an in-person evaluation if the symptoms worsen or if the condition fails to improve as anticipated.  Mardene Shake, FNP

## 2023-10-04 ENCOUNTER — Encounter: Payer: Self-pay | Admitting: Family Medicine

## 2023-10-05 NOTE — Telephone Encounter (Signed)
 Can come in for toradl and phenergan  shot if have someone to drive her, or just Toradol  if she has to drive herself.

## 2023-10-05 NOTE — Telephone Encounter (Signed)
 Spoke with patient. Migraine has improved today. She does not need injection.

## 2023-10-05 NOTE — Telephone Encounter (Signed)
I called and left a message for a return call.  

## 2023-10-10 ENCOUNTER — Ambulatory Visit: Payer: Self-pay

## 2023-10-10 NOTE — Telephone Encounter (Signed)
  Chief Complaint: migraine Symptoms: headache x 2 days  Frequency: chronic migraine Pertinent Negatives: Patient denies fever/sinus congestion Disposition: [] ED /[] Urgent Care (no appt availability in office) / [] Appointment(In office/virtual)/ []  [facility]/ [x] Home Care/ [] Refused Recommended Disposition /[] [facility]/ []  Follow-up with PCP Additional Notes: Please call patient regarding prescribing additional medication for migraine.  Current medication, rizatriptan , not managing migraine pain,  emgality  injection delayed due to med being out of stock at pharmacy Copied from CRM [id number]. Topic: Clinical - Medication Question >> Oct 10, 2023  2:48 PM Lenon Radar A wrote: Reason for CRM: Patient called in regarding migraine and wants to know if there was anything else that she can take besides rizatriptan  (MAXALT ) 5 MG tablet. Patient stated it is not helping and that she needs something different. Please consult with patient via MyChart or [phone]. Reason for Disposition  [1] MILD-MODERATE headache AND [2] present > 72 hours  Answer Assessment - Initial Assessment Questions 1. LOCATION: "Where does it hurt?"      Top and both sides of head (headband) 2. ONSET: "When did the headache start?" (Minutes, hours or days)      4 hours ago 3. PATTERN: "Does the pain come and go, or has it been constant since it started?"     constant 4. SEVERITY: "How bad is the pain?" and "What does it keep you from doing?"  (e.g., Scale 1-10; mild, moderate, or severe)   - MILD (1-3): doesn't interfere with normal activities    - MODERATE (4-7): interferes with normal activities or awakens from sleep    - SEVERE (8-10): excruciating pain, unable to do any normal activities        4 5. RECURRENT SYMPTOM: "Have you ever had headaches before?" If Yes, ask: "When was the last time?" and "What happened that time?"      Migraines, lately they are lasting longer and hurting more 6. CAUSE:  "What do you think is causing the headache?"     unknown 7. MIGRAINE: "Have you been diagnosed with migraine headaches?" If Yes, ask: "Is this headache similar?"      Yes. This head ache is similar 8. HEAD INJURY: "Has there been any recent injury to the head?"      no 9. OTHER SYMPTOMS: "Do you have any other symptoms?" (fever, stiff neck, eye pain, sore throat, cold symptoms)     no 10. PREGNANCY: "Is there any chance you are pregnant?" "When was your last menstrual period?"       no  Protocols used: Headache-A-AH

## 2023-10-11 ENCOUNTER — Encounter: Payer: Self-pay | Admitting: Medical-Surgical

## 2023-10-11 ENCOUNTER — Ambulatory Visit (INDEPENDENT_AMBULATORY_CARE_PROVIDER_SITE_OTHER): Admitting: Medical-Surgical

## 2023-10-11 VITALS — BP 113/76 | HR 95 | Resp 20 | Ht 66.0 in | Wt 234.0 lb

## 2023-10-11 DIAGNOSIS — G43009 Migraine without aura, not intractable, without status migrainosus: Secondary | ICD-10-CM

## 2023-10-11 MED ORDER — DEXAMETHASONE SODIUM PHOSPHATE 10 MG/ML IJ SOLN
10.0000 mg | Freq: Once | INTRAMUSCULAR | Status: AC
  Administered 2023-10-11: 10 mg via INTRAMUSCULAR

## 2023-10-11 MED ORDER — KETOROLAC TROMETHAMINE 30 MG/ML IJ SOLN
30.0000 mg | Freq: Once | INTRAMUSCULAR | Status: AC
  Administered 2023-10-11: 30 mg via INTRAMUSCULAR

## 2023-10-11 MED ORDER — RIZATRIPTAN BENZOATE 10 MG PO TBDP: 10.0000 mg | ORAL_TABLET | ORAL | 3 refills | Status: DC | PRN

## 2023-10-11 NOTE — Telephone Encounter (Signed)
 Of 10 mg of Maxalt  listed on her med list is she taking 5?  Just wanted to clarify.  We could always have her come in for Toradol  and Phenergan  injection as well.

## 2023-10-11 NOTE — Progress Notes (Signed)
        Established patient visit  History, exam, impression, and plan:  1. Migraine without aura and without status migrainosus, not intractable (Primary) Pleasant 28 year old female presenting today with an acute migraine that she felt coming on around 9 AM today.  She has been having very frequent headaches/migraines that she is working with her PCP on managing.  She restarted Emgality  and had her starting dose about 1 month ago.  She is due for her second dose and will be picking that up at the pharmacy today.  She is taking rizatriptan  5 mg daily as needed but notes that the medication does not do enough to help relieve her symptoms.  Reports that her headache moves location depending on the day and can be bandlike around the forehead, the sides and top of her head, or even the back of her head.  Is a bit light sensitive but denies significant phonophobia, nausea, and vomiting.  Reports that the headaches are interfering with her ability to function every day and she is understandably frustrated.  Has taken Imitrex  and Ajovy  in the past.  Also notes taking gabapentin , Topamax , and metoprolol  as preventative medications.  Not interested in any medications that would alter her mood as a preventative option due to her schizoaffective disorder.  Discussed various options.  Would like her to continue Emgality  with her next shot done as soon as possible.  Increasing rizatriptan  to 10 mg daily as needed.  Toradol  and Decadron  given in office today x 1.  Monitor symptoms and if rizatriptan  is not helpful or headaches continue to worsen, follow-up with PCP for further evaluation. - ketorolac  (TORADOL ) 30 MG/ML injection 30 mg - dexamethasone  (DECADRON ) injection 10 mg   Procedures performed this visit: None.  Return if symptoms worsen or fail to improve.  __________________________________ Tonya Snook, DNP, APRN, FNP-BC Primary Care and Sports Medicine [facility]

## 2023-10-12 NOTE — Telephone Encounter (Signed)
 Patient seen yesterday.

## 2023-11-23 ENCOUNTER — Ambulatory Visit: Payer: Self-pay

## 2023-11-23 NOTE — Telephone Encounter (Signed)
 FYI Only or Action Required?: FYI only for provider.  Patient was last seen in primary care on 10/11/2023 by Tonya Mini, NP. Called Nurse Triage reporting Rectal Pain. Symptoms began about a month ago. Interventions attempted: OTC medications: Colace BID and started Miralax today. Symptoms are: unchanged.  Triage Disposition: See Physician Within 24 Hours  Patient/caregiver understands and will follow disposition?: Yes  Pt states she has pain and burning after having BM, Bms are hard and painful when passing. Bleeding was worse today after BM then has been previously. Scheduled appt tomorrow 1130 with PCP. Advised care advice and recommended pt take another dose of Miralax tonight. Also can try Prep H if needed. Pt verbalized understanding.   Copied from CRM [id number]. Topic: Clinical - Red Word Triage >> Nov 23, 2023 11:45 AM Susanna ORN wrote: Red Word that prompted transfer to Nurse Triage: Patient states she's had a lot of blood when having a bowel movement that she says has been ongoing for a while now. States she has a lot of pain when sitting and it hurts the most during sitting. Was bleeding this morning but it has stopped for now. No fever or other symptoms. Reason for Disposition  MODERATE-SEVERE rectal pain (i.e., interferes with school, work, or sleep)  Answer Assessment - Initial Assessment Questions 1. SYMPTOM:  What's the main symptom you're concerned about? (e.g., pain, itching, swelling, rash)     Pain and bleeding  2. ONSET: When did the sx  start?     1 month  3. RECTAL PAIN: Do you have any pain around your rectum? How bad is the pain?  (Scale 0-10; or mild, moderate, severe)   - NONE (0): no pain   - MILD (1-3): doesn't interfere with normal activities    - MODERATE (4-7): interferes with normal activities or awakens from sleep, limping    - SEVERE (8-10): excruciating pain, unable to have a bowel movement      Sitting 6/10, standing 4/10 4. RECTAL ITCHING: Do you  have any itching in this area? How bad is the itching?  (Scale 0-10; or mild, moderate, severe)   - NONE: no itching   - MILD: doesn't interfere with normal activities    - MODERATE-SEVERE: interferes with normal activities or awakens from sleep     no 5. CONSTIPATION: Do you have constipation? If Yes, ask: How bad is it?     yes 7. OTHER SYMPTOMS: Do you have any other symptoms?  (e.g., abdomen pain, fever, rectal bleeding, vomiting)     Bleeding when having BM and goes every few days, LBM 11/23/23  Protocols used: Rectal Symptoms-A-AH

## 2023-11-23 NOTE — Telephone Encounter (Signed)
 Patient scheduled for 11/24/2023 with Dr. alvan

## 2023-11-24 ENCOUNTER — Encounter: Payer: Self-pay | Admitting: Family Medicine

## 2023-11-24 ENCOUNTER — Ambulatory Visit (INDEPENDENT_AMBULATORY_CARE_PROVIDER_SITE_OTHER): Admitting: Family Medicine

## 2023-11-24 VITALS — BP 131/67 | HR 96 | Ht 66.0 in | Wt 230.8 lb

## 2023-11-24 DIAGNOSIS — K625 Hemorrhage of anus and rectum: Secondary | ICD-10-CM | POA: Diagnosis not present

## 2023-11-24 MED ORDER — LIDOCAINE (ANORECTAL) 5 % EX GEL: 1.0000 | Freq: Two times a day (BID) | CUTANEOUS | 2 refills | Status: DC | PRN

## 2023-11-24 MED ORDER — HYDROCORTISONE ACETATE 25 MG RE SUPP: 25.0000 mg | Freq: Two times a day (BID) | RECTAL | 0 refills | Status: DC

## 2023-11-24 NOTE — Patient Instructions (Signed)
 Can use 1 capful of MiraLAX mixed with 16 ounces of water daily until you have a really soft bowel movement and then okay to decrease down to every other day.  And then can taper back and adjust depending on frequency and softness of stools.  Keep up the fiber supplement.  Make sure you are drinking plenty of water with that.  If you are not feeling better after a week and noticing decreased pain and bleeding then please let me know.

## 2023-11-24 NOTE — Progress Notes (Signed)
 Acute Office Visit  Subjective:     Patient ID: Tonya Clark, female    DOB: 09-10-1995, 28 y.o.   MRN: [id number]  Chief Complaint  Patient presents with   Rectal Bleeding    X1 month last noticed yesterday bright red   Rectal Pain    X2 months    HPI Patient is in today for rectal bleeding and pain.  She says the discomfort is actually been going on for about pretty persistently for 2 months though it really started after she had her bariatric surgery in November.  She takes fiber supplements 3 times a day and has done that regularly for some time.  She did start using a stool softener but she has had some bright red rectal bleeding for about the last month.  She just switched to MiraLAX yesterday.  She says now it is just so painful that she wants to cry when she has a bowel movement.  She used to go pretty regularly but since her surgery she goes every few days.    ROS      Objective:    BP 131/67   Pulse 96   Ht 5' 6 (1.676 m)   Wt 230 lb 12.8 oz (104.7 kg)   SpO2 99%   BMI 37.25 kg/m    Physical Exam Genitourinary:    Rectum: Normal.     Comments: She does have a tear at the 12 o'clock position no active bleeding at the moment.  No hemorrhoids.  We did not do a speculum exam.  Digital rectal exam negative for any swelling or mass.    No results found for any visits on 11/24/23.      Assessment & Plan:   Problem List Items Addressed This Visit   None Visit Diagnoses       Rectal bleeding    -  Primary   Relevant Medications   hydrocortisone (ANUSOL-HC) 25 MG suppository   Lidocaine, Anorectal, 5 % GEL      We discussed treatment with 1 getting her stools more soft she just ordered MiraLAX yesterday so gave her some instructions around that.  In the short-term recommend lidocaine gel to numb the area and make things more comfortable.  Also recommend starting hydrocortisone suppositories twice a day for the next couple of weeks.  If she is not  noticing improvement in pain and decreased bleeding with bowel movements then please let me know and refer to GI at that point.   Can use 1 capful of MiraLAX mixed with 16 ounces of water daily until you have a really soft bowel movement and then okay to decrease down to every other day.  And then can taper back and adjust depending on frequency and softness of stools.  Keep up the fiber supplement.  Make sure you are drinking plenty of water with that.  If you are not feeling better after a week and noticing decreased pain and bleeding then please let me know.  Meds ordered this encounter  Medications   hydrocortisone (ANUSOL-HC) 25 MG suppository    Sig: Place 1 suppository (25 mg total) rectally 2 (two) times daily.    Dispense:  24 suppository    Refill:  0   Lidocaine, Anorectal, 5 % GEL    Sig: Apply 1 Application topically 2 (two) times daily as needed.    Dispense:  30 g    Refill:  2    No follow-ups on file.  Dorothyann Byars, MD

## 2023-12-08 ENCOUNTER — Encounter: Payer: Self-pay | Admitting: Family Medicine

## 2023-12-08 ENCOUNTER — Ambulatory Visit (INDEPENDENT_AMBULATORY_CARE_PROVIDER_SITE_OTHER): Admitting: Family Medicine

## 2023-12-08 VITALS — BP 130/68 | HR 104 | Ht 66.0 in | Wt 226.0 lb

## 2023-12-08 DIAGNOSIS — G43709 Chronic migraine without aura, not intractable, without status migrainosus: Secondary | ICD-10-CM

## 2023-12-08 DIAGNOSIS — I10 Essential (primary) hypertension: Secondary | ICD-10-CM | POA: Diagnosis not present

## 2023-12-08 MED ORDER — DILTIAZEM HCL ER COATED BEADS 240 MG PO CP24: 240.0000 mg | ORAL_CAPSULE | Freq: Every day | ORAL | 3 refills | Status: DC

## 2023-12-08 NOTE — Assessment & Plan Note (Signed)
 Doing well with Emgality  thus far her prior authorization should be hopefully good for a year we will make sure there is adequate refills on file also doing great with her new rescue medicine rizatriptan  so we will make sure that she has refills on that as well otherwise doing great plan to follow-up in 6 months.

## 2023-12-08 NOTE — Assessment & Plan Note (Signed)
 Blood pressure looks good today.  Continue current regimen.

## 2023-12-08 NOTE — Progress Notes (Signed)
   Established Patient Office Visit  Subjective  Patient ID: Tonya Clark, female    DOB: [birth year]  Age: 28 y.o. MRN: [id number]  Chief Complaint  Patient presents with   Medical Management of Chronic Issues    HPI  Here for follow-up of migraine headaches-  we had recently started Emgality .  We did get insurance approval and her co-pay is $30 which is fantastic.  She says she is doing really well.  The last couple of days of the month right before her next injection she will have a few more headaches but they are not migraines they are more like tension type headaches and mild and she you can usually work through them she has had a chance to try the rizatriptan  and says so far she really likes it better than the Imitrex .  Mentally seems effective.  Follow-up from recent visit for rectal bleeding.  The MiraLAX has been super helpful and has been working well infection never ended up needing to use the hydrocortisone  suppositories etc. so we are can go ahead and take this off her med list.  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    ROS    Objective:     BP 130/68   Pulse (!) 104   Ht 5' 6 (1.676 m)   Wt 226 lb 0.6 oz (102.5 kg)   SpO2 98%   BMI 36.48 kg/m    Physical Exam Vitals and nursing note reviewed.  Constitutional:      Appearance: Normal appearance.  HENT:     Head: Normocephalic and atraumatic.  Eyes:     Conjunctiva/sclera: Conjunctivae normal.  Cardiovascular:     Rate and Rhythm: Normal rate and regular rhythm.  Pulmonary:     Effort: Pulmonary effort is normal.     Breath sounds: Normal breath sounds.  Skin:    General: Skin is warm and dry.  Neurological:     Mental Status: She is alert.  Psychiatric:        Mood and Affect: Mood normal.      No results found for any visits on 12/08/23.    The ASCVD Risk score (Arnett DK, et al., 2019) failed to calculate for the following reasons:    The 2019 ASCVD risk score is only valid for ages 28 to 59    Assessment & Plan:   Problem List Items Addressed This Visit       Cardiovascular and Mediastinum   Migraine headache - Primary   Doing well with Emgality  thus far her prior authorization should be hopefully good for a year we will make sure there is adequate refills on file also doing great with her new rescue medicine rizatriptan  so we will make sure that she has refills on that as well otherwise doing great plan to follow-up in 6 months.      Relevant Medications   diltiazem  (CARDIZEM  CD) 240 MG 24 hr capsule   Essential hypertension   Blood pressure looks good today.  Continue current regimen.      Relevant Medications   diltiazem  (CARDIZEM  CD) 240 MG 24 hr capsule    Return in about 6 months (around 06/09/2024) for Hypertension, Migarines .    Dorothyann Byars, MD

## 2023-12-29 ENCOUNTER — Encounter: Payer: Self-pay | Admitting: Family Medicine

## 2023-12-29 NOTE — Telephone Encounter (Signed)
 Patient scheduled for 3pm tomorrow 12/30/23 with Benton crain

## 2023-12-30 ENCOUNTER — Ambulatory Visit (INDEPENDENT_AMBULATORY_CARE_PROVIDER_SITE_OTHER): Admitting: Urgent Care

## 2023-12-30 ENCOUNTER — Encounter: Payer: Self-pay | Admitting: Urgent Care

## 2023-12-30 VITALS — BP 125/78 | HR 94 | Resp 17 | Ht 66.0 in | Wt 228.8 lb

## 2023-12-30 DIAGNOSIS — S0123XA Puncture wound without foreign body of nose, initial encounter: Secondary | ICD-10-CM | POA: Diagnosis not present

## 2023-12-30 DIAGNOSIS — L089 Local infection of the skin and subcutaneous tissue, unspecified: Secondary | ICD-10-CM | POA: Diagnosis not present

## 2023-12-30 MED ORDER — MUPIROCIN 2 % EX OINT: TOPICAL_OINTMENT | CUTANEOUS | 3 refills | Status: DC

## 2023-12-30 MED ORDER — CLINDAMYCIN PHOSPHATE 1 % EX SWAB: 1.0000 "application " | Freq: Two times a day (BID) | CUTANEOUS | 0 refills | Status: DC

## 2023-12-30 NOTE — Progress Notes (Signed)
 Established Patient Office Visit  Subjective:  Patient ID: Tonya Clark, female    DOB: March 14, 1996  Age: 28 y.o. MRN: [id number]  Chief Complaint  Patient presents with   Medical Management of Chronic Issues    Pt noticed a small red bump on the right side of her nose shortly after getting it pierced on 11/04/23 also notices it gets bigger sometime with some drainage    HPI  Discussed the use of AI scribe software for clinical note transcription with the patient, who gave verbal consent to proceed.  History of Present Illness   Tonya Clark is a 28 year old female who presents with a persistent pustule behind her nose piercing.  She got a nose piercing on June 13th, and about a week or two later, she noticed a pustule forming behind it. Initially, she thought it was a pimple and attempted to treat it with pimple patches, saline, face wash, regular soap, and warm compresses, but it has persisted.  The pustule is painful but not itchy, located externally behind the piercing. There is a small bump inside the nostril on the opposite side, but it does not cause any discomfort. She has a history of itchy reactions to certain metals in ear piercings, but this current issue is not associated with itchiness.  Her glasses sometimes rub against the area. She recently got new glasses that sit higher and do not rub as much. Her dog damaged her previous pair of glasses, necessitating the new ones.  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   Keratosis pilaris 03/11/2023   Sleep walking 04/05/2022   OSA (obstructive sleep apnea) 02/27/2021   IFG (impaired fasting glucose) 01/19/2021   Snoring 12/23/2020   Excessive daytime sleepiness 12/23/2020   Fibromyalgia 12/01/2020   Sinus tachycardia  10/27/2020   Morbid obesity (HCC) 10/27/2020   Essential hypertension 10/24/2020   B12 deficiency 10/24/2020   Arrhythmia    Kidney stone    Inflammatory polyarthritis (HCC) 09/29/2020   Migraine headache 01/04/2019   New daily persistent headache 11/25/2017   Moderate right ankle sprain 10/15/2016   PAC (premature atrial contraction) 10/09/2015   PVC (premature ventricular contraction) 10/09/2015   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   Bipolar 1 disorder (HCC) 08/21/2013   Insomnia 08/21/2013   Past Medical History:  Diagnosis Date   Arrhythmia    Bipolar 1 disorder (HCC) 08/21/2013   Jaundice, neonatal    Kidney stone    Prediabetes 08/19/2021   Premature birth    37 weeks   Past Surgical History:  Procedure Laterality Date   TONSILLECTOMY  2009   Social History   Tobacco Use   Smoking status: Never   Smokeless tobacco: Never  Vaping Use   Vaping status: Never Used  Substance Use Topics   Alcohol use: No   Drug use: No      ROS: as noted in HPI  Objective:     BP 125/78   Pulse 94   Resp 17   Ht 5' 6 (1.676 m)   Wt 228 lb 12 oz (103.8 kg)   SpO2 100%   BMI 36.92 kg/m  BP Readings from Last 3 Encounters:  12/30/23 125/78  12/08/23 130/68  11/24/23 131/67   Wt Readings from Last  3 Encounters:  12/30/23 228 lb 12 oz (103.8 kg)  12/08/23 226 lb 0.6 oz (102.5 kg)  11/24/23 230 lb 12.8 oz (104.7 kg)      Physical Exam Vitals and nursing note reviewed.  Constitutional:      General: She is not in acute distress.    Appearance: Normal appearance. She is not ill-appearing, toxic-appearing or diaphoretic.  HENT:     Head: Normocephalic.     Right Ear: External ear normal.     Left Ear: External ear normal.     Nose:     Right Turbinates: Not enlarged or swollen.     Left Turbinates: Not enlarged or swollen.     Right Sinus: No maxillary sinus tenderness or frontal sinus tenderness.     Left Sinus: No maxillary sinus tenderness or frontal sinus  tenderness.      Comments: No abnormalities noted within the nasal passage/ nare Cardiovascular:     Rate and Rhythm: Normal rate.  Pulmonary:     Effort: Pulmonary effort is normal. No respiratory distress.  Skin:    General: Skin is warm and dry.  Neurological:     Mental Status: She is alert and oriented to person, place, and time.      No results found for any visits on 12/30/23.    The ASCVD Risk score (Arnett DK, et al., 2019) failed to calculate for the following reasons:   The 2019 ASCVD risk score is only valid for ages 55 to 18  Assessment & Plan:  Infected nasal piercing -     Mupirocin ; Apply to affected area TID for 7 days.  Dispense: 30 g; Refill: 3 -     Clindamycin  Phosphate; Apply 1 application  topically 2 (two) times daily.  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 Not related to piercing material. Oral antibiotics not preferred due to side effects. Mupirocin  recommended for MRSA coverage. Expected resolution in 5-7 days. - Prescribed mupirocin  topical antibiotic, apply three times daily. - Instructed to use warm compresses with warm water and a washcloth. - Recommended Vicci and Johnson baby shampoo for gentle scrubbing. - Advised to prevent pressure on the area by using glasses that do not touch the site. - Suggested using a strap or things behind the ears to prevent glasses from touching the area until healed.  - it sx persist after one week, switch to clindamycin  wipes BID        No follow-ups on file.   Benton LITTIE Gave, PA

## 2023-12-30 NOTE — Patient Instructions (Signed)
 Use a warm moist washcloth with Tonya Clark and Tonya Clark baby shampoo. Use as a compress and cleanse the area three times daily. After each compress, please apply topical mupirocin  ointment.  If in 5-7 days the area is still irritated, then start using the topical clindamycin  swabs. You can use these twice daily as needed.  Please ensure that your glasses do not rub or irritate the new piercing.

## 2024-01-01 ENCOUNTER — Encounter: Payer: Self-pay | Admitting: Urgent Care

## 2024-02-16 ENCOUNTER — Encounter: Payer: Self-pay | Admitting: Family Medicine

## 2024-02-20 NOTE — Telephone Encounter (Signed)
 Skippy, can you call [facility] under  and just see what their wait time is to get in with a provider that can do med management not just a therapist.  And then let patient know what you find out.

## 2024-02-27 ENCOUNTER — Encounter: Payer: Self-pay | Admitting: Family Medicine

## 2024-03-08 DIAGNOSIS — K8020 Calculus of gallbladder without cholecystitis without obstruction: Secondary | ICD-10-CM | POA: Insufficient documentation

## 2024-03-11 ENCOUNTER — Encounter: Payer: Self-pay | Admitting: Family Medicine

## 2024-04-23 ENCOUNTER — Ambulatory Visit: Payer: Self-pay

## 2024-04-23 NOTE — Telephone Encounter (Signed)
 FYI Only or Action Required?: Action required by provider: update on patient condition.  Patient was last seen in primary care on 12/30/2023 by Tonya Benton CROME, PA.  Called Nurse Triage reporting Dizziness.  Symptoms began several weeks ago.  Interventions attempted: Prescription medications: Doxepin and Rest, hydration, or home remedies.  Symptoms are: gradually worsening.  Triage Disposition: See Physician Within 24 Hours  Patient/caregiver understands and will follow disposition?: Yes  Copied from CRM #[id number]. Topic: Clinical - Red Word Triage >> Apr 23, 2024  4:08 PM Mercer PEDLAR wrote: Red Word that prompted transfer to Nurse Triage: Lightheadedness episodes daily starting with ears ringing and gets shaky and dizzy after that with irregular heartbeat. Reason for Disposition  [1] MODERATE dizziness (e.g., interferes with normal activities) AND [2] has NOT been evaluated by doctor (or NP/PA) for this  (Exception: Dizziness caused by heat exposure, sudden standing, or poor fluid intake.)  Answer Assessment - Initial Assessment Questions Patient with increased frequency of nighttime dizzy spells since having her gallbladder removed 10/17. Previously they were 1-2xmonth and now nightly. She also started Doxepin the same time. She has them at night only when waling her dog. Ears start ringing, her hearing slowly fades and gets dizzy. She knows that when her ears ring to sit down. She denies falling. Recovers in about 5 minutes max and then continues to walk her dog. Feels out of it for a few minutes after.  Denies seizure history. Has BP cuff at parents house. Had bariatric surgery in the past so unsure if blood sugar related.  Patient will keep journal of symptoms. She will get BP cuff from parents and take it prior to walking her dog, wear her watch to check HR. She will eat a snack prior to going to ensure blood sugar regulated. BH provider did not think the Doxepin could be causing symptoms.  Dizziness is one of the common side effects. Advised to stop medication to see if improvement- only on 3mg  so low dose.  Appt with PCP Thursday per pt schedule and her request. ED/UC precautions advised.   Gallbladder 10/17 and started Doxepin 3mg  that day as well  1. DESCRIPTION: Describe your dizziness.     Ears start ringing, hearing fades, then light headed,  2. LIGHTHEADED: Do you feel lightheaded? (e.g., somewhat faint, woozy, weak upon standing)     Light headed, out of in, disconnected 3. VERTIGO: Do you feel like either you or the room is spinning or tilting? (i.e., vertigo)     Denies spinning  4. SEVERITY: How bad is it?  Do you feel like you are going to faint? Can you stand and walk?     Has been able to sit or catch herself before passing out.  5. ONSET:  When did the dizziness begin?     started 6. AGGRAVATING FACTORS: Does anything make it worse? (e.g., standing, change in head position)     Standing and walking the dog at night  7. HEART RATE: Can you tell me your heart rate? How many beats in 15 seconds?  (Note: Not all patients can do this.)       Jumps up- chest feels weird 8. CAUSE: What do you think is causing the dizziness? (e.g., decreased fluids or food, diarrhea, emotional distress, heat exposure, new medicine, sudden standing, vomiting; unknown)     Started new medication Doxepin  9. RECURRENT SYMPTOM: Have you had dizziness before? If Yes, ask: When was the last time? What happened that  time?     Would happen twice a month but since surgery/starting meds 10. OTHER SYMPTOMS: Do you have any other symptoms? (e.g., fever, chest pain, vomiting, diarrhea, bleeding)       Light headed, ears ringing, shaky.  11. PREGNANCY: Is there any chance you are pregnant? When was your last menstrual period?       Denies  Protocols used: Dizziness - Lightheadedness-A-AH

## 2024-04-24 NOTE — Telephone Encounter (Signed)
 Patient shows scheduled for 04/26/2024 with Dr. Alvan

## 2024-04-24 NOTE — Telephone Encounter (Signed)
 Spoke with patient. 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  She will keep upcoming appt scheduled for 04/26/2024

## 2024-04-24 NOTE — Telephone Encounter (Signed)
 I hjave appt at 10:10 if she would like to be seen today

## 2024-04-24 NOTE — Telephone Encounter (Signed)
 Attempted call to patient. Left a voice mail ( detailed ) that if return call received in time that there is a 10:10 appt open this morning with Dr. Alvan .

## 2024-04-26 ENCOUNTER — Ambulatory Visit: Admitting: Family Medicine

## 2024-04-26 ENCOUNTER — Encounter: Payer: Self-pay | Admitting: Family Medicine

## 2024-04-26 VITALS — BP 141/81 | HR 72 | Ht 66.0 in | Wt 228.0 lb

## 2024-04-26 DIAGNOSIS — Z23 Encounter for immunization: Secondary | ICD-10-CM

## 2024-04-26 DIAGNOSIS — R42 Dizziness and giddiness: Secondary | ICD-10-CM | POA: Diagnosis not present

## 2024-04-26 DIAGNOSIS — I10 Essential (primary) hypertension: Secondary | ICD-10-CM | POA: Diagnosis not present

## 2024-04-26 MED ORDER — DILTIAZEM HCL ER COATED BEADS 120 MG PO CP24: 120.0000 mg | ORAL_CAPSULE | Freq: Every day | ORAL | 1 refills | Status: DC

## 2024-04-26 NOTE — Patient Instructions (Signed)
 Increase fluids and try lower dose cardizem   If not noticing much improvement then I want you to try eating a protein snack about an hour before bedtime and see if that makes a difference.  And if you are still not improving then please let me know.  If you are able to check your blood pressure when this occurs then feel free to send me those numbers over MyChart.

## 2024-04-26 NOTE — Progress Notes (Signed)
 Acute Office Visit  Patient ID: Tonya Clark, female    DOB: March 15, 1996, 28 y.o.   MRN: [id number]  PCP: Alvan Dorothyann BIRCH, MD  Chief Complaint  Patient presents with   Dizziness    Subjective:     HPI  Discussed the use of AI scribe software for clinical note transcription with the patient, who gave verbal consent to proceed.  History of Present Illness Tonya Clark is a 28 year old female who presents with dizziness.  Dizziness and associated symptoms - Dizziness has worsened since cholecystectomy on March 09, 2024. - Prior to surgery, dizziness occurred sporadically (2-3 times per month); now occurs nightly. - Episodes characterized by tinnitus, temporary hearing loss, and sensation of impending syncope. - Symptoms frequently occur in the middle of the night when taking her dog outside; sometimes requires sitting on the ground to prevent falling. - Occasional extension of dizziness into the morning; two instances in the past week with milder morning symptoms.  Medication use - Currently taking doxepin for sleep, started around the time of surgery. - Prazosin taken at night for nightmares. - Diltiazem  use discussed with bariatric surgeon regarding possible dose reduction or discontinuation.  Hydration status - Difficulty maintaining adequate hydration. - Struggles to drink sufficient water; sometimes uses Gatorade Zero. - Fluid intake is less than needed.  Family history of autonomic dysfunction - Family history of postural orthostatic tachycardia syndrome (POTS); aunt has severe case.  Postoperative status and laboratory findings - No recent blood work indicating anemia post-cholecystectomy. - Iron levels typically on the lower side.   ROS     Objective:    BP (!) 141/81   Pulse 72   Ht 5' 6 (1.676 m)   Wt 228 lb (103.4 kg)   SpO2 99%   BMI 36.80 kg/m     Physical Exam Vitals and nursing note reviewed.  Constitutional:      Appearance:  Normal appearance.  HENT:     Head: Normocephalic and atraumatic.  Eyes:     Conjunctiva/sclera: Conjunctivae normal.  Cardiovascular:     Rate and Rhythm: Normal rate and regular rhythm.  Pulmonary:     Effort: Pulmonary effort is normal.     Breath sounds: Normal breath sounds.  Skin:    General: Skin is warm and dry.  Neurological:     Mental Status: She is alert.  Psychiatric:        Mood and Affect: Mood normal.   Orthostatics were normal.    No results found for any visits on 04/26/24.     Assessment & Plan:   Problem List Items Addressed This Visit       Cardiovascular and Mediastinum   Essential hypertension   Relevant Medications   diltiazem  (CARDIZEM  CD) 120 MG 24 hr capsule   Other Visit Diagnoses       Immunization due    -  Primary   Relevant Orders   Flu vaccine trivalent PF, 6mos and older(Flulaval,Afluria,Fluarix,Fluzone) (Completed)     Lightheaded           Assessment and Plan Assessment & Plan Dizziness and presyncope episodes Increased frequency post-cholecystectomy and doxepin initiation. Symptoms suggest orthostatic hypotension, hypoglycemia, or cardiac issues. Elevated office BP, possible hypotension from diltiazem  and prazosin. Suboptimal hydration noted. - Decreased diltiazem  dosage, monitor symptoms. - Increase fluid intake during the day. - Monitor home blood pressure during episodes. - Consider cardiac ultrasound if symptoms persist. - Consider protein snack at bedtime if symptoms persist. -  consider labs to eval for anemia post op if not better after the weekend.    Essential hypertension BP management complicated by dizziness and presyncope. Diltiazem  and prazosin may contribute to hypotension. Recent surgery may affect BP dynamics. - Adjusted diltiazem  dosage to lower strength. - Monitor home blood pressure, especially during dizziness and presyncope. - Reassess BP control and symptoms post-medication adjustment.    Meds  ordered this encounter  Medications   diltiazem  (CARDIZEM  CD) 120 MG 24 hr capsule    Sig: Take 1 capsule (120 mg total) by mouth daily.    Dispense:  30 capsule    Refill:  1    In place of the 240mg     Return if symptoms worsen or fail to improve.  Dorothyann Byars, MD [facility] at [facility]

## 2024-04-27 ENCOUNTER — Encounter: Payer: Self-pay | Admitting: Family Medicine

## 2024-04-27 ENCOUNTER — Ambulatory Visit: Payer: Self-pay

## 2024-04-27 ENCOUNTER — Ambulatory Visit: Admitting: Family Medicine

## 2024-04-27 VITALS — BP 133/80 | HR 93 | Temp 98.1°F | Ht 66.0 in | Wt 228.0 lb

## 2024-04-27 DIAGNOSIS — J029 Acute pharyngitis, unspecified: Secondary | ICD-10-CM | POA: Diagnosis not present

## 2024-04-27 LAB — POCT RAPID STREP A (OFFICE): Rapid Strep A Screen: NEGATIVE

## 2024-04-27 MED ORDER — METHYLPREDNISOLONE 4 MG PO TBPK: ORAL_TABLET | ORAL | 0 refills | Status: DC

## 2024-04-27 NOTE — Assessment & Plan Note (Signed)
 Negative POC rapid strep.  Recommend supportive care with increased fluids, warm salt water gargles and OTC analgesics.  Adding course of steroids as well.  Red flags reviewed.  Follow up for worsening symptoms.

## 2024-04-27 NOTE — Progress Notes (Signed)
 Tonya Clark - 28 y.o. female MRN [id number]  Date of birth: 1995/07/29  Subjective Chief Complaint  Patient presents with   Sore Throat    HPI Tonya Clark is a 28 y.o. SABRAhere today with complaint of sore throat and mild hoarseness.  She did notice mild headache this morning as well.  Symptoms started last night.   She has not had fever or chills.  She has not had any other symptoms including congestion, cough or drainage.    ROS:  A comprehensive ROS was completed and negative except as noted per HPI  Allergies  Allergen Reactions   Lamictal  [Lamotrigine ] Rash   Trazodone  And Nefazodone Other (See Comments)    Pt states, sleep paralysis   Other Other (See Comments)    Patient had an infusion banana bag then became flushed, short of breath and nausea.   Cymbalta [Duloxetine Hcl] Other (See Comments)    Suicidal ideation   Definity  [Perflutren  Lipid Microsphere] Nausea And Vomiting and Other (See Comments)    Nauseated, felt hot all over, with tingling in throat, tongue, hands and feet   Gabapentin  Other (See Comments)    Hair Loss and weight gain   Nsaids Other (See Comments)    S/p bariatric surgery .     Past Medical History:  Diagnosis Date   Arrhythmia    Bipolar 1 disorder (HCC) 08/21/2013   Jaundice, neonatal    Kidney stone    Prediabetes 08/19/2021   Premature birth    37 weeks    Past Surgical History:  Procedure Laterality Date   TONSILLECTOMY  2009    Social History   Socioeconomic History   Marital status: Divorced    Spouse name: Velma   Number of children: Not on file   Years of education: Not on file   Highest education level: Bachelor's degree (e.g., BA, AB, BS)  Occupational History   Occupation: Student  Tobacco Use   Smoking status: Never   Smokeless tobacco: Never  Vaping Use   Vaping status: Never Used  Substance and Sexual Activity   Alcohol use: No   Drug use: No   Sexual activity: Yes    Birth control/protection: None  Other  Topics Concern   Not on file  Social History Narrative   Was home schooled for HS. She is now working at a [employer] 2-3 times per week. Born in [place].      Lives with husband   Right handed   Caffeine: 45mg  of coke zero a day          Social Drivers of Health   Financial Resource Strain: Patient Declined (05/26/2023)   Received from [facility]   Overall Financial Resource Strain (CARDIA)    Difficulty of Paying Living Expenses: Patient declined  Food Insecurity: Patient Declined (03/09/2024)   Received from [facility]   Hunger Vital Sign    Within the past 12 months, you worried that your food would run out before you got the money to buy more.: Patient declined    Within the past 12 months, the food you bought just didn't last and you didn't have money to get more.: Patient declined  Transportation Needs: Patient Declined (03/09/2024)   Received from [facility] - Transportation    In the past 12 months, has lack of transportation kept you from medical appointments or from getting medications?: Patient declined    In the past 12 months, has lack of transportation  kept you from meetings, work, or from getting things needed for daily living?: Patient declined  Physical Activity: Inactive (12/20/2022)   Exercise Vital Sign    Days of Exercise per Week: 0 days    Minutes of Exercise per Session: 0 min  Stress: No Stress Concern Present (04/18/2023)   Received from [facility] - Occupational Stress Questionnaire    Feeling of Stress : Only a little  Social Connections: Socially Integrated (10/14/2022)   Received from [facility]   Social Network    How would you rate your social network (family, work, friends)?: Good participation with social networks    Family History  Problem Relation Age of Onset   Hypertension Mother    Allergies Mother    Anxiety disorder Father    Hypertension Father      Health Maintenance  Topic Date Due   HIV Screening  Never done   Hepatitis C Screening  Never done   Cervical Cancer Screening (Pap smear)  11/13/2022   DTaP/Tdap/Td (8 - Td or Tdap) 11/24/2026   Influenza Vaccine  Completed   Hepatitis B Vaccines 19-59 Average Risk  Completed   HPV VACCINES  Completed   Pneumococcal Vaccine  Aged Out   Meningococcal B Vaccine  Aged Out   COVID-19 Vaccine  Discontinued     ----------------------------------------------------------------------------------------------------------------------------------------------------------------------------------------------------------------- Physical Exam BP 133/80 (BP Location: Left Arm, Patient Position: Sitting, Cuff Size: Large)   Pulse 93   Temp 98.1 F (36.7 C) (Oral)   Ht 5' 6 (1.676 m)   Wt 228 lb (103.4 kg)   SpO2 98%   BMI 36.80 kg/m   Physical Exam Constitutional:      Appearance: Normal appearance.  HENT:     Head: Normocephalic and atraumatic.     Mouth/Throat:     Mouth: Mucous membranes are moist.     Pharynx: Posterior oropharyngeal erythema present. No oropharyngeal exudate.  Cardiovascular:     Rate and Rhythm: Normal rate and regular rhythm.  Pulmonary:     Effort: Pulmonary effort is normal.     Breath sounds: Normal breath sounds.  Lymphadenopathy:     Cervical: Cervical adenopathy present.  Neurological:     General: No focal deficit present.     Mental Status: She is alert.     ------------------------------------------------------------------------------------------------------------------------------------------------------------------------------------------------------------------- Assessment and Plan  Pharyngitis Negative POC rapid strep.  Recommend supportive care with increased fluids, warm salt water gargles and OTC analgesics.  Adding course of steroids as well.  Red flags reviewed.  Follow up for worsening symptoms.    Meds ordered this encounter   Medications   methylPREDNISolone  (MEDROL  DOSEPAK) 4 MG TBPK tablet    Sig: Taper as directed on packaging.    Dispense:  21 tablet    Refill:  0    No follow-ups on file.

## 2024-04-27 NOTE — Patient Instructions (Signed)

## 2024-04-27 NOTE — Telephone Encounter (Signed)
 FYI Only or Action Required?: FYI only for provider: appointment scheduled on This morning.  Patient was last seen in primary care on 04/26/2024 by Alvan Dorothyann BIRCH, MD.  Called Nurse Triage reporting Sore Throat. - loss of voice - does not feel well  Symptoms began yesterday.  Interventions attempted: Nothing.  Symptoms are: gradually worsening.  Triage Disposition: See Physician Within 24 Hours  Patient/caregiver understands and will follow disposition?: Yes                      Copied from CRM #[id number]. Topic: Clinical - Red Word Triage >> Apr 27, 2024  8:21 AM Ber[place] MATSU wrote: Red Word that prompted transfer to Nurse Triage: lost voice/ painful sore throat. Body aches, no fever, over all not feeling well, Reason for Disposition  SEVERE throat pain (e.g., excruciating)  Answer Assessment - Initial Assessment Questions 1. ONSET: When did the throat start hurting? (Hours or days ago)      yesterday 2. SEVERITY: How bad is the sore throat? (Scale 1-10; mild, moderate or severe)     Pain - 5/10 3. STREP EXPOSURE: Has there been any exposure to strep within the past week? If Yes, ask: What type of contact occurred?      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  VIRAL SYMPTOMS: Are there any symptoms of a cold, such as a runny nose, cough, hoarse voice or red eyes?      Nose is runny 5. FEVER: Do you have a fever? If Yes, ask: What is your temperature, how was it measured, and when did it start?     no 6. PUS ON THE TONSILS: Is there pus on the tonsils in the back of your throat?     Tonsils removed 7. OTHER SYMPTOMS: Do you have any other symptoms? (e.g., difficulty breathing, headache, rash)     Loss of voice, Body aches - does not feel well  Protocols used: Sore Throat-A-AH

## 2024-05-02 ENCOUNTER — Other Ambulatory Visit: Payer: Self-pay | Admitting: Family Medicine

## 2024-05-02 DIAGNOSIS — Z760 Encounter for issue of repeat prescription: Secondary | ICD-10-CM

## 2024-05-08 ENCOUNTER — Other Ambulatory Visit: Payer: Self-pay | Admitting: Family Medicine

## 2024-05-08 DIAGNOSIS — Z3009 Encounter for other general counseling and advice on contraception: Secondary | ICD-10-CM

## 2024-06-07 ENCOUNTER — Encounter: Payer: Self-pay | Admitting: Family Medicine

## 2024-06-07 ENCOUNTER — Ambulatory Visit: Admitting: Family Medicine

## 2024-06-07 VITALS — BP 129/84 | HR 78 | Ht 66.0 in | Wt 226.0 lb

## 2024-06-07 DIAGNOSIS — I10 Essential (primary) hypertension: Secondary | ICD-10-CM | POA: Diagnosis not present

## 2024-06-07 DIAGNOSIS — G43709 Chronic migraine without aura, not intractable, without status migrainosus: Secondary | ICD-10-CM | POA: Diagnosis not present

## 2024-06-07 DIAGNOSIS — R7301 Impaired fasting glucose: Secondary | ICD-10-CM

## 2024-06-07 DIAGNOSIS — R55 Syncope and collapse: Secondary | ICD-10-CM

## 2024-06-07 MED ORDER — RIZATRIPTAN BENZOATE 10 MG PO TBDP: 10.0000 mg | ORAL_TABLET | ORAL | 11 refills | Status: AC | PRN

## 2024-06-07 NOTE — Progress Notes (Signed)
 "  Established Patient Office Visit  Patient ID: Tonya Clark, female    DOB: September 12, 1995  Age: 29 y.o. MRN: [id number] PCP: Alvan Dorothyann BIRCH, MD  Chief Complaint  Patient presents with   Medical Management of Chronic Issues    Subjective:     HPI  Discussed the use of AI scribe software for clinical note transcription with the patient, who gave verbal consent to proceed.  History of Present Illness Tonya Clark is a 29 year old female with hypertension and migraines who presents for follow-up on blood pressure management and migraine control.  Migraine headaches - Increase in migraine frequency recently - Rizatriptan  effective in alleviating symptoms - Emgality  effective for migraine prevention, but uncertain about current supply and insurance coverage - Has not started Topamax  due to concerns about side effects, particularly altered taste of carbonated drinks - Considering starting Topamax  once able to tolerate water better  Syncope and presyncope - Episode of near syncope at 2 AM with transient hearing loss - Required sitting on the curb until symptoms resolved - Hearing described as 'TV static' for approximately 30 minutes - History of similar episodes in the past, previously resolved with medication adjustments - No prior 'about to pass out' episodes since starting Cardizem  until this recent incident  Fatigue - Significant fatigue throughout the week  Hypertension - Currently taking Cardizem  for blood pressure management - Monitors blood pressure at home with a blood pressure cuff - Blood pressure typically higher in office settings  Medication access and cost - Significant increase in medication costs, with least expensive medication now $30 and some as high as $225 - Vyvanse is one of the more expensive medications at $75 - One psychiatric medication has become unaffordable  Weight loss - Has lost over 100 pounds     ROS    Objective:     BP  129/84   Pulse 78   Ht 5' 6 (1.676 m)   Wt 226 lb (102.5 kg)   SpO2 99%   BMI 36.48 kg/m    Physical Exam Vitals reviewed.  Constitutional:      Appearance: Normal appearance.  HENT:     Head: Normocephalic.  Pulmonary:     Effort: Pulmonary effort is normal.  Neurological:     Mental Status: She is alert and oriented to person, place, and time.  Psychiatric:        Mood and Affect: Mood normal.        Behavior: Behavior normal.      No results found for any visits on 06/07/24.    The ASCVD Risk score (Arnett DK, et al., 2019) failed to calculate for the following reasons:   The 2019 ASCVD risk score is only valid for ages 30 to 63   * - Cholesterol units were assumed    Assessment & Plan:   Problem List Items Addressed This Visit       Cardiovascular and Mediastinum   Migraine headache   Chronic migraine without aura Increased migraine frequency managed with Rizatriptan . Stable insurance coverage for Emgality , but future prior authorization may be needed. - Continue Rizatriptan  for acute migraine management. - Monitor insurance coverage for Emgality  and notify provider if prior authorization is needed.      Relevant Medications   rizatriptan  (MAXALT -MLT) 10 MG disintegrating tablet   Essential hypertension - Primary   Essential hypertension Presyncope episode likely related to Diltiazem  dosage. Weight loss surgery considerations. - STOP Diltiazem  dosage. Already on lowest dose.  -  Monitor blood pressure at home, especially during presyncope episodes.        Endocrine   IFG (impaired fasting glucose)   Lab Results  Component Value Date   HGBA1C 5.2 09/08/2023   A1c's have stayed consistently under 5.7 since having her bariatric surgery.        Other   Morbid obesity (HCC)   BMI now down to 36 she is doing absolutely phenomenally well.  In fact we are stopping blood pressure medication today so hopefully will be able to continue to hold off on  that.  She has not started the topiramate  yet which was prescribed by the bariatric surgeon for more for appetite suppression.  Again she has been doing great on her own.      Other Visit Diagnoses       Near syncope           Assessment and Plan Assessment & Plan   Near syncope-see note above in regard to Stopping the diltiazem .   Return in about 6 months (around 12/05/2024) for Migraines .    Dorothyann Byars, MD [facility] at [facility]   "

## 2024-06-08 ENCOUNTER — Encounter: Payer: Self-pay | Admitting: Family Medicine

## 2024-06-08 NOTE — Assessment & Plan Note (Signed)
 Essential hypertension Presyncope episode likely related to Diltiazem  dosage. Weight loss surgery considerations. - STOP Diltiazem  dosage. Already on lowest dose.  - Monitor blood pressure at home, especially during presyncope episodes.

## 2024-06-08 NOTE — Assessment & Plan Note (Signed)
 Chronic migraine without aura Increased migraine frequency managed with Rizatriptan . Stable insurance coverage for Emgality , but future prior authorization may be needed. - Continue Rizatriptan  for acute migraine management. - Monitor insurance coverage for Emgality  and notify provider if prior authorization is needed.

## 2024-06-08 NOTE — Assessment & Plan Note (Signed)
 Lab Results  Component Value Date   HGBA1C 5.2 09/08/2023   A1c's have stayed consistently under 5.7 since having her bariatric surgery.

## 2024-06-08 NOTE — Assessment & Plan Note (Signed)
 BMI now down to 36 she is doing absolutely phenomenally well.  In fact we are stopping blood pressure medication today so hopefully will be able to continue to hold off on that.  She has not started the topiramate  yet which was prescribed by the bariatric surgeon for more for appetite suppression.  Again she has been doing great on her own.

## 2024-06-11 ENCOUNTER — Other Ambulatory Visit: Payer: Self-pay | Admitting: Urgent Care

## 2024-06-11 ENCOUNTER — Ambulatory Visit: Admitting: Urgent Care

## 2024-06-11 ENCOUNTER — Ambulatory Visit: Payer: Self-pay

## 2024-06-11 ENCOUNTER — Encounter: Payer: Self-pay | Admitting: Urgent Care

## 2024-06-11 VITALS — BP 116/80 | HR 104 | Temp 99.6°F | Ht 66.0 in | Wt 223.0 lb

## 2024-06-11 DIAGNOSIS — R059 Cough, unspecified: Secondary | ICD-10-CM

## 2024-06-11 DIAGNOSIS — B349 Viral infection, unspecified: Secondary | ICD-10-CM

## 2024-06-11 DIAGNOSIS — R6889 Other general symptoms and signs: Secondary | ICD-10-CM

## 2024-06-11 LAB — POC COVID19/FLU A&B COMBO
Covid Antigen, POC: NEGATIVE
Influenza A Antigen, POC: NEGATIVE
Influenza B Antigen, POC: NEGATIVE

## 2024-06-11 MED ORDER — XOFLUZA (80 MG DOSE) 1 X 80 MG PO TBPK: 1.0000 | ORAL_TABLET | Freq: Once | ORAL | 0 refills | Status: AC
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
## 2024-06-11 NOTE — Telephone Encounter (Signed)
 Called [facility] spoke with Jon advised decline ER and request for office visit.

## 2024-06-11 NOTE — Patient Instructions (Signed)
 Your covid and flu test are negative. However given exposures, I recommend treatment for Flu.  Take a single tablet of Xofluza  x 1 dose.  In the meantime, please take 800mg  ibuprofen alternating with 1000mg  tylenol  every 4 hours. This will help with body aches, headache and help break the fever.  Please maintain hydration - your fluid needs increase dramatically when you have a fever. Please drink WATER!  Purchase OTC Oscillococcinum. This helps with body aches. Consider OTC Quercetin 500mg  with zinc 50mg  -this boosts your bodies own natural immunity.

## 2024-06-11 NOTE — Telephone Encounter (Signed)
 Patient scheduled.

## 2024-06-11 NOTE — Progress Notes (Signed)
 "  Established Patient Office Visit  Subjective:  Patient ID: Tonya Clark, female    DOB: 05/06/1996  Age: 29 y.o. MRN: [id number]  Chief Complaint  Patient presents with   Cough    Congestion, headache, body ache, diarrhea since yesterday    Cough    Discussed the use of AI scribe software for clinical note transcription with the patient, who gave verbal consent to proceed.  History of Present Illness   Tonya Clark is a 29 year old female who presents with worsening body aches, congestion, sore throat, and headache.  Symptoms began yesterday morning with congestion, sore throat, and headache, which progressively worsened throughout the day. The onset was sudden, with symptoms intensifying quickly.  She experiences severe body aches, described as the worst part of her symptoms, affecting her sleep and daily activities. The aches are generalized, affecting her arms, legs, hands, and toes, making it difficult to walk her dog or climb stairs.  She has a history of migraines and was unable to take her usual migraine injection due to a delay in her order and inability to access her rescue medication at school. She managed to take the medication today, which reduced her headache from a migraine to a regular headache.  Gastrointestinal symptoms include diarrhea, which is not uncommon due to her history of weight loss surgery. She experiences nausea intermittently, which she cannot distinguish from her usual pattern.  She mentions feeling subjectively hot and sweaty or extremely cold, despite not typically running fevers. Her temperature was recorded at 99.62F, which is higher than her usual range in the 97s. She did take OTC medication prior to her visit.  No shortness of breath, but she describes a sensation of chest heaviness and discomfort with deep breathing, though not painful. She also reports ear discomfort and sinus-like pain, particularly around her glasses.  She took DayQuil at  10 AM today to manage her symptoms. 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      Patient Active Problem List   Diagnosis Date Noted   Calculus of gallbladder without cholecystitis without obstruction 03/08/2024   S/P bariatric surgery 09/08/2023   Keratosis pilaris 03/11/2023   Sleep walking 04/05/2022   OSA (obstructive sleep apnea) 02/27/2021   IFG (impaired fasting glucose) 01/19/2021   Snoring 12/23/2020   Excessive daytime sleepiness 12/23/2020   Fibromyalgia 12/01/2020   Sinus tachycardia 10/27/2020   Morbid obesity (HCC) 10/27/2020   Essential hypertension 10/24/2020   B12 deficiency 10/24/2020   Arrhythmia    Kidney stone    Inflammatory polyarthritis (HCC) 09/29/2020   Migraine headache 01/04/2019   New daily persistent headache 11/25/2017   Moderate right ankle sprain 10/15/2016   PAC (premature atrial contraction) 10/09/2015   PVC (premature ventricular contraction) 10/09/2015   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   Bipolar 1 disorder (HCC) 08/21/2013   Insomnia 08/21/2013   Past Medical History:  Diagnosis Date   Arrhythmia    Bipolar 1 disorder (HCC) 08/21/2013   Jaundice, neonatal    Kidney stone    Prediabetes 08/19/2021   Premature birth    37 weeks   Past Surgical History:  Procedure Laterality Date   TONSILLECTOMY  2009   Social History[1]    ROS: as noted in HPI  Objective:     BP 116/80   Pulse (!) 104   Temp 99.6 F (37.6 C)   Ht 5' 6 (1.676 m)  Wt 223 lb (101.2 kg)   SpO2 98%   BMI 35.99 kg/m  BP Readings from Last 3 Encounters:  06/11/24 116/80  06/07/24 129/84  04/27/24 133/80   Wt Readings from Last 3 Encounters:  06/11/24 223 lb (101.2 kg)  06/07/24 226 lb (102.5 kg)  04/27/24 228 lb (103.4 kg)      Physical Exam Vitals and nursing note reviewed. Exam conducted with a chaperone present.  Constitutional:      General: She is not in acute distress.     Appearance: Normal appearance. She is ill-appearing. She is not toxic-appearing or diaphoretic.  HENT:     Head: Normocephalic and atraumatic.     Right Ear: Tympanic membrane, ear canal and external ear normal. There is no impacted cerumen.     Left Ear: Tympanic membrane, ear canal and external ear normal. There is no impacted cerumen.     Nose: Congestion and rhinorrhea present. Rhinorrhea is clear.     Right Turbinates: Not enlarged or swollen.     Left Turbinates: Not enlarged or swollen.     Right Sinus: No maxillary sinus tenderness or frontal sinus tenderness.     Left Sinus: No maxillary sinus tenderness or frontal sinus tenderness.     Mouth/Throat:     Lips: Pink.     Mouth: Mucous membranes are moist.     Pharynx: Oropharynx is clear. Uvula midline. No pharyngeal swelling, oropharyngeal exudate, posterior oropharyngeal erythema or uvula swelling.  Eyes:     General: No scleral icterus.       Right eye: No discharge.        Left eye: No discharge.     Extraocular Movements: Extraocular movements intact.     Pupils: Pupils are equal, round, and reactive to light.  Cardiovascular:     Rate and Rhythm: Regular rhythm. Tachycardia present.     Heart sounds: No murmur heard. Pulmonary:     Effort: Pulmonary effort is normal. No respiratory distress.     Breath sounds: Normal breath sounds. No stridor. No wheezing or rhonchi.  Musculoskeletal:     Cervical back: Normal range of motion and neck supple. No rigidity.  Lymphadenopathy:     Cervical: Cervical adenopathy (L side) present.  Skin:    General: Skin is warm and dry.     Findings: No erythema or rash.  Neurological:     General: No focal deficit present.     Mental Status: She is alert and oriented to person, place, and time.  Psychiatric:        Behavior: Behavior normal.      Results for orders placed or performed in visit on 06/11/24  POC Covid19/Flu A&B Antigen  Result Value Ref Range   Influenza A Antigen,  POC Negative Negative   Influenza B Antigen, POC Negative Negative   Covid Antigen, POC Negative Negative    Last CBC Lab Results  Component Value Date   WBC 9.0 09/08/2023   HGB 13.0 09/08/2023   HCT 41.0 09/08/2023   MCV 83 09/08/2023   MCH 26.4 (L) 09/08/2023   RDW 14.4 09/08/2023   PLT 324 09/08/2023   Last metabolic panel Lab Results  Component Value Date   GLUCOSE 91 09/08/2023   NA 138 09/08/2023   K 4.5 09/08/2023   CL 101 09/08/2023   CO2 23 09/08/2023   BUN 16 09/08/2023   CREATININE 0.87 09/08/2023   EGFR 94 09/08/2023   CALCIUM 9.4 09/08/2023   PROT 7.0  09/08/2023   ALBUMIN 4.2 09/08/2023   LABGLOB 2.8 09/08/2023   BILITOT <0.2 09/08/2023   ALKPHOS 99 09/08/2023   AST 12 09/08/2023   ALT 16 09/08/2023   ANIONGAP 12 05/30/2017   Last lipids Lab Results  Component Value Date   CHOL 204 (H) 09/08/2023   HDL 49 09/08/2023   LDLCALC 132 (H) 09/08/2023   TRIG 129 09/08/2023   CHOLHDL 4.2 09/08/2023   Last hemoglobin A1c Lab Results  Component Value Date   HGBA1C 5.2 09/08/2023   Last thyroid  functions Lab Results  Component Value Date   TSH 0.80 07/21/2022   Last vitamin D  Lab Results  Component Value Date   VD25OH 48.1 09/08/2023   Last vitamin B12 and Folate Lab Results  Component Value Date   VITAMINB12 307 09/08/2023   FOLATE >24.0 01/19/2021      The ASCVD Risk score (Arnett DK, et al., 2019) failed to calculate for the following reasons:   The 2019 ASCVD risk score is only valid for ages 56 to 75   * - Cholesterol units were assumed  Assessment & Plan:  Cough, unspecified type -     POC Covid19/Flu A&B Antigen  Flu-like symptoms -     Xofluza  (80 MG Dose); Take 1 tablet by mouth once for 1 dose.  Dispense: 1 each; Refill: 0  Viral illness -     Xofluza  (80 MG Dose); Take 1 tablet by mouth once for 1 dose.  Dispense: 1 each; Refill: 0    Assessment and Plan    Acute upper respiratory infection Symptoms suggest viral  infection considering exposure risk and community prevalence. Flu and covid neg in office, however discussed possible tx regardless given known exposures - start xofluza  x 1 dose - otc supportive measures discussed - RTC sx reviewed       No follow-ups on file.   Benton LITTIE Gave, PA    [1]  Social History Tobacco Use   Smoking status: Never   Smokeless tobacco: Never  Vaping Use   Vaping status: Never Used  Substance Use Topics   Alcohol use: No   Drug use: No   "

## 2024-06-12 NOTE — Telephone Encounter (Signed)
 Spoke with patient, she stated that the pharmacy told her with the coupon it was going to be $100. Please advise.

## 2024-06-12 NOTE — Telephone Encounter (Signed)
 Please notify pt that I gave her the coupon card to use rather than billing insurance. I would not recommend generic tamiflu , but xofluza  only. Needs to start ASAP

## 2024-06-12 NOTE — Telephone Encounter (Unsigned)
 Copied from CRM #[id number]. Topic: Clinical - Prescription Issue >> Jun 11, 2024  4:24 PM Tonya Clark wrote: Reason for CRM: patients pharmacy states they need a updated prescription before it can be filled, please advise.   Baloxavir Marboxil ,80 MG Dose, (XOFLUZA , 80 MG DOSE,) 1 x 80 MG TBPK [[id number]]

## 2024-06-13 ENCOUNTER — Encounter: Payer: Self-pay | Admitting: Family Medicine

## 2024-06-25 ENCOUNTER — Other Ambulatory Visit: Payer: Self-pay | Admitting: Family Medicine

## 2024-06-25 DIAGNOSIS — I10 Essential (primary) hypertension: Secondary | ICD-10-CM

## 2024-12-05 ENCOUNTER — Ambulatory Visit: Admitting: Family Medicine
